# Patient Record
Sex: Female | Born: 1975
Health system: Southern US, Community
[De-identification: ages and names within clinical notes are randomized; demographics above are authoritative.]

## PROBLEM LIST (undated history)

## (undated) DIAGNOSIS — I10 Essential (primary) hypertension: Secondary | ICD-10-CM

## (undated) DIAGNOSIS — E119 Type 2 diabetes mellitus without complications: Secondary | ICD-10-CM

## (undated) DIAGNOSIS — E785 Hyperlipidemia, unspecified: Secondary | ICD-10-CM

## (undated) DIAGNOSIS — E559 Vitamin D deficiency, unspecified: Secondary | ICD-10-CM

## (undated) DIAGNOSIS — E739 Lactose intolerance, unspecified: Secondary | ICD-10-CM

## (undated) DIAGNOSIS — T7840XA Allergy, unspecified, initial encounter: Secondary | ICD-10-CM

## (undated) DIAGNOSIS — F32A Depression, unspecified: Secondary | ICD-10-CM

## (undated) DIAGNOSIS — Z86718 Personal history of other venous thrombosis and embolism: Secondary | ICD-10-CM

## (undated) HISTORY — DX: Allergy, unspecified, initial encounter: T78.40XA

## (undated) HISTORY — PX: OTHER SURGICAL HISTORY: SHX169

## (undated) HISTORY — DX: Type 2 diabetes mellitus without complications: E11.9

## (undated) HISTORY — DX: Essential (primary) hypertension: I10

## (undated) HISTORY — DX: Depression, unspecified: F32.A

## (undated) HISTORY — DX: Hyperlipidemia, unspecified: E78.5

## (undated) HISTORY — DX: Vitamin D deficiency, unspecified: E55.9

## (undated) HISTORY — DX: Lactose intolerance, unspecified: E73.9

## (undated) HISTORY — DX: Personal history of other venous thrombosis and embolism: Z86.718

---

## 1997-05-08 HISTORY — PX: OTHER SURGICAL HISTORY: SHX169

## 1997-08-11 ENCOUNTER — Other Ambulatory Visit: Admission: RE | Admit: 1997-08-11 | Discharge: 1997-08-11 | Payer: Self-pay | Admitting: *Deleted

## 1997-09-18 ENCOUNTER — Ambulatory Visit (HOSPITAL_COMMUNITY): Admission: RE | Admit: 1997-09-18 | Discharge: 1997-09-18 | Payer: Self-pay | Admitting: *Deleted

## 1997-11-16 ENCOUNTER — Emergency Department (HOSPITAL_COMMUNITY): Admission: EM | Admit: 1997-11-16 | Discharge: 1997-11-16 | Payer: Self-pay | Admitting: Internal Medicine

## 1997-12-10 ENCOUNTER — Emergency Department (HOSPITAL_COMMUNITY): Admission: EM | Admit: 1997-12-10 | Discharge: 1997-12-10 | Payer: Self-pay | Admitting: Emergency Medicine

## 1997-12-16 ENCOUNTER — Ambulatory Visit (HOSPITAL_COMMUNITY): Admission: RE | Admit: 1997-12-16 | Discharge: 1997-12-16 | Payer: Self-pay | Admitting: *Deleted

## 1998-01-30 ENCOUNTER — Inpatient Hospital Stay (HOSPITAL_COMMUNITY): Admission: AD | Admit: 1998-01-30 | Discharge: 1998-02-03 | Payer: Self-pay | Admitting: *Deleted

## 2000-01-17 ENCOUNTER — Emergency Department (HOSPITAL_COMMUNITY): Admission: EM | Admit: 2000-01-17 | Discharge: 2000-01-18 | Payer: Self-pay | Admitting: Emergency Medicine

## 2000-07-02 ENCOUNTER — Encounter: Payer: Self-pay | Admitting: Emergency Medicine

## 2000-07-02 ENCOUNTER — Emergency Department (HOSPITAL_COMMUNITY): Admission: EM | Admit: 2000-07-02 | Discharge: 2000-07-02 | Payer: Self-pay | Admitting: Emergency Medicine

## 2000-11-27 ENCOUNTER — Other Ambulatory Visit: Admission: RE | Admit: 2000-11-27 | Discharge: 2000-11-27 | Payer: Self-pay | Admitting: Obstetrics and Gynecology

## 2000-12-05 ENCOUNTER — Emergency Department (HOSPITAL_COMMUNITY): Admission: EM | Admit: 2000-12-05 | Discharge: 2000-12-05 | Payer: Self-pay | Admitting: Emergency Medicine

## 2001-01-01 ENCOUNTER — Emergency Department (HOSPITAL_COMMUNITY): Admission: EM | Admit: 2001-01-01 | Discharge: 2001-01-01 | Payer: Self-pay | Admitting: Internal Medicine

## 2001-12-24 ENCOUNTER — Other Ambulatory Visit: Admission: RE | Admit: 2001-12-24 | Discharge: 2001-12-24 | Payer: Self-pay | Admitting: Obstetrics and Gynecology

## 2002-04-21 ENCOUNTER — Emergency Department (HOSPITAL_COMMUNITY): Admission: EM | Admit: 2002-04-21 | Discharge: 2002-04-21 | Payer: Self-pay | Admitting: Emergency Medicine

## 2003-01-15 ENCOUNTER — Other Ambulatory Visit: Admission: RE | Admit: 2003-01-15 | Discharge: 2003-01-15 | Payer: Self-pay | Admitting: Obstetrics and Gynecology

## 2003-03-23 ENCOUNTER — Emergency Department (HOSPITAL_COMMUNITY): Admission: EM | Admit: 2003-03-23 | Discharge: 2003-03-23 | Payer: Self-pay | Admitting: Emergency Medicine

## 2003-05-04 ENCOUNTER — Other Ambulatory Visit: Admission: RE | Admit: 2003-05-04 | Discharge: 2003-05-04 | Payer: Self-pay | Admitting: Obstetrics and Gynecology

## 2003-09-08 ENCOUNTER — Other Ambulatory Visit: Admission: RE | Admit: 2003-09-08 | Discharge: 2003-09-08 | Payer: Self-pay | Admitting: Obstetrics and Gynecology

## 2007-01-12 ENCOUNTER — Emergency Department (HOSPITAL_COMMUNITY): Admission: EM | Admit: 2007-01-12 | Discharge: 2007-01-12 | Payer: Self-pay | Admitting: Emergency Medicine

## 2008-05-16 ENCOUNTER — Emergency Department (HOSPITAL_COMMUNITY): Admission: EM | Admit: 2008-05-16 | Discharge: 2008-05-16 | Payer: Self-pay | Admitting: Emergency Medicine

## 2009-01-21 ENCOUNTER — Emergency Department (HOSPITAL_COMMUNITY): Admission: EM | Admit: 2009-01-21 | Discharge: 2009-01-21 | Payer: Self-pay | Admitting: Emergency Medicine

## 2009-06-23 ENCOUNTER — Emergency Department (HOSPITAL_COMMUNITY): Admission: EM | Admit: 2009-06-23 | Discharge: 2009-06-23 | Payer: Self-pay | Admitting: Emergency Medicine

## 2010-07-27 LAB — URINALYSIS, ROUTINE W REFLEX MICROSCOPIC
Bilirubin Urine: NEGATIVE
Glucose, UA: NEGATIVE mg/dL
Ketones, ur: NEGATIVE mg/dL
Nitrite: NEGATIVE
pH: 6 (ref 5.0–8.0)

## 2010-07-27 LAB — PREGNANCY, URINE: Preg Test, Ur: NEGATIVE

## 2010-07-27 LAB — POCT I-STAT, CHEM 8
Calcium, Ion: 1.09 mmol/L — ABNORMAL LOW (ref 1.12–1.32)
HCT: 39 % (ref 36.0–46.0)
Hemoglobin: 13.3 g/dL (ref 12.0–15.0)
Sodium: 140 mEq/L (ref 135–145)
TCO2: 25 mmol/L (ref 0–100)

## 2010-07-27 LAB — URINE MICROSCOPIC-ADD ON

## 2010-10-03 ENCOUNTER — Emergency Department (HOSPITAL_COMMUNITY)
Admission: EM | Admit: 2010-10-03 | Discharge: 2010-10-03 | Disposition: A | Discharge status: Home / Self Care | Payer: Self-pay | Attending: Emergency Medicine | Admitting: Emergency Medicine

## 2010-10-03 DIAGNOSIS — M542 Cervicalgia: Secondary | ICD-10-CM | POA: Insufficient documentation

## 2010-10-03 DIAGNOSIS — M25519 Pain in unspecified shoulder: Secondary | ICD-10-CM | POA: Insufficient documentation

## 2010-10-03 DIAGNOSIS — I10 Essential (primary) hypertension: Secondary | ICD-10-CM | POA: Insufficient documentation

## 2010-11-07 ENCOUNTER — Other Ambulatory Visit: Payer: Self-pay | Admitting: Orthopedic Surgery

## 2010-11-07 DIAGNOSIS — M542 Cervicalgia: Secondary | ICD-10-CM

## 2010-11-12 ENCOUNTER — Ambulatory Visit
Admission: RE | Admit: 2010-11-12 | Discharge: 2010-11-12 | Disposition: A | Discharge status: Home / Self Care | Payer: 59 | Source: Ambulatory Visit | Attending: Orthopedic Surgery | Admitting: Orthopedic Surgery

## 2010-11-12 DIAGNOSIS — M542 Cervicalgia: Secondary | ICD-10-CM

## 2010-11-22 ENCOUNTER — Encounter (HOSPITAL_COMMUNITY)
Admission: RE | Admit: 2010-11-22 | Discharge: 2010-11-22 | Disposition: A | Discharge status: Home / Self Care | Payer: 59 | Source: Ambulatory Visit | Attending: Orthopedic Surgery | Admitting: Orthopedic Surgery

## 2010-11-22 ENCOUNTER — Other Ambulatory Visit (HOSPITAL_COMMUNITY): Payer: Self-pay | Admitting: Orthopedic Surgery

## 2010-11-22 DIAGNOSIS — M79602 Pain in left arm: Secondary | ICD-10-CM

## 2010-11-22 LAB — COMPREHENSIVE METABOLIC PANEL
ALT: 30 U/L (ref 0–35)
AST: 18 U/L (ref 0–37)
Albumin: 4.2 g/dL (ref 3.5–5.2)
CO2: 24 mEq/L (ref 19–32)
Calcium: 9.9 mg/dL (ref 8.4–10.5)
Creatinine, Ser: 0.66 mg/dL (ref 0.50–1.10)
GFR calc non Af Amer: >60 mL/min (ref >60)
Sodium: 141 mEq/L (ref 135–145)
Total Protein: 7.5 g/dL (ref 6.0–8.3)

## 2010-11-22 LAB — URINALYSIS, ROUTINE W REFLEX MICROSCOPIC
Ketones, ur: 15 mg/dL — AB
Protein, ur: 30 mg/dL — AB
Urobilinogen, UA: 1 mg/dL (ref 0.0–1.0)

## 2010-11-22 LAB — DIFFERENTIAL
Basophils Absolute: 0 10*3/uL (ref 0.0–0.1)
Eosinophils Absolute: 0.1 10*3/uL (ref 0.0–0.7)
Lymphocytes Relative: 35 % (ref 12–46)
Lymphs Abs: 4.5 10*3/uL — ABNORMAL HIGH (ref 0.7–4.0)
Monocytes Relative: 8 % (ref 3–12)

## 2010-11-22 LAB — CBC
MCH: 30 pg (ref 26.0–34.0)
MCV: 86.7 fL (ref 78.0–100.0)
Platelets: 437 10*3/uL — ABNORMAL HIGH (ref 150–400)
RBC: 4.37 MIL/uL (ref 3.87–5.11)
RDW: 13.2 % (ref 11.5–15.5)
WBC: 12.8 10*3/uL — ABNORMAL HIGH (ref 4.0–10.5)

## 2010-11-22 LAB — APTT: aPTT: 27 seconds (ref 24–37)

## 2010-11-22 LAB — SURGICAL PCR SCREEN: Staphylococcus aureus: NEGATIVE

## 2010-11-22 LAB — URINE MICROSCOPIC-ADD ON

## 2010-11-22 LAB — PROTIME-INR
INR: 1 (ref 0.00–1.49)
Prothrombin Time: 13.4 seconds (ref 11.6–15.2)

## 2010-12-07 ENCOUNTER — Observation Stay (HOSPITAL_COMMUNITY)
Admission: RE | Admit: 2010-12-07 | Discharge: 2010-12-08 | DRG: 473 | Disposition: A | Discharge status: Home / Self Care | Payer: 59 | Source: Ambulatory Visit | Attending: Orthopedic Surgery | Admitting: Orthopedic Surgery

## 2010-12-07 ENCOUNTER — Ambulatory Visit (HOSPITAL_COMMUNITY): Payer: 59

## 2010-12-07 DIAGNOSIS — I10 Essential (primary) hypertension: Secondary | ICD-10-CM | POA: Insufficient documentation

## 2010-12-07 DIAGNOSIS — Z01818 Encounter for other preprocedural examination: Secondary | ICD-10-CM | POA: Insufficient documentation

## 2010-12-07 DIAGNOSIS — M5030 Other cervical disc degeneration, unspecified cervical region: Principal | ICD-10-CM | POA: Insufficient documentation

## 2010-12-07 DIAGNOSIS — Z01812 Encounter for preprocedural laboratory examination: Secondary | ICD-10-CM | POA: Insufficient documentation

## 2010-12-07 DIAGNOSIS — E669 Obesity, unspecified: Secondary | ICD-10-CM | POA: Insufficient documentation

## 2010-12-07 DIAGNOSIS — Z01810 Encounter for preprocedural cardiovascular examination: Secondary | ICD-10-CM | POA: Insufficient documentation

## 2010-12-07 HISTORY — PX: OTHER SURGICAL HISTORY: SHX169

## 2010-12-07 LAB — CBC
HCT: 35.8 % — ABNORMAL LOW (ref 36.0–46.0)
Hemoglobin: 12.3 g/dL (ref 12.0–15.0)
MCH: 29.5 pg (ref 26.0–34.0)
MCV: 85.9 fL (ref 78.0–100.0)
Platelets: 408 10*3/uL — ABNORMAL HIGH (ref 150–400)
RBC: 4.17 MIL/uL (ref 3.87–5.11)
WBC: 10 10*3/uL (ref 4.0–10.5)

## 2010-12-07 LAB — DIFFERENTIAL
Eosinophils Absolute: 0.2 10*3/uL (ref 0.0–0.7)
Lymphocytes Relative: 48 % — ABNORMAL HIGH (ref 12–46)
Lymphs Abs: 4.9 10*3/uL — ABNORMAL HIGH (ref 0.7–4.0)
Monocytes Relative: 5 % (ref 3–12)
Neutro Abs: 4.4 10*3/uL (ref 1.7–7.7)
Neutrophils Relative %: 44 % (ref 43–77)

## 2010-12-07 LAB — COMPREHENSIVE METABOLIC PANEL
ALT: 22 U/L (ref 0–35)
AST: 15 U/L (ref 0–37)
Albumin: 3.9 g/dL (ref 3.5–5.2)
Calcium: 9.6 mg/dL (ref 8.4–10.5)
Chloride: 108 mEq/L (ref 96–112)
Creatinine, Ser: 0.56 mg/dL (ref 0.50–1.10)
Sodium: 142 mEq/L (ref 135–145)

## 2010-12-07 LAB — PROTIME-INR: INR: 1 (ref 0.00–1.49)

## 2010-12-07 LAB — URINALYSIS, ROUTINE W REFLEX MICROSCOPIC
Bilirubin Urine: NEGATIVE
Glucose, UA: NEGATIVE mg/dL
Ketones, ur: NEGATIVE mg/dL
Nitrite: NEGATIVE
Specific Gravity, Urine: 1.024 (ref 1.005–1.030)
pH: 7.5 (ref 5.0–8.0)

## 2010-12-07 LAB — APTT: aPTT: 29 seconds (ref 24–37)

## 2010-12-07 LAB — TYPE AND SCREEN: Antibody Screen: NEGATIVE

## 2010-12-07 LAB — URINE MICROSCOPIC-ADD ON

## 2010-12-13 NOTE — Discharge Summary (Signed)
  NAMECRYSTIE, YANKO NO.:  [id number]  MEDICAL RECORD NO.:  [id number]  LOCATION:  3524                         FACILITY:  [facility]  PHYSICIAN:  Estill Bamberg, MD      DATE OF BIRTH:  03-Dec-1975  DATE OF ADMISSION:  12/07/2010 DATE OF DISCHARGE:  12/08/2010                              DISCHARGE SUMMARY   ADMISSION DIAGNOSIS:  Left-sided T6 radiculopathy.  DISCHARGE DIAGNOSIS:  Left-sided T6 radiculopathy.  ADMISSION HISTORY:  Briefly, Ms. Winburn is a very pleasant 35 year old female who presented to my office with severe debilitating pain in her left arm.  I reviewed an MRI which was consistent with neural foraminal stenosis on the left side at the C5-C6 level.  We did go forward with nonoperative measures but the patient continued to have severe debilitating pain on the left side.  We therefore did admit the patient on December 07, 2010 for the procedure noted above.  HOSPITAL COURSE:  The patient was admitted and underwent the procedure noted above.  She tolerated the procedure well and was transferred to recovery in stable condition.  A hard cervical collar was placed postoperatively which was maintained throughout the patient's hospital stay.  The patient was neurovascularly intact throughout her hospital stay as well.  I did evaluate the patient on the morning of postoperative day #1, at which point her pain was well-controlled.  Her left upper extremity radiculopathy was entirely resolved at that point in time and it was felt the patient would be safe for discharge home.  DISCHARGE INSTRUCTIONS:  The patient will take Percocet for pain and Valium for spasms.  She will be maintained in her hard cervical collar for a total of 2 weeks.  I will see her back in my office at approximately 2 weeks.  The patient was educated on the signs and symptoms of infection and will contact my office should any of those symptoms or signs acquire themselves.     Estill Bamberg, MD     MD/MEDQ  D:  12/08/2010  T:  12/08/2010  Job:  [id number]  Electronically Signed by Estill Bamberg  on 12/13/2010 05:44:26 PM

## 2010-12-13 NOTE — Op Note (Signed)
NAMEADISSON, Crystal Wallace NO.:  [id number]  MEDICAL RECORD NO.:  [id number]  LOCATION:  3524                         FACILITY:  [facility]  PHYSICIAN:  Estill Bamberg, MD      DATE OF BIRTH:  08-04-1975  DATE OF PROCEDURE:  12/07/2010 DATE OF DISCHARGE:                              OPERATIVE REPORT   PREOPERATIVE DIAGNOSIS:  Left-sided cervical radiculopathy on excess C5- 6 degenerative disk disease with left-sided C5-6 neural foraminal stenosis.  POSTOPERATIVE DIAGNOSIS:  Left-sided cervical radiculopathy on excess C5- 6 degenerative disk disease with left-sided C5-6 neural foraminal stenosis.  PROCEDURES: 1. Anterior cervical decompression and fusion C5-6. 2. Placement of anterior instrumentation. 3. Insertion of interbody device x1 (titin 8-mm small interbody     spacer). 4. Use of local autograft. 5. Intraoperative use of fluoroscopy.  SURGEON:  Estill Bamberg, MD  ASSISTANT:  Janace Litten, OPAANESTHESIA:  General endotracheal anesthesia.  COMPLICATIONS:  None.  DISPOSITION:  Stable.  INDICATIONS FOR PROCEDURE:  Briefly, Crystal Wallace is a very pleasant 35 year old female who presented to me with severe and debilitating pain in her left arm.  I did review an MRI of her cervical spine which is notable for a left-sided C5-6 disk herniation causing deflection of the spinal cord and obvious compression of the exiting C6 nerve.  We did initially go forward with conservative care including a Medrol Dosepak and physical therapy.  She returned to me stating that her pain continued. She continued to have ongoing and severe pain in the left arm.  Given the spinal cord compression identified and her ongoing left-sided arm pain, we did have a discussion regarding going forward with a C5-6 anterior cervical decompression and fusion.  The patient fully understood the risks and limitations of the procedure as outlined in my preoperative note.  OPERATIVE DETAILS:  On  December 07, 2010, the patient was brought to surgery and general endotracheal anesthesia was administered.  The patient was placed supine on a well-padded hospital bed.  Ancef was given for antibiotic prophylaxis.  SCDs were placed.  Time-out procedure was performed.  I did obtain a lateral fluoroscopic view to help optimize the location of my incision prior to prepping the neck.  The neck was then prepped and draped in the usual sterile fashion.  The neck was placed in a gentle degree of extension.  I then made a left-sided transverse incision from the midline to the medial border of the sternocleidomastoid muscle.  The patient did have a rather thick neck and I did take my time in identifying the plane between the sternocleidomastoid muscle laterally and the strap muscles medially. The plane was identified and explored and the anterior cervical spine was readily noted.  I then obtained a lateral fluoroscopic intraoperative view with an 18-gauge needle and the C5-6 interspace. This did confirm the appropriate level.  I then subperiosteally exposed the vertebral bodies of C5 and C6 and a self-retaining Shadow-Line retractor was placed.  I then used a 15 blade knife to perform annulotomy.  I then used a Cobb to perform to obtain distraction across the intervertebral space.  I then performed a diskectomy in the usual manner using  a series of curettes and Kerrison punches.  I then encountered the posterolateral longitudinal ligament which was entered using a nerve hook.  I then used a #1 and then a #2 Kerrison to gain access to the posterolateral longitudinal ligament out to the left neural foramen.  A total of 2 rather large disk herniations into clear themselves.  These were thought to be previously compressing the exiting C6 nerve.  At the termination of the decompression, I was easily able to pass a nerve hook out the left-sided neural foramen.  I then turned my attention towards repairing  the endplates.  I did use a rasp to even out the endplates above and below.  Of note, I did place a Caspar pins in the C5 and C6 vertebral bodies to help gain distraction across the intervertebral space.  At this point, I placed a series of trials and I did feel the lordotic 8-mm intervertebral graft to be the most appropriate fit.  I then selected an 8-mm intervertebral tightening graft and demineralized bone matrix from the musculoskeletal transplant foundation was packed into the intervertebral device.  This was tamped into position in the usual fashion.  I did do this under live fluoroscopy and did note excellent press fit and placement of the graft. I then removed distraction.  I then selected a 14-mm Synthes fracture plate and this was placed over the anterior cervical spine.  I then placed a total of 2 self-drilling, self-tapping 60-mm screws into the vertebral bodies of C5 and 2 additional screws into the vertebral bodies of C6.  Again, AP and lateral fluoroscopy was used to help optimize the location of the screws.  I did note an excellent press fit with each screw.  At this point, I copiously irrigated the wound.  I closed the platysma and deep subcutaneous layer with 2-0 Vicryl.  Superficial subcutaneous layer was closed using 2-0 Vicryl as well.  The skin was then closed using 3-0 Monocryl.  The patient was then awoken from general endotracheal anesthesia and a hard cervical collar was placed. The patient was then transferred to recovery in stable condition.  All instrument counts were correct at the termination of the procedure.  Of note, I did remove bone spurs at the anterior intervertebral spaces and this was also packed into the intervertebral device to help optimize the fusion success.  Of note, Janace Litten was my assistant throughout the procedure and aided in essential retraction and suctioning required throughout the surgery.     Estill Bamberg,  MD     MD/MEDQ  D:  12/07/2010  T:  12/08/2010  Job:  [id number]  Electronically Signed by Estill Bamberg  on 12/13/2010 05:44:23 PM

## 2011-02-17 LAB — POCT PREGNANCY, URINE
Operator id: 257131
Preg Test, Ur: NEGATIVE

## 2011-02-17 LAB — I-STAT 8, (EC8 V) (CONVERTED LAB)
BUN: 8
Bicarbonate: 20.3
Chloride: 108
HCT: 39
Hemoglobin: 13.3
Operator id: 196461
Potassium: 3.4 — ABNORMAL LOW
Sodium: 138

## 2011-02-17 LAB — URINALYSIS, ROUTINE W REFLEX MICROSCOPIC
Glucose, UA: NEGATIVE
Leukocytes, UA: NEGATIVE
Protein, ur: 100 — AB

## 2011-02-17 LAB — URINE MICROSCOPIC-ADD ON

## 2011-02-17 LAB — SAMPLE TO BLOOD BANK

## 2011-02-17 LAB — RAPID URINE DRUG SCREEN, HOSP PERFORMED: Benzodiazepines: NOT DETECTED

## 2011-10-09 ENCOUNTER — Other Ambulatory Visit: Payer: Self-pay | Admitting: Obstetrics & Gynecology

## 2011-10-12 ENCOUNTER — Encounter (HOSPITAL_COMMUNITY): Payer: Self-pay | Admitting: Pharmacy Technician

## 2011-10-16 ENCOUNTER — Encounter (HOSPITAL_COMMUNITY): Payer: Self-pay

## 2011-10-16 ENCOUNTER — Encounter (HOSPITAL_COMMUNITY)
Admission: RE | Admit: 2011-10-16 | Discharge: 2011-10-16 | Disposition: A | Discharge status: Home / Self Care | Payer: 59 | Source: Ambulatory Visit | Attending: Obstetrics & Gynecology | Admitting: Obstetrics & Gynecology

## 2011-10-16 LAB — CBC
HCT: 32 % — ABNORMAL LOW (ref 36.0–46.0)
Hemoglobin: 10.3 g/dL — ABNORMAL LOW (ref 12.0–15.0)
MCHC: 32.2 g/dL (ref 30.0–36.0)
MCV: 87.2 fL (ref 78.0–100.0)
RDW: 13.3 % (ref 11.5–15.5)

## 2011-10-16 LAB — ABO/RH: ABO/RH(D): B POS

## 2011-10-16 NOTE — Patient Instructions (Signed)
20 RONNAE KASER  10/16/2011   Your procedure is scheduled on:  10-20-2011  Report to [facility] a 0845t  AM.  Call this number if you have problems the morning of surgery: [phone]   Remember:   Do not eat food or drink liquids:After Midnight.  .  Take these medicines the morning of surgery with A SIP OF WATER: no meds to take   Do not wear jewelry or make up.  Do not wear lotions, powders, or perfumes.Do not wear deodorant.    Do not bring valuables to the hospital.  Contacts, dentures or bridgework may not be worn into surgery.  Leave suitcase in the car. After surgery it may be brought to your room.  For patients admitted to the hospital, checkout time is 11:00 AM the day of              discharge.     Special Instructions: CHG Shower Use Special Wash: 1/2 bottle night before surgery and 1/2 bottle morning of surgery, use regular soap on face and front and back private area. Do not shave for 2 days before showers   Please read over the following fact sheets that you were given: MRSA Information, blood fact sheet  C[facility] pre op nurse phone number [phone], call if needed

## 2011-10-16 NOTE — Pre-Procedure Instructions (Signed)
Chest 2 view xray 11-12-2010 epic ekg 11-22-2010 epic

## 2011-10-18 ENCOUNTER — Encounter (HOSPITAL_COMMUNITY): Payer: Self-pay | Admitting: Obstetrics & Gynecology

## 2011-10-18 NOTE — H&P (Signed)
  Subjective:  Crystal Wallace is a 36 y.o. female who presents for a robotic-assisted myomectomy. The patient has a h/o heavy, painful menses.  Evaluation to date: pelvic ultrasound: positive for a dominant, intramural myoma--6 cm;; ?impingement on endometrial lining. Treatment to date: Mirena IUD  Pertinent Gyn History:  Menses flow is moderate Bleeding: cyclic Contraception: IUD STDs: recent diagnosis: Trichomoniasis Last pap: normal Date: 4/13  Patient Active Problem List   Diagnosis Date Noted  . Leiomyoma of uterus, unspecified 10/18/2011   Past Medical History  Diagnosis Date  . Uterine fibroid     Past Surgical History  Procedure Date  . Cervical 5 and cervical 6 neck surgery 12-07-2010  .  c section 1999  . Adenoid removal age 86     No prescriptions prior to admission   No Known Allergies  History  Substance Use Topics  . Smoking status: Never Smoker   . Smokeless tobacco: Never Used  . Alcohol Use: No    History reviewed. No pertinent family history.   Review of Systems Pertinent items are noted in HPI.    Objective:   Vital signs in last 24 hours:    General:   alert  Skin:   normal  HEENT:  PERRLA  Lungs:   clear to auscultation bilaterally  Heart:   regular rate and rhythm, S1, S2 normal, no murmur, click, rub or gallop  Breasts:   normal without suspicious masses, skin or nipple changes or axillary nodes  Abdomen:  soft, non-tender; bowel sounds normal; no masses,  no organomegaly  Pelvis:  Vaginal: normal mucosa without prolapse or lesions Cervix: normal appearance Adnexa: normal bimanual exam Uterus: limited Exam limited by body habitus                                     Assessment/Plan:  Symptomatic fibroid uterus  A robotic-assisted myomectomy is planned.  Procedure, risks, reasons, benefits and complications (including injury to bowel, bladder, major blood vessel, ureter, bleeding, possibility of transfusion, infection, or fistula  formation) were reviewed in detail. Consent was signed and preop testing was ordered.  Instructions were reviewed, including NPO after midnight.

## 2011-10-20 ENCOUNTER — Ambulatory Visit (HOSPITAL_COMMUNITY)
Admission: RE | Admit: 2011-10-20 | Discharge: 2011-10-21 | Disposition: A | Discharge status: Home / Self Care | Payer: 59 | Source: Ambulatory Visit | Attending: Obstetrics & Gynecology | Admitting: Obstetrics & Gynecology

## 2011-10-20 ENCOUNTER — Ambulatory Visit (HOSPITAL_COMMUNITY): Payer: 59 | Admitting: *Deleted

## 2011-10-20 ENCOUNTER — Encounter (HOSPITAL_COMMUNITY): Payer: Self-pay | Admitting: *Deleted

## 2011-10-20 ENCOUNTER — Encounter (HOSPITAL_COMMUNITY)
Admission: RE | Disposition: A | Discharge status: Home / Self Care | Payer: Self-pay | Source: Ambulatory Visit | Attending: Obstetrics & Gynecology

## 2011-10-20 DIAGNOSIS — D251 Intramural leiomyoma of uterus: Principal | ICD-10-CM | POA: Insufficient documentation

## 2011-10-20 DIAGNOSIS — D259 Leiomyoma of uterus, unspecified: Secondary | ICD-10-CM

## 2011-10-20 DIAGNOSIS — Z01812 Encounter for preprocedural laboratory examination: Secondary | ICD-10-CM | POA: Insufficient documentation

## 2011-10-20 HISTORY — PX: ROBOT ASSISTED MYOMECTOMY: SHX5142

## 2011-10-20 LAB — TYPE AND SCREEN: ABO/RH(D): B POS

## 2011-10-20 SURGERY — ROBOTIC ASSISTED MYOMECTOMY
Anesthesia: General | Wound class: Clean

## 2011-10-20 MED ORDER — KCL IN DEXTROSE-NACL 20-5-0.45 MEQ/L-%-% IV SOLN
INTRAVENOUS | Status: AC
  Filled 2011-10-20: qty 1000

## 2011-10-20 MED ORDER — BUPIVACAINE HCL (PF) 0.25 % IJ SOLN
INTRAMUSCULAR | Status: DC | PRN
  Administered 2011-10-20: 8 mL

## 2011-10-20 MED ORDER — VASOPRESSIN 20 UNIT/ML IJ SOLN
INTRAMUSCULAR | Status: AC
  Filled 2011-10-20: qty 2

## 2011-10-20 MED ORDER — FENTANYL CITRATE 0.05 MG/ML IJ SOLN
INTRAMUSCULAR | Status: DC | PRN
  Administered 2011-10-20: 50 ug via INTRAVENOUS
  Administered 2011-10-20: 100 ug via INTRAVENOUS
  Administered 2011-10-20 (×5): 50 ug via INTRAVENOUS
  Administered 2011-10-20 (×4): 25 ug via INTRAVENOUS
  Administered 2011-10-20 (×2): 50 ug via INTRAVENOUS

## 2011-10-20 MED ORDER — HYDROMORPHONE HCL PF 1 MG/ML IJ SOLN: 0.2500 mg | INTRAMUSCULAR | Status: DC | PRN

## 2011-10-20 MED ORDER — STERILE WATER FOR IRRIGATION IR SOLN
Status: DC | PRN
  Administered 2011-10-20: 3000 mL

## 2011-10-20 MED ORDER — MIDAZOLAM HCL 5 MG/5ML IJ SOLN
INTRAMUSCULAR | Status: DC | PRN
  Administered 2011-10-20: 2 mg via INTRAVENOUS

## 2011-10-20 MED ORDER — PROMETHAZINE HCL 25 MG/ML IJ SOLN: 6.2500 mg | INTRAMUSCULAR | Status: DC | PRN

## 2011-10-20 MED ORDER — ONDANSETRON HCL 4 MG PO TABS: 4.0000 mg | ORAL_TABLET | Freq: Four times a day (QID) | ORAL | Status: DC | PRN

## 2011-10-20 MED ORDER — PROPOFOL 10 MG/ML IV EMUL
INTRAVENOUS | Status: DC | PRN
  Administered 2011-10-20: 200 mg via INTRAVENOUS

## 2011-10-20 MED ORDER — LACTATED RINGERS IR SOLN
Status: DC | PRN
  Administered 2011-10-20 (×2): 1000 mL

## 2011-10-20 MED ORDER — MORPHINE SULFATE 2 MG/ML IJ SOLN
2.0000 mg | INTRAMUSCULAR | Status: DC | PRN
  Administered 2011-10-20: 2 mg via INTRAVENOUS
  Filled 2011-10-20: qty 1

## 2011-10-20 MED ORDER — NEOSTIGMINE METHYLSULFATE 1 MG/ML IJ SOLN
INTRAMUSCULAR | Status: DC | PRN
  Administered 2011-10-20: 3 mg via INTRAVENOUS

## 2011-10-20 MED ORDER — LACTATED RINGERS IV SOLN: INTRAVENOUS | Status: DC

## 2011-10-20 MED ORDER — ACETAMINOPHEN 10 MG/ML IV SOLN
INTRAVENOUS | Status: DC | PRN
  Administered 2011-10-20: 1000 mg via INTRAVENOUS

## 2011-10-20 MED ORDER — ONDANSETRON HCL 4 MG/2ML IJ SOLN
INTRAMUSCULAR | Status: DC | PRN
  Administered 2011-10-20: 4 mg via INTRAVENOUS

## 2011-10-20 MED ORDER — ROCURONIUM BROMIDE 100 MG/10ML IV SOLN
INTRAVENOUS | Status: DC | PRN
  Administered 2011-10-20 (×2): 10 mg via INTRAVENOUS
  Administered 2011-10-20: 20 mg via INTRAVENOUS
  Administered 2011-10-20: 10 mg via INTRAVENOUS
  Administered 2011-10-20: 20 mg via INTRAVENOUS
  Administered 2011-10-20: 10 mg via INTRAVENOUS
  Administered 2011-10-20 (×2): 20 mg via INTRAVENOUS
  Administered 2011-10-20: 50 mg via INTRAVENOUS
  Administered 2011-10-20: 10 mg via INTRAVENOUS

## 2011-10-20 MED ORDER — PHENYLEPHRINE HCL 10 MG/ML IJ SOLN
10.0000 mg | INTRAVENOUS | Status: DC | PRN
  Administered 2011-10-20: 50 ug/min via INTRAVENOUS

## 2011-10-20 MED ORDER — METOCLOPRAMIDE HCL 5 MG/ML IJ SOLN
INTRAMUSCULAR | Status: DC | PRN
  Administered 2011-10-20: 10 mg via INTRAVENOUS

## 2011-10-20 MED ORDER — DEXAMETHASONE SODIUM PHOSPHATE 4 MG/ML IJ SOLN
INTRAMUSCULAR | Status: DC | PRN
  Administered 2011-10-20: 10 mg via INTRAVENOUS

## 2011-10-20 MED ORDER — BUPIVACAINE HCL (PF) 0.25 % IJ SOLN
INTRAMUSCULAR | Status: AC
  Filled 2011-10-20: qty 30

## 2011-10-20 MED ORDER — HETASTARCH-ELECTROLYTES 6 % IV SOLN
INTRAVENOUS | Status: DC | PRN
  Administered 2011-10-20: 14:00:00 via INTRAVENOUS

## 2011-10-20 MED ORDER — CEFAZOLIN SODIUM-DEXTROSE 2-3 GM-% IV SOLR
2.0000 g | INTRAVENOUS | Status: AC
  Administered 2011-10-20: 2 g via INTRAVENOUS

## 2011-10-20 MED ORDER — EPHEDRINE SULFATE 50 MG/ML IJ SOLN
INTRAMUSCULAR | Status: DC | PRN
  Administered 2011-10-20 (×3): 5 mg via INTRAVENOUS
  Administered 2011-10-20: 10 mg via INTRAVENOUS
  Administered 2011-10-20 (×3): 5 mg via INTRAVENOUS

## 2011-10-20 MED ORDER — ONDANSETRON HCL 4 MG/2ML IJ SOLN
4.0000 mg | Freq: Four times a day (QID) | INTRAMUSCULAR | Status: DC | PRN
  Administered 2011-10-21: 4 mg via INTRAVENOUS
  Filled 2011-10-20: qty 2

## 2011-10-20 MED ORDER — GLYCOPYRROLATE 0.2 MG/ML IJ SOLN
INTRAMUSCULAR | Status: DC | PRN
  Administered 2011-10-20: 0.4 mg via INTRAVENOUS

## 2011-10-20 MED ORDER — CEFAZOLIN SODIUM-DEXTROSE 2-3 GM-% IV SOLR
INTRAVENOUS | Status: AC
  Filled 2011-10-20: qty 50

## 2011-10-20 MED ORDER — OXYCODONE-ACETAMINOPHEN 5-325 MG PO TABS
1.0000 | ORAL_TABLET | ORAL | Status: DC | PRN
  Administered 2011-10-20 (×2): 2 via ORAL
  Filled 2011-10-20 (×2): qty 2

## 2011-10-20 MED ORDER — LIDOCAINE HCL (CARDIAC) 20 MG/ML IV SOLN
INTRAVENOUS | Status: DC | PRN
  Administered 2011-10-20: 50 mg via INTRAVENOUS

## 2011-10-20 MED ORDER — ACETAMINOPHEN 10 MG/ML IV SOLN
INTRAVENOUS | Status: AC
  Filled 2011-10-20: qty 100

## 2011-10-20 MED ORDER — KCL IN DEXTROSE-NACL 20-5-0.45 MEQ/L-%-% IV SOLN
INTRAVENOUS | Status: DC
  Administered 2011-10-20 – 2011-10-21 (×2): via INTRAVENOUS
  Administered 2011-10-21: 125 mL/h via INTRAVENOUS
  Filled 2011-10-20 (×4): qty 1000

## 2011-10-20 MED ORDER — ESMOLOL HCL 10 MG/ML IV SOLN
INTRAVENOUS | Status: DC | PRN
  Administered 2011-10-20 (×2): 20 mg via INTRAVENOUS

## 2011-10-20 MED ORDER — PHENYLEPHRINE HCL 10 MG/ML IJ SOLN
INTRAMUSCULAR | Status: DC | PRN
  Administered 2011-10-20 (×2): 80 ug via INTRAVENOUS

## 2011-10-20 MED ORDER — VASOPRESSIN 20 UNIT/ML IJ SOLN
INTRAMUSCULAR | Status: DC | PRN
  Administered 2011-10-20: 20 [IU]
  Administered 2011-10-20: 5 [IU]

## 2011-10-20 MED ORDER — LACTATED RINGERS IV SOLN
INTRAVENOUS | Status: DC
  Administered 2011-10-20: 1000 mL via INTRAVENOUS
  Administered 2011-10-20 (×2): via INTRAVENOUS

## 2011-10-20 MED ORDER — ZOLPIDEM TARTRATE 5 MG PO TABS: 5.0000 mg | ORAL_TABLET | Freq: Every evening | ORAL | Status: DC | PRN

## 2011-10-20 MED ORDER — DIAZEPAM 5 MG PO TABS: 5.0000 mg | ORAL_TABLET | Freq: Every evening | ORAL | Status: DC | PRN

## 2011-10-20 MED ORDER — HYDROMORPHONE HCL PF 1 MG/ML IJ SOLN
INTRAMUSCULAR | Status: DC | PRN
  Administered 2011-10-20 (×4): 0.5 mg via INTRAVENOUS
  Administered 2011-10-20: 1 mg via INTRAVENOUS

## 2011-10-20 MED ORDER — KETAMINE HCL 10 MG/ML IJ SOLN
INTRAMUSCULAR | Status: DC | PRN
  Administered 2011-10-20 (×2): 20 mg via INTRAVENOUS
  Administered 2011-10-20: 10 mg via INTRAVENOUS

## 2011-10-20 MED ORDER — MEPERIDINE HCL 50 MG/ML IJ SOLN: 6.2500 mg | INTRAMUSCULAR | Status: DC | PRN

## 2011-10-20 SURGICAL SUPPLY — 69 items
ADH SKN CLS APL DERMABOND .7 (GAUZE/BANDAGES/DRESSINGS) ×1
APL ESCP 34 STRL LF DISP (HEMOSTASIS) ×1
APPLICATOR SURGIFLO ENDO (HEMOSTASIS) ×1 IMPLANT
BAG SPEC RTRVL LRG 6X4 10 (ENDOMECHANICALS) ×1
BARRIER ADHS 3X4 INTERCEED (GAUZE/BANDAGES/DRESSINGS) ×2 IMPLANT
BLADE LAPAROSCOPIC MORCELL KIT (BLADE) ×1 IMPLANT
BRR ADH 4X3 ABS CNTRL BYND (GAUZE/BANDAGES/DRESSINGS)
BRR ADH 5X3 SEPRAFILM 6 SHT (MISCELLANEOUS) ×1
CLOTH BEACON ORANGE TIMEOUT ST (SAFETY) ×2 IMPLANT
CORDS BIPOLAR (ELECTRODE) ×2 IMPLANT
COVER MAYO STAND STRL (DRAPES) ×2 IMPLANT
COVER SURGICAL LIGHT HANDLE (MISCELLANEOUS) ×2 IMPLANT
COVER TIP SHEARS 8 DVNC (MISCELLANEOUS) ×1 IMPLANT
COVER TIP SHEARS 8MM DA VINCI (MISCELLANEOUS) ×1
DECANTER SPIKE VIAL GLASS SM (MISCELLANEOUS) ×1 IMPLANT
DERMABOND ADVANCED (GAUZE/BANDAGES/DRESSINGS) ×1
DERMABOND ADVANCED .7 DNX12 (GAUZE/BANDAGES/DRESSINGS) ×2 IMPLANT
DRAPE LG THREE QUARTER DISP (DRAPES) ×4 IMPLANT
DRAPE SURG IRRIG POUCH 19X23 (DRAPES) ×2 IMPLANT
DRAPE TABLE BACK 44X90 PK DISP (DRAPES) ×4 IMPLANT
DRAPE WARM FLUID 44X44 (DRAPE) ×2 IMPLANT
ELECT REM PT RETURN 9FT ADLT (ELECTROSURGICAL) ×2
ELECTRODE REM PT RTRN 9FT ADLT (ELECTROSURGICAL) ×1 IMPLANT
FILTER SMOKE EVAC LAPAROSHD (FILTER) ×2 IMPLANT
GLOVE BIO SURGEON STRL SZ 6.5 (GLOVE) ×2 IMPLANT
GLOVE SS BIOGEL STRL SZ 8 (GLOVE) ×2 IMPLANT
GLOVE SUPERSENSE BIOGEL SZ 8 (GLOVE) ×2
GOWN STRL NON-REIN LRG LVL3 (GOWN DISPOSABLE) ×8 IMPLANT
IV LACTATED RINGER IRRG 3000ML (IV SOLUTION) ×2
IV LR IRRIG 3000ML ARTHROMATIC (IV SOLUTION) ×1 IMPLANT
KIT ACCESSORY DA VINCI DISP (KITS) ×1
KIT ACCESSORY DVNC DISP (KITS) ×1 IMPLANT
LUBRICANT JELLY K Y 4OZ (MISCELLANEOUS) ×1 IMPLANT
MANIPULATOR UTERINE 4.5 ZUMI (MISCELLANEOUS) ×2 IMPLANT
NEEDLE SPNL 22GX9.0 ACCUTG (NEEDLE) ×2 IMPLANT
OCCLUDER COLPOPNEUMO (BALLOONS) IMPLANT
PACK LAPAROSCOPY W LONG (CUSTOM PROCEDURE TRAY) ×2 IMPLANT
PENCIL BUTTON HOLSTER BLD 10FT (ELECTRODE) ×2 IMPLANT
POSITIONER SURGICAL ARM (MISCELLANEOUS) ×3 IMPLANT
POUCH SPECIMEN RETRIEVAL 10MM (ENDOMECHANICALS) ×1 IMPLANT
SEPRAFILM PROCEDURAL PACK 3X5 (MISCELLANEOUS) ×1 IMPLANT
SET TUBE IRRIG SUCTION NO TIP (IRRIGATION / IRRIGATOR) ×2 IMPLANT
SHEET LAVH (DRAPES) ×2 IMPLANT
SOLUTION ELECTROLUBE (MISCELLANEOUS) ×2 IMPLANT
SUT MNCRL AB 4-0 PS2 18 (SUTURE) ×4 IMPLANT
SUT V-LOC BARB 180 2/0GR6 GS22 (SUTURE) ×6
SUT VIC AB 0 CT1 27 (SUTURE)
SUT VIC AB 0 CT1 27XBRD ANTBC (SUTURE) IMPLANT
SUT VIC AB 0 CT2 27 (SUTURE) ×4 IMPLANT
SUT VIC AB 2-0 CT2 27 (SUTURE) ×6 IMPLANT
SUT VIC AB 2-0 SH 27 (SUTURE) ×2
SUT VIC AB 2-0 SH 27X BRD (SUTURE) IMPLANT
SUT VIC AB 3-0 SH 27 (SUTURE) ×14
SUT VIC AB 3-0 SH 27XBRD (SUTURE) IMPLANT
SUT VICRYL 0 UR6 27IN ABS (SUTURE) ×3 IMPLANT
SUTURE V-LC BRB 180 2/0GR6GS22 (SUTURE) IMPLANT
SYR 20CC LL (SYRINGE) ×3 IMPLANT
SYR 50ML LL SCALE MARK (SYRINGE) ×2 IMPLANT
SYR BULB IRRIGATION 50ML (SYRINGE) ×1 IMPLANT
SYR CONTROL 10ML LL (SYRINGE) ×1 IMPLANT
TOWEL OR 17X26 10 PK STRL BLUE (TOWEL DISPOSABLE) ×2 IMPLANT
TRAY FOLEY CATH 14FRSI W/METER (CATHETERS) ×2 IMPLANT
TROCAR 12M 150ML BLUNT (TROCAR) ×2 IMPLANT
TROCAR BLADELESS OPT 5 150 (ENDOMECHANICALS) ×1 IMPLANT
TROCAR BLADELESS OPT 5 75 (ENDOMECHANICALS) ×2 IMPLANT
TROCAR ENDOPATH XCEL 12X100 BL (ENDOMECHANICALS) ×2 IMPLANT
TROCAR XCEL 12X100 BLDLESS (ENDOMECHANICALS) ×2 IMPLANT
TUBING FILTER THERMOFLATOR (ELECTROSURGICAL) ×1 IMPLANT
WATER STERILE IRR 1500ML POUR (IV SOLUTION) ×4 IMPLANT

## 2011-10-20 NOTE — Op Note (Signed)
Pre-operative Diagnosis: abnormal uterine bleeding and fibroids  Post-operative Diagnosis: abnormal uterine bleeding and fibroids  Operation: Robotic-assisted myomectomy, lysis of adhesions  Surgeon: Roseanna Rainbow  Assistant: Coral Ceo, MD  Anesthesia: GET  Urine Output: per Anesthesiology  Findings: There were omental adhesions to the parietal peritoneum of the anterior abdominal wall.  There was a sessile, subserosal myoma arising from the lower, posterior uterine wall--approximately 1 cm in diameter.  There was an 8 cm, intramural mass involving the anterior wall/fundus.  The mass was soft, rubbery--fibroid-like in appearance.  There was no discreet pseudocapsule.  During the dissection, the endometrial cavity was entered.  Estimated Blood Loss:  200 mL                 Total IV Fluids: per Anesthesiology         Specimens: PATHOLOGY               Complications:  None; patient tolerated the procedure well.         Disposition: PACU - hemodynamically stable.         Condition: Stable    Procedure Details  The patient was seen in the Holding Room. The risks, benefits, complications, treatment options, and expected outcomes were discussed with the patient.  The patient concurred with the proposed plan, giving informed consent.  The site of surgery properly noted/marked. The patient was identified as Crystal Wallace and the procedure verified as a Robotic-assisted myomectomy.   A Time Out was held and the above information confirmed.  After induction of anesthesia, the patient was draped and prepped in the usual sterile manner. Pt was placed in supine position after anesthesia and draped and prepped in the usual sterile manner. The abdominal drape was placed after the CholoraPrep had been allowed to dry for 3 minutes.  Her arms were tucked to her side with all appropriate precautions.   The patient was placed in the semi-lithotomy position in [place] stirrups.  The  perineum was prepped with Betadine.  Foley catheter was placed.  A sterile speculum was placed in the vagina.  The cervix was grasped with a single-tooth tenaculum and dilated with Shawnie Pons dilators.  The ZUMI uterine manipulator was placed without difficulty.  A pneum occluder balloon was placed over the manipulator.  A second time-out was performed.  OG tube placement was confirmed and to suction.  Approximately 2 cm below the costal margin, in the midclavicular line the skin was anesthestized with 0.25% Marcaine.  A 5 mm incision was made and using a 5 mm Optiview, a 5 mm trocar was placed under direct vision.  The patient's abdomen was insufflated with CO2 gas.  At this point and all points during the procedure, the patient's intra-abdominal pressure did not exceed 15 mmHg.  A 10-12 camera port was place 24 cm above the pubic symphysis.  Bilateral 8 mm ports were place 10 cm and 15 degrees inferior.  An 8 mm port was placed in the right upper quadrant.  All ports were placed under direct visualization.  The 5 mm port was removed.  The incision was extended to accommodate a 10 mm trocar.  A 10 mm trocar and sleeve were advanced under direct visualization.   The omental adhesions were divided using endoshears.  The robot was docked in the usual fashion.  It was felt that the fibroid involved the posterior wall of the uterus on pre-operative radiologic imaging.  The posterior uterine serosa was infiltrated with a dilute  Pitressin solution in the midline.The posterior wall was incised vertically and no myoma was encountered.  This incision was repaired in layers using a 2-0 V lock and 3-0 vicryl to approximate the serosal layer.  The anterior uterine serosa was infiltrated with a dilute Pitressin solution in the midline.The anterior uterine wall was then incised in a transverse fashion.  The incision was extended down to the fibrioid-like mass. The mass was then dissected from the surrounding myometrium using sharp and  blunt dissection and placed in the posterior cul de sac.  The endometrium was approximated in a running fashion with 3-0 Monocryl.  The remainder of the ncision was repaired in layers using a 2-0 V lock and 3-0 vicryl to approximate the serosal layer. The base of the subserosal was transected with monopolar cautery.  The defect was over sewn with a figure-of-eight suture of 2- 0 vicryl.  Adequate hemostasis was noted.  A Seprafilm slurry was then drizzled over the incisions.  The mass placed in an endocatch bag.    The instruments were then removed under direct visualization and the robotic ports removed.  The robot was undocked.  The supraumbilical incision was extended.  The mass was morcellated in the bag, and removed from the abdomen. Deep, subcutaneous, figure-of-eight 0-Vicryl sutures on a UR-6 needle were placed in the 10-12 mm supraumbilical and infracostal incisions.  All skin incisions were closed in a subcuticular fashion using 3-0 Monocryl.  Dermabond was applied.  The vagina was swabbed with minimal bleeding noted.   All instrument and needle counts were correct x  2.

## 2011-10-20 NOTE — Transfer of Care (Signed)
Immediate Anesthesia Transfer of Care Note  Patient: Crystal Wallace  Procedure(s) Performed: Procedure(s) (LRB): ROBOTIC ASSISTED MYOMECTOMY (N/A)  Patient Location: PACU  Anesthesia Type: General  Level of Consciousness: awake, sedated, patient cooperative and responds to stimulation  Airway & Oxygen Therapy: Patient Spontanous Breathing and Patient connected to face mask oxygen  Post-op Assessment: Report given to PACU RN, Post -op Vital signs reviewed and stable and Patient moving all extremities  Post vital signs: Reviewed and stable  Complications: No apparent anesthesia complications

## 2011-10-20 NOTE — Preoperative (Signed)
Beta Blockers   Reason not to administer Beta Blockers:Not Applicable, not on home BB 

## 2011-10-20 NOTE — Anesthesia Postprocedure Evaluation (Signed)
Anesthesia Post Note  Patient: Crystal Wallace  Procedure(s) Performed: Procedure(s) (LRB): ROBOTIC ASSISTED MYOMECTOMY (N/A)  Anesthesia type: General  Patient location: PACU  Post pain: Pain level controlled  Post assessment: Post-op Vital signs reviewed  Last Vitals:  Filed Vitals:   10/20/11 1630  BP: 161/64  Pulse: 109  Temp: 37.9 C  Resp: 25    Post vital signs: Reviewed  Level of consciousness: sedated  Complications: No apparent anesthesia complications

## 2011-10-20 NOTE — Anesthesia Preprocedure Evaluation (Signed)
Anesthesia Evaluation  Patient identified by MRN, date of birth, ID band Patient awake    Reviewed: Allergy & Precautions, H&P , NPO status , Patient's Chart, lab work & pertinent test results  Airway Mallampati: III TM Distance: >3 FB Neck ROM: Full    Dental No notable dental hx.    Pulmonary neg pulmonary ROS,  breath sounds clear to auscultation  Pulmonary exam normal       Cardiovascular negative cardio ROS  Rhythm:Regular Rate:Normal     Neuro/Psych negative neurological ROS  negative psych ROS   GI/Hepatic negative GI ROS, Neg liver ROS,   Endo/Other  negative endocrine ROSMorbid obesity  Renal/GU negative Renal ROS  negative genitourinary   Musculoskeletal negative musculoskeletal ROS (+)   Abdominal   Peds negative pediatric ROS (+)  Hematology negative hematology ROS (+)   Anesthesia Other Findings   Reproductive/Obstetrics negative OB ROS                           Anesthesia Physical Anesthesia Plan  ASA: III  Anesthesia Plan: General   Post-op Pain Management:    Induction: Intravenous  Airway Management Planned: Oral ETT  Additional Equipment:   Intra-op Plan:   Post-operative Plan: Extubation in OR  Informed Consent: I have reviewed the patients History and Physical, chart, labs and discussed the procedure including the risks, benefits and alternatives for the proposed anesthesia with the patient or authorized representative who has indicated his/her understanding and acceptance.   Dental advisory given  Plan Discussed with: CRNA  Anesthesia Plan Comments:         Anesthesia Quick Evaluation

## 2011-10-20 NOTE — Interval H&P Note (Signed)
History and Physical Interval Note:  10/20/2011 10:18 AM  Crystal Wallace  has presented today for surgery, with the diagnosis of symptomatic uterine fibroids  The various methods of treatment have been discussed with the patient and family. After consideration of risks, benefits and other options for treatment, the patient has consented to  Procedure(s) (LRB): ROBOTIC ASSISTED MYOMECTOMY (N/A) as a surgical intervention .  The patients' history has been reviewed, patient examined, no change in status, stable for surgery.  I have reviewed the patients' chart and labs.  Questions were answered to the patient's satisfaction.     [name],[name]

## 2011-10-21 LAB — CBC
HCT: 28.2 % — ABNORMAL LOW (ref 36.0–46.0)
Platelets: 410 10*3/uL — ABNORMAL HIGH (ref 150–400)
RBC: 3.2 MIL/uL — ABNORMAL LOW (ref 3.87–5.11)
RDW: 13.4 % (ref 11.5–15.5)
WBC: 15.6 10*3/uL — ABNORMAL HIGH (ref 4.0–10.5)

## 2011-10-21 LAB — BASIC METABOLIC PANEL
BUN: 11 mg/dL (ref 6–23)
CO2: 22 mEq/L (ref 19–32)
Chloride: 102 mEq/L (ref 96–112)
GFR calc Af Amer: >90 mL/min (ref >90)
Potassium: 4 mEq/L (ref 3.5–5.1)

## 2011-10-21 MED ORDER — OXYCODONE-ACETAMINOPHEN 5-325 MG PO TABS: 1.0000 | ORAL_TABLET | Freq: Four times a day (QID) | ORAL | Status: AC | PRN

## 2011-10-21 NOTE — Plan of Care (Signed)
Problem: Phase I Progression Outcomes Goal: Other Phase I Outcomes/Goals Outcome: Completed/Met Date Met:  10/21/11 Foley removed

## 2011-10-21 NOTE — Discharge Instructions (Signed)
Myomectomy Care After  Refer to this sheet in the next few weeks. These instructions provide you with information on caring for yourself after your procedure. Your caregiver may also give you specific instructions. Your treatment has been planned according to current medical practices, but problems sometimes occur. Call your caregiver if you have any problems or questions after your procedure. HOME CARE INSTRUCTIONS   Only take over-the-counter or prescription medicines for pain, discomfort, or fever as directed by your caregiver. Avoid aspirin because it can cause bleeding.   Do not douche, use tampons, or have intercourse until given permission to by your caregiver.   Change your bandage (dressing) as directed.   Do not drive until you are given permission to by your caregiver.   Take showers instead of baths as directed by your caregiver.   If you become constipated, you may take a mild laxative with your caregiver's permission. Eat more bran and drink enough fluids to keep your urine clear or pale yellow.   Take your temperature twice a day and write it down.   Do not drink alcohol.   Do not drive while on pain medicine (narcotics).   Have help at home for 1 week, or until you can do your own household activities.   Keep all follow-up appointments with your caregiver.  SEEK MEDICAL CARE IF:  You develop a temperature of 100 F (37.8 C) or higher.   You have increasing stomach pain and medicine does not help.   You have nausea, vomiting, or diarrhea.   You have pain when you urinate, or you have blood in your urine.   You have a rash on your body.   You have pain or redness where your intravenous (IV) access tube was inserted.   You develop weakness or lightheadedness.   You need stronger pain medicine.   You have a reaction or side effects from your medicines.  SEEK IMMEDIATE MEDICAL CARE IF:   You have pain, swelling, or any kind of drainage from your incision.    You have pain, swelling, or redness in your leg.   You have chest pain.   You faint.   You have shortness of breath.   You have heavy vaginal bleeding.   You see pus coming from the incision.   Your incision is opening up.  MAKE SURE YOU:  Understand these instructions.   Will watch your condition.   Will get help right away if you are not doing well or get worse.  Document Released: 09/14/2010 Document Revised: 04/13/2011 Document Reviewed: 09/14/2010 [facility] Patient Information 2012 [place], [place].  Iron Deficiency Anemia There are many types of anemia. Iron deficiency anemia is the most common. Iron deficiency anemia is a decrease in the number of red blood cells caused by too little iron. Without enough iron, your body does not produce enough hemoglobin. Hemoglobin is a substance in red blood cells that carries oxygen to the body's tissues. Iron deficiency anemia may leave you tired and short of breath. CAUSES   Lack of iron in the diet.   This may be seen in infants and children, because there is little iron in milk.   This may be seen in adults who do not eat enough iron-rich foods.   This may be seen in pregnant or breastfeeding women who do not take iron supplements. There is a much higher need for iron intake at these times.   Poor absorption of iron, as seen with intestinal disorders.  Intestinal bleeding.   Heavy periods.  SYMPTOMS  Mild anemia may not be noticeable. Symptoms may include:  Fatigue.   Headache.   Pale skin.   Weakness.   Shortness of breath.   Dizziness.   Cold hands and feet.   Fast or irregular heartbeat.  DIAGNOSIS  Diagnosis requires a thorough evaluation and physical exam by your caregiver.  Blood tests are generally used to confirm iron deficiency anemia.   Additional tests may be done to find the underlying cause of your anemia. These may include:   Testing for blood in the stool (fecal occult blood test).   A  procedure to see inside the colon and rectum (colonoscopy).   A procedure to see inside the esophagus and stomach (endoscopy).  TREATMENT   Correcting the cause of the iron deficiency is the first step.   Medicines, such as oral contraceptives, can make heavy menstrual flows lighter.   Antibiotics and other medicines can be used to treat peptic ulcers.   Surgery may be needed to remove a bleeding polyp, tumor, or fibroid.   Often, iron supplements (ferrous sulfate) are taken.   For the best iron absorption, take these supplements with an empty stomach.   You may need to take the supplements with food if you cannot tolerate them on an empty stomach. Vitamin C improves the absorption of iron. Your caregiver may recommend taking your iron tablets with a glass of orange juice or vitamin C supplement.   Milk and antacids should not be taken at the same time as iron supplements. They may interfere with the absorption of iron.   Iron supplements can cause constipation. A stool softener is often recommended.   Pregnant and breastfeeding women will need to take extra iron, because their normal diet usually will not provide the required amount.   Patients who cannot tolerate iron by mouth can take it through a vein (intravenously) or by an injection into the muscle.  HOME CARE INSTRUCTIONS   Ask your dietitian for help with diet questions.   Take iron and vitamins as directed by your caregiver.   Eat a diet rich in iron. Eat liver, lean beef, whole-grain bread, eggs, dried fruit, and dark green leafy vegetables.  SEEK IMMEDIATE MEDICAL CARE IF:   You have a fainting episode. Do not drive yourself. Call your local emergency services (911 in [place] if no other help is available.   You have chest pain, nausea, or vomiting.   You develop severe or increased shortness of breath with activities.   You develop weakness or increased thirst.   You have a rapid heartbeat.   You develop  unexplained sweating or become lightheaded when getting up from a chair or bed.  MAKE SURE YOU:   Understand these instructions.   Will watch your condition.   Will get help right away if you are not doing well or get worse.  Document Released: 04/21/2000 Document Revised: 01/04/2011 Document Reviewed: 08/31/2009 [facility] Patient Information 2012 [place], [place].

## 2011-10-21 NOTE — Discharge Summary (Signed)
Physician Discharge Summary  Patient ID: Crystal Wallace MRN: [id number] DOB/AGE: [birth year] 36 y.o.  Admit date: 10/20/2011 Discharge date: 10/21/2011  Admission Diagnoses:  Active Problems:  Adnexal mass  Abnormal uterine bleeding  Leiomyoma of uterus, unspecified  Discharge Diagnoses:  Active Problems:  Leiomyoma of uterus, unspecified   Discharged Condition: stable  Hospital Course: On 10/20/2011, the patient underwent the following: Procedure(s): ROBOTIC ASSISTED MYOMECTOMY.   The postoperative course was uneventful.  She was discharged to home on postoperative day 1 tolerating a regular diet.  Consults: none  Significant Diagnostic Studies: none  Treatments: surgery: see above  Discharge Exam: Blood pressure 177/82, pulse 80, temperature 98 F (36.7 C), temperature source Oral, resp. rate 20, height 5' 3.5" (1.613 m), weight 122.471 kg (270 lb), SpO2 98.00%. General appearance: alert GI: soft, non-tender; bowel sounds normal; no masses,  no organomegaly Extremities: extremities normal, atraumatic, no cyanosis or edema Incision/Wound: C/D/I  Disposition: 01-Home or Self Care  Discharge Orders    Future Appointments: Provider: Department: Dept Phone: Center:   11/23/2011 9:05 AM Margaree Mackintosh, MD [facility] [phone] [facility]   11/24/2011 3:00 PM Margaree Mackintosh, MD [facility] [phone]     Future Orders Please Complete By Expires   Diet - low sodium heart healthy      Increase activity slowly      May walk up steps      May shower / Bathe      Comments:   No tub baths for 6 weeks   Driving Restrictions      Comments:   No driving for 1- 2 weeks   Lifting restrictions      Comments:   No lifting > 30 lbs for 6 weeks   Sexual Activity Restrictions      Comments:   No intercourse for 6 - 8 weeks   Discharge wound care:      Comments:   Keep clean and dry   Call MD for:  temperature >100.4      Call MD for:  persistant nausea and  vomiting      Call MD for:  severe uncontrolled pain      Call MD for:  redness, tenderness, or signs of infection (pain, swelling, redness, odor or green/yellow discharge around incision site)      Call MD for:  persistant dizziness or light-headedness      Call MD for:  extreme fatigue        Medication List  As of 10/21/2011 12:59 PM   STOP taking these medications         traMADol 50 MG tablet         TAKE these medications         diazepam 5 MG tablet   Commonly known as: VALIUM   Take 5 mg by mouth at bedtime as needed. For sleep        ibuprofen 200 MG tablet   Commonly known as: ADVIL,MOTRIN   Take 600 mg by mouth every 6 (six) hours as needed. For pain      oxyCODONE-acetaminophen 5-325 MG per tablet   Commonly known as: PERCOCET   Take 1-2 tablets by mouth every 6 (six) hours as needed (moderate to severe pain (when tolerating fluids)).           Follow-up Information    Follow up with Antionette Char A, MD. Schedule an appointment as soon as possible for a visit in 2 weeks.  Contact information:   [address], [address] [place] [address] [phone]          Signed: Roseanna Rainbow 10/21/2011, 12:59 PM

## 2011-10-23 NOTE — Anesthesia Postprocedure Evaluation (Signed)
  Anesthesia Post-op Note  Patient: Crystal Wallace  Procedure(s) Performed: Procedure(s) (LRB): ROBOTIC ASSISTED MYOMECTOMY (N/A)  Patient Location: PACU  Anesthesia Type: General  Level of Consciousness: awake and alert   Airway and Oxygen Therapy: Patient Spontanous Breathing  Post-op Pain: mild  Post-op Assessment: Post-op Vital signs reviewed, Patient's Cardiovascular Status Stable, Respiratory Function Stable, Patent Airway and No signs of Nausea or vomiting  Post-op Vital Signs: stable  Complications: No apparent anesthesia complications

## 2011-10-24 ENCOUNTER — Encounter (HOSPITAL_COMMUNITY): Payer: Self-pay | Admitting: Obstetrics & Gynecology

## 2011-11-23 ENCOUNTER — Other Ambulatory Visit: Payer: Self-pay | Admitting: Internal Medicine

## 2011-11-23 ENCOUNTER — Other Ambulatory Visit: Payer: 59 | Admitting: Internal Medicine

## 2011-11-23 DIAGNOSIS — Z0000 Encounter for general adult medical examination without abnormal findings: Secondary | ICD-10-CM

## 2011-11-23 LAB — COMPREHENSIVE METABOLIC PANEL
Albumin: 4.2 g/dL (ref 3.5–5.2)
BUN: 12 mg/dL (ref 6–23)
CO2: 23 mEq/L (ref 19–32)
Calcium: 9 mg/dL (ref 8.4–10.5)
Chloride: 108 mEq/L (ref 96–112)
Glucose, Bld: 102 mg/dL — ABNORMAL HIGH (ref 70–99)
Potassium: 3.5 mEq/L (ref 3.5–5.3)

## 2011-11-23 LAB — CBC WITH DIFFERENTIAL/PLATELET
HCT: 32.1 % — ABNORMAL LOW (ref 36.0–46.0)
Hemoglobin: 10.8 g/dL — ABNORMAL LOW (ref 12.0–15.0)
Lymphocytes Relative: 48 % — ABNORMAL HIGH (ref 12–46)
MCV: 82.3 fL (ref 78.0–100.0)
Monocytes Absolute: 0.6 10*3/uL (ref 0.1–1.0)
Monocytes Relative: 6 % (ref 3–12)
Neutro Abs: 3.9 10*3/uL (ref 1.7–7.7)
WBC: 8.9 10*3/uL (ref 4.0–10.5)

## 2011-11-23 LAB — TSH: TSH: 1.257 u[IU]/mL (ref 0.350–4.500)

## 2011-11-23 LAB — LIPID PANEL
Cholesterol: 207 mg/dL — ABNORMAL HIGH (ref 0–200)
HDL: 46 mg/dL (ref >39)
Triglycerides: 98 mg/dL (ref <150)

## 2011-11-24 ENCOUNTER — Ambulatory Visit (INDEPENDENT_AMBULATORY_CARE_PROVIDER_SITE_OTHER): Payer: 59 | Admitting: Internal Medicine

## 2011-11-24 VITALS — BP 178/94 | HR 72 | Temp 100.0°F | Ht 63.5 in | Wt 261.0 lb

## 2011-11-24 DIAGNOSIS — I10 Essential (primary) hypertension: Secondary | ICD-10-CM

## 2011-11-24 DIAGNOSIS — E669 Obesity, unspecified: Secondary | ICD-10-CM

## 2011-11-24 DIAGNOSIS — D509 Iron deficiency anemia, unspecified: Secondary | ICD-10-CM

## 2011-11-24 DIAGNOSIS — E785 Hyperlipidemia, unspecified: Secondary | ICD-10-CM

## 2011-11-24 DIAGNOSIS — Z0000 Encounter for general adult medical examination without abnormal findings: Secondary | ICD-10-CM

## 2011-11-24 DIAGNOSIS — Z23 Encounter for immunization: Secondary | ICD-10-CM

## 2011-11-24 LAB — POCT URINALYSIS DIPSTICK
Glucose, UA: NEGATIVE
Spec Grav, UA: >=1.03
Urobilinogen, UA: NEGATIVE

## 2011-11-24 LAB — RETICULOCYTES: Retic Ct Pct: 1.8 % (ref 0.4–2.3)

## 2011-11-25 LAB — IRON AND TIBC
%SAT: 9 % — ABNORMAL LOW (ref 20–55)
Iron: 32 ug/dL — ABNORMAL LOW (ref 42–145)
UIBC: 322 ug/dL (ref 125–400)

## 2011-11-25 LAB — HEMOGLOBIN A1C: Mean Plasma Glucose: 108 mg/dL (ref <117)

## 2011-12-04 ENCOUNTER — Encounter: Payer: Self-pay | Admitting: Internal Medicine

## 2011-12-04 DIAGNOSIS — E782 Mixed hyperlipidemia: Secondary | ICD-10-CM | POA: Insufficient documentation

## 2011-12-04 DIAGNOSIS — I10 Essential (primary) hypertension: Secondary | ICD-10-CM | POA: Insufficient documentation

## 2011-12-04 NOTE — Progress Notes (Signed)
  Subjective:    Patient ID: Crystal Wallace, female    DOB: Apr 24, 1976, 36 y.o.   MRN: [id number]  HPI 36 year old black female presents to the office for the first time today referred by Dr. Tamela Oddi. Patient has history of hypertension. Currently not on any medication. She is employed as a Glass blower/designer at [employer]. No known drug allergies. History of allergic rhinitis which is seasonal and related to pollen.  History of fractured right foot fifth metatarsal 1997. Sprain left ankle 2010. In June 2013 she underwent a robot-assisted myomectomy for uterine fibroids. Had a history of heavy uterine bleeding. Now has an IUD.  Social history is single has a high school college education and did attend college but does not have college degree. Does not smoke or consume alcohol. Has one daughter age 37. Has been employed by [employer] for 5 years. Very outgoing and personable.  Family history: Both parents with hypertension. Father with history of diabetes. Father with history of kidney stones. Mother is history of hyperlipidemia.  Of note: Father is on myocarditis 80 mg, amlodipine 10 mg, chlorthalidone 25 mg      Review of Systems  Constitutional: Positive for fatigue.       Obesity  All other systems reviewed and are negative.       Objective:   Physical Exam  Vitals reviewed. Constitutional: She is oriented to person, place, and time. She appears well-developed and well-nourished.  HENT:  Head: Normocephalic and atraumatic.  Right Ear: External ear normal.  Left Ear: External ear normal.  Mouth/Throat: Oropharynx is clear and moist.  Eyes: Right eye exhibits no discharge. Left eye exhibits no discharge.  Neck: Neck supple. No JVD present.  Cardiovascular: Normal rate, regular rhythm, normal heart sounds and intact distal pulses.   No murmur heard. Pulmonary/Chest: Effort normal and breath sounds normal. She has no wheezes. She has no rales.       Breasts normal female    Abdominal: Soft. Bowel sounds are normal. She exhibits no distension and no mass. There is no rebound and no guarding.  Genitourinary:       Deferred to GYN  Musculoskeletal: Normal range of motion. She exhibits no edema.  Neurological: She is alert and oriented to person, place, and time. No cranial nerve deficit. Coordination normal.  Skin: Skin is warm and dry. No rash noted.  Psychiatric: She has a normal mood and affect. Her behavior is normal. Judgment and thought content normal.          Assessment & Plan:  Essential hypertension  Iron deficiency anemia likely related to history of menorrhagia. Iron level is low at 32. Hemoglobin improved status hospital discharge.  Obesity  History of recent robotic assisted myomectomy for uterine fibroids  History of menorrhagia related uterine fibroids status post myomectomy  Hyperlipidemia  Plan: Since father is on amlodipine I have started her on amlodipine 5 mg daily with plans to recheck blood pressure with office visit in 3 weeks. She needs to try to take iron supplement if possible. If that is not tolerated, we may need to order IV iron. Recommend diet and exercise and recheck lipid panel in 6 months. Hemoglobin A1c is normal.

## 2011-12-04 NOTE — Patient Instructions (Addendum)
Try taking iron supplement twice daily. Return in 3 weeks for followup on hypertension. Take amlodipine daily for hypertension.

## 2011-12-15 ENCOUNTER — Ambulatory Visit: Payer: 59 | Admitting: Internal Medicine

## 2011-12-19 ENCOUNTER — Encounter: Payer: Self-pay | Admitting: Internal Medicine

## 2011-12-19 ENCOUNTER — Ambulatory Visit (INDEPENDENT_AMBULATORY_CARE_PROVIDER_SITE_OTHER): Payer: 59 | Admitting: Internal Medicine

## 2011-12-19 VITALS — BP 144/68 | HR 84 | Temp 98.6°F | Wt 258.5 lb

## 2011-12-19 DIAGNOSIS — I10 Essential (primary) hypertension: Secondary | ICD-10-CM

## 2011-12-24 ENCOUNTER — Encounter: Payer: Self-pay | Admitting: Internal Medicine

## 2011-12-24 NOTE — Progress Notes (Signed)
  Subjective:    Patient ID: Crystal Wallace, female    DOB: 1976-04-01, 36 y.o.   MRN: [id number]  HPI 36 year old black female presented 3 weeks ago for the first time referred by Dr. Tamela Oddi. Had family history of hypertension. Started on Norvasc 5 mg daily at last visit. Blood pressure has improved. No side effects with Norvasc. However blood pressure is not at goal at this point.    Review of Systems     Objective:   Physical Exam neck supple without JVD thyromegaly or carotid bruits; chest clear to auscultation; cardiac exam regular rate and rhythm normal S1 and S2; extremities without edema        Assessment & Plan:  Essential hypertension  Plan: Add HCTZ 25 mg daily to Norvasc 5 mg daily and return in 4-6 weeks. At that time serum potassium will need to be checked.

## 2011-12-24 NOTE — Patient Instructions (Addendum)
Begin HCTZ 25 mg daily and continue amlodipine 5 mg daily. Return in 4 weeks. Serum potassium will be checked at that time.

## 2012-01-18 ENCOUNTER — Ambulatory Visit: Payer: 59 | Admitting: Internal Medicine

## 2012-01-22 ENCOUNTER — Ambulatory Visit (INDEPENDENT_AMBULATORY_CARE_PROVIDER_SITE_OTHER): Payer: 59 | Admitting: Internal Medicine

## 2012-01-22 ENCOUNTER — Encounter: Payer: Self-pay | Admitting: Internal Medicine

## 2012-01-22 VITALS — BP 136/74 | HR 80 | Temp 99.4°F | Wt 264.0 lb

## 2012-01-22 DIAGNOSIS — D509 Iron deficiency anemia, unspecified: Secondary | ICD-10-CM

## 2012-01-22 DIAGNOSIS — I10 Essential (primary) hypertension: Secondary | ICD-10-CM

## 2012-01-23 LAB — VARICELLA ZOSTER ANTIBODY, IGG: Varicella IgG: 4.81 {ISR} — ABNORMAL HIGH (ref <0.90)

## 2012-01-23 LAB — POTASSIUM: Potassium: 3.4 mEq/L — ABNORMAL LOW (ref 3.5–5.3)

## 2012-01-24 NOTE — Progress Notes (Signed)
Letter with these lab results sent to patient along with Rx for K-dur

## 2012-02-05 NOTE — Patient Instructions (Addendum)
Add Micarditis 80 mg daily to HCTZ 25 mg daily and amlodipine 5 mg daily. Return in 3 weeks. Serum potassium checked today.

## 2012-02-05 NOTE — Progress Notes (Signed)
  Subjective:    Patient ID: Crystal Wallace, female    DOB: 14-Dec-1975, 36 y.o.   MRN: [id number]  HPI 36 year old black female with history of hypertension. At her initial evaluation in July, we learned that her father took amlodipine and thought it might work for her. She was placed on amlodipine 5 mg daily. When she returned last month, we added HCTZ 25 mg daily to amlodipine because father also takes diuretic. She's here today for blood pressure recheck. Blood pressure is still not at goal. Serum potassium checked today because she is on HCTZ. She seems a bit frustrated that blood pressure is not yet under control. Explained her sometimes this takes some time to get at goal. Her father also takes Micardis 80 mg daily.   Review of Systems     Objective:   Physical Exam neck supple without JVD thyromegaly or carotid bruits; chest clear to auscultation; cardiac exam regular rate and rhythm normal S1-S2; extremities trace lower extremity edema.        Assessment & Plan:  Hypertension  Iron deficiency anemia which developed status post robotic myomectomy now on iron therapy  Plan: Serum potassium checked. Continue amlodipine 5 mg daily. If we increase amlodipine to 10 mg daily, she is likely to have dependent edema. I think it's important to continue HCTZ. Have given her samples of Micardis 80 mg daily and we'll see her again in 3 weeks

## 2012-02-19 ENCOUNTER — Encounter: Payer: Self-pay | Admitting: Internal Medicine

## 2012-02-19 ENCOUNTER — Ambulatory Visit (INDEPENDENT_AMBULATORY_CARE_PROVIDER_SITE_OTHER): Payer: 59 | Admitting: Internal Medicine

## 2012-02-19 VITALS — BP 122/56 | HR 88 | Ht 63.5 in | Wt 265.0 lb

## 2012-02-19 DIAGNOSIS — E876 Hypokalemia: Secondary | ICD-10-CM

## 2012-02-19 DIAGNOSIS — D649 Anemia, unspecified: Secondary | ICD-10-CM

## 2012-02-19 DIAGNOSIS — I10 Essential (primary) hypertension: Secondary | ICD-10-CM

## 2012-02-19 DIAGNOSIS — D72829 Elevated white blood cell count, unspecified: Secondary | ICD-10-CM

## 2012-02-19 DIAGNOSIS — Z862 Personal history of diseases of the blood and blood-forming organs and certain disorders involving the immune mechanism: Secondary | ICD-10-CM

## 2012-02-19 NOTE — Progress Notes (Signed)
  Subjective:    Patient ID: Crystal Wallace, female    DOB: August 06, 1975, 36 y.o.   MRN: [id number]  HPI  36 year old Black female with hypertension in today for recheck. Taking potassium supplement with diuretic. Has had mild hypokalemia. Declines influenza immunization. History of iron deficiency anemia from uterine fibroids. Has been on iron supplement for about 3 months. Will recheck CBC today. I am pleased with her blood pressure control.    Review of Systems     Objective:   Physical Exam Chest clear to auscultation. Cardiac:  RRR normal S1 and S2 ; Ext without edema        Assessment & Plan:  Hypertension-under good control  Hypokalemia secondary to diuretic  History of iron deficiency anemia secondary to menorrhagia from uterine fibroids status post myomectomy  Plan: CBC checked today along with serum potassium. Return in 4 months.

## 2012-02-20 LAB — BASIC METABOLIC PANEL
BUN: 13 mg/dL (ref 6–23)
Calcium: 9.9 mg/dL (ref 8.4–10.5)
Potassium: 3.6 mEq/L (ref 3.5–5.3)
Sodium: 138 mEq/L (ref 135–145)

## 2012-02-20 LAB — CBC WITH DIFFERENTIAL/PLATELET
Basophils Absolute: 0.1 10*3/uL (ref 0.0–0.1)
Basophils Relative: 0 % (ref 0–1)
Eosinophils Absolute: 0.1 10*3/uL (ref 0.0–0.7)
MCH: 27.9 pg (ref 26.0–34.0)
MCHC: 33.2 g/dL (ref 30.0–36.0)
Monocytes Absolute: 1.1 10*3/uL — ABNORMAL HIGH (ref 0.1–1.0)
Neutro Abs: 8.4 10*3/uL — ABNORMAL HIGH (ref 1.7–7.7)
Neutrophils Relative %: 50 % (ref 43–77)
RDW: 15.6 % — ABNORMAL HIGH (ref 11.5–15.5)

## 2012-02-20 LAB — PATHOLOGIST SMEAR REVIEW

## 2012-02-20 LAB — IRON AND TIBC: %SAT: 16 % — ABNORMAL LOW (ref 20–55)

## 2012-03-02 ENCOUNTER — Encounter: Payer: Self-pay | Admitting: Internal Medicine

## 2012-03-02 NOTE — Patient Instructions (Addendum)
We will check CBC today for followup on iron deficiency anemia. Iron level will be checked. Continue same medications and return in 4 months.

## 2012-06-13 ENCOUNTER — Ambulatory Visit: Payer: 59 | Admitting: Internal Medicine

## 2012-09-02 ENCOUNTER — Other Ambulatory Visit: Payer: Self-pay

## 2012-09-19 ENCOUNTER — Encounter: Payer: Self-pay | Admitting: Internal Medicine

## 2012-09-19 ENCOUNTER — Ambulatory Visit (INDEPENDENT_AMBULATORY_CARE_PROVIDER_SITE_OTHER): Payer: 59 | Admitting: Internal Medicine

## 2012-09-19 ENCOUNTER — Other Ambulatory Visit: Payer: Self-pay

## 2012-09-19 VITALS — BP 134/84 | HR 84 | Temp 98.0°F | Wt 269.0 lb

## 2012-09-19 DIAGNOSIS — L02233 Carbuncle of chest wall: Secondary | ICD-10-CM

## 2012-09-19 DIAGNOSIS — E669 Obesity, unspecified: Secondary | ICD-10-CM

## 2012-09-19 DIAGNOSIS — I10 Essential (primary) hypertension: Secondary | ICD-10-CM

## 2012-09-19 MED ORDER — HYDROCHLOROTHIAZIDE 25 MG PO TABS: 25.0000 mg | ORAL_TABLET | Freq: Every day | ORAL | Status: DC

## 2012-09-19 NOTE — Patient Instructions (Addendum)
Appointment made for you to be seen by general surgeon tomorrow afternoon at [facility]. Start doxycycline 100 mg twice daily for 10 days. Take hydrocodone/APAP 10/325 as needed for pain. Given 2 weeks of  Micardis samples.  Insurance coming does not cover this at the present time so changed to Cozaar 100 mg daily, continue HCTZ and amlodipine once samples have been consumed.  Return in 5 weeks.

## 2012-09-19 NOTE — Progress Notes (Signed)
  Subjective:    Patient ID: Crystal Wallace, female    DOB: May 20, 1975, 37 y.o.   MRN: [id number]  HPI  37 year old Black female in today for recheck on hypertension. Today she has a new problem. She has a large indurated area adjacent to right medial breast that has been present for about a week. Says it has drained a bit a few days ago. Says 2-1/2 years ago, she had a similar lesion same area that she thinks  opened up  by itself without medical intervention, and  eventually resolved without antibiotic therapy. She does not think there is a chronic cystic lesion there between episodes. Having a lot of pain today with this lesion.  With regard to hypertension, Micardis is not covered any longer on her insurance plan. She is out of it and I have given her some samples. We will likely need to change to Cozaar instead which is available in generic form.      Review of Systems     Objective:   Physical Exam Blood pressure stable but she is off Micardis. Says she has been taking amlodipine and HCTZ.   Approximate 2.5 cm x 1.5 cm red  indurated lesion adjacent to right medial breast which is extremely tender.  Neck is supple without JVD thyromegaly or carotid bruits. Chest clear to auscultation. Cardiac exam regular rate and rhythm normal S1 and S2. Extremities without pitting edema        Assessment & Plan:  Hypertension requiring 3 drug regimen. Patient currently off Micardis. Samples provided for 2 weeks. Insurance does not cover Micardis. Therefore will need to switch to Cozaar generic 100 mg daily. For now continue HCTZ and amlodipine. See again in 5 weeks or sooner if necessary. At that time will need office visit, blood pressure check and basic metabolic panel. If she tolerates Cozaar, we can likely change her to Hyzaar 100/25 which will include her diuretic at next visit.  Carbuncle anterior chest-started on Doxycycline 100 mg twice daily for 10 days. Hydrocodone/APAP 10/325 ( #60)  one  by mouth every 6 hours with food when necessary pain. Appointment to see General Surgeon tomorrow for incision and drainage of carbuncle.  History  of iron deficiency anemia. CBC done Fall 2013 showed anemia to be resolved.

## 2012-09-20 ENCOUNTER — Encounter (INDEPENDENT_AMBULATORY_CARE_PROVIDER_SITE_OTHER): Payer: Self-pay | Admitting: Surgery

## 2012-09-20 ENCOUNTER — Ambulatory Visit (INDEPENDENT_AMBULATORY_CARE_PROVIDER_SITE_OTHER): Payer: 59 | Admitting: Surgery

## 2012-09-20 VITALS — BP 132/78 | HR 80 | Temp 98.0°F | Resp 18 | Ht 63.0 in | Wt 279.0 lb

## 2012-09-20 DIAGNOSIS — L0390 Cellulitis, unspecified: Secondary | ICD-10-CM

## 2012-09-20 DIAGNOSIS — L03319 Cellulitis of trunk, unspecified: Secondary | ICD-10-CM

## 2012-09-20 DIAGNOSIS — L02219 Cutaneous abscess of trunk, unspecified: Secondary | ICD-10-CM

## 2012-09-20 NOTE — Progress Notes (Signed)
General Surgery [facility]  Chief Complaint  Patient presents with  . New Evaluation    abscess of chest wall - referral from Dr. Eden Emms Baxley    HISTORY: Patient is a 37 year old black female referred by her primary care physician with recurrent abscess and cellulitis involving the anterior chest wall at the medial aspect of the right breast. Patient had a similar episode 2 years ago. Over the past week and a half the patient is noted pain and swelling. She was seen by her primary care physician yesterday and started on oral antibiotics. Patient experienced some drainage early this morning. She now presents for evaluation and surgical management.  Past Medical History  Diagnosis Date  . Uterine fibroid      Current Outpatient Prescriptions  Medication Sig Dispense Refill  . amLODipine (NORVASC) 5 MG tablet Take 5 mg by mouth daily.      . diazepam (VALIUM) 5 MG tablet Take 5 mg by mouth at bedtime as needed. For sleep       . ferrous sulfate 325 (65 FE) MG tablet Take 325 mg by mouth daily with breakfast.      . hydrochlorothiazide (HYDRODIURIL) 25 MG tablet Take 1 tablet (25 mg total) by mouth daily.  90 tablet  1  . ibuprofen (ADVIL,MOTRIN) 200 MG tablet Take 600 mg by mouth every 6 (six) hours as needed. For pain      . levonorgestrel (MIRENA) 20 MCG/24HR IUD 1 each by Intrauterine route once.      . Vitamin D, Ergocalciferol, (DRISDOL) 50000 UNITS CAPS Take 50,000 Units by mouth.      . potassium chloride SA (K-DUR,KLOR-CON) 20 MEQ tablet       . PREMARIN 1.25 MG tablet        No current facility-administered medications for this visit.     No Known Allergies   Family History  Problem Relation Age of Onset  . Arthritis Mother   . Diabetes Sister   . Hypertension Sister      History   Social History  . Marital Status: Single    Spouse Name: N/A    Number of Children: N/A  . Years of Education: N/A   Social History Main Topics  .  Smoking status: Never Smoker   . Smokeless tobacco: Never Used  . Alcohol Use: No  . Drug Use: No  . Sexually Active: None   Other Topics Concern  . None   Social History Narrative  . None     REVIEW OF SYSTEMS - PERTINENT POSITIVES ONLY: Previous episode in same site 2 years ago. Denies being diabetic. Denies allergies.  EXAM: Filed Vitals:   09/20/12 1432  BP: 132/78  Pulse: 80  Temp: 98 F (36.7 C)  Resp: 18    HEENT: normocephalic; pupils equal and reactive; sclerae clear; dentition good; mucous membranes moist NECK:  symmetric on extension; no palpable anterior or posterior cervical lymphadenopathy; no supraclavicular masses; no tenderness CHEST: clear to auscultation bilaterally without rales, rhonchi, or wheezes CARDIAC: regular rate and rhythm without significant murmur; peripheral pulses are full CHEST: Area of fluctuance at the medial aspect of the right breast overlying the sternum measuring approximately 4 cm in diameter, moderate induration, tenderness, a 1 mm opening with a slight amount of serous drainage; moderate fluctuance. EXT:  non-tender without edema; no deformity NEURO: no gross focal deficits; no sign of tremor  PROCEDURE: Under aseptic conditions, the skin is anesthetized with local anesthetic. Using  a #15 blade a 1 cm incision is made in the roof of the abscess. Purulent fluid is evacuated. Abscess cavity is packed with quarter inch iodoform gauze packing. Dry gauze dressings are placed. Procedure was tolerated well.  LABORATORY RESULTS: See [facility] ([facility]) for most recent results   RADIOLOGY RESULTS: See [facility] ([facility]pic) for most recent results   IMPRESSION: Abscess and cellulitis, chest wall, at the medial aspect of right breast  PLAN: Usual instructions for local wound care given. Patient will remove the packing in 48 hours. She will complete her course of oral antibiotics. Patient will return for evaluation. If there  is a significant residual cyst, she will require excision as an outpatient surgical procedure under sedation and local anesthesia.  Patient will return in 6 weeks for assessment.  Velora Heckler, MD, FACS General & Endocrine Surgery [facility]   Visit Diagnoses: 1. Cellulitis and abscess, chest wall     Primary Care Physician: Margaree Mackintosh, MD

## 2012-09-20 NOTE — Patient Instructions (Signed)
Change gauze dressing as needed.  Leave packing in place until Monday, 5/19.  Remove packing on Monday.  Shower daily.  Cover wound with dry gauze.  Complete all of oral antibiotics as prescribed.  Velora Heckler, MD, [facility] Office: [phone]

## 2012-10-30 ENCOUNTER — Telehealth (INDEPENDENT_AMBULATORY_CARE_PROVIDER_SITE_OTHER): Payer: Self-pay

## 2012-10-30 ENCOUNTER — Encounter (INDEPENDENT_AMBULATORY_CARE_PROVIDER_SITE_OTHER): Payer: 59 | Admitting: Surgery

## 2012-10-30 NOTE — Telephone Encounter (Signed)
LMOM re: missed appt. Call to r/s. Pt n/s 10-30-12.

## 2012-11-01 ENCOUNTER — Ambulatory Visit (INDEPENDENT_AMBULATORY_CARE_PROVIDER_SITE_OTHER): Payer: 59 | Admitting: Internal Medicine

## 2012-11-01 ENCOUNTER — Encounter: Payer: Self-pay | Admitting: Internal Medicine

## 2012-11-01 VITALS — BP 154/84 | HR 88 | Temp 98.9°F | Wt 284.0 lb

## 2012-11-01 DIAGNOSIS — I10 Essential (primary) hypertension: Secondary | ICD-10-CM

## 2012-11-01 LAB — BASIC METABOLIC PANEL
CO2: 28 mEq/L (ref 19–32)
Chloride: 102 mEq/L (ref 96–112)
Creat: 0.67 mg/dL (ref 0.50–1.10)
Potassium: 3.6 mEq/L (ref 3.5–5.3)
Sodium: 140 mEq/L (ref 135–145)

## 2012-11-01 MED ORDER — AMLODIPINE BESYLATE 5 MG PO TABS: 5.0000 mg | ORAL_TABLET | Freq: Every day | ORAL | Status: DC

## 2012-11-01 MED ORDER — HYDROCHLOROTHIAZIDE 25 MG PO TABS: 25.0000 mg | ORAL_TABLET | Freq: Every day | ORAL | Status: DC

## 2012-11-04 NOTE — Patient Instructions (Addendum)
Please check with pharmacy about your medications and prices

## 2012-11-04 NOTE — Progress Notes (Signed)
  Subjective:    Patient ID: Crystal Wallace, female    DOB: March 25, 1976, 37 y.o.   MRN: [id number]  HPI  37 year old white female with significant essential hypertension previously took Micardis, amlodipine, HCTZ. Blood pressure became well controlled in the fall of 2013. However she says that Micardis is no longer covered by insurance plan. We did call pharmacy and found out it generic was available. I think she may need to check around with other drugstores to see what the prices are. At last visit we changed her from Micardis to Cozaar. Says Cozaar is running about $40 a month which seems a bit high to me. We called her pharmacy and they requested that she come in with her insurance card and they can work with her to get her better prices.     Review of Systems     Objective:   Physical Exam neck is supple without JVD thyromegaly or carotid bruits. Chest clear to auscultation. Cardiac exam regular rate and rhythm normal S1 and S2.         Assessment & Plan:  Hypertension  Obesity  Plan: Advised patient to return for free blood pressure check once we get these medications squared away. Be met drawn today on losartan.

## 2012-11-05 ENCOUNTER — Ambulatory Visit (INDEPENDENT_AMBULATORY_CARE_PROVIDER_SITE_OTHER): Payer: 59 | Admitting: Internal Medicine

## 2012-11-05 VITALS — BP 160/80

## 2012-11-05 DIAGNOSIS — I10 Essential (primary) hypertension: Secondary | ICD-10-CM

## 2012-11-14 ENCOUNTER — Ambulatory Visit (INDEPENDENT_AMBULATORY_CARE_PROVIDER_SITE_OTHER): Payer: 59 | Admitting: Internal Medicine

## 2012-11-14 VITALS — BP 134/86

## 2012-11-14 DIAGNOSIS — I10 Essential (primary) hypertension: Secondary | ICD-10-CM

## 2012-11-14 DIAGNOSIS — E669 Obesity, unspecified: Secondary | ICD-10-CM

## 2012-11-16 ENCOUNTER — Encounter: Payer: Self-pay | Admitting: Internal Medicine

## 2012-11-16 NOTE — Progress Notes (Signed)
  Subjective:    Patient ID: Crystal Wallace, female    DOB: 04-02-1976, 37 y.o.   MRN: [id number]  HPI For BP recheck. Insurance no longer covers Micardis and it is very expensive for her. We called her pharmmacy and although it is available generic, it will still be extremely expensive for her. Losartan is just not working as well for her.   Review of Systems     Objective:   Physical Exam BP above          Assessment & Plan:  HTN Obesity  Plan: We will obtain samples of Micardis for you as soon as possible.

## 2012-11-16 NOTE — Patient Instructions (Addendum)
Micardis works better for you than Losartan. We will find samples of Micardis for you.

## 2012-11-16 NOTE — Patient Instructions (Addendum)
Continue Micarditis 80 mg daily and diuretic for blood pressure control.  Return in 4-6 months

## 2012-11-16 NOTE — Progress Notes (Signed)
  Subjective:    Patient ID: Crystal Wallace, female    DOB: 04-10-1976, 37 y.o.   MRN: [id number]  HPI 37 year old black female with history of hypertension. Has better control with myocarditis then with losartan. We have found her samples of myocarditis which she picked up recently and has been taking over the past few days. Blood pressure has improved being back on my card as. There is some issue with her insurance not covering myocarditis even as a generic form at. It's very expensive for her. We have provided her with a number of samples courtesy of pharmaceutical company.    Review of Systems     Objective:   Physical Exam Blood pressure is above       Assessment & Plan:  Continue myocarditis and diuretic for control of hypertension  Return in 4 -6 months

## 2013-03-17 ENCOUNTER — Telehealth: Payer: Self-pay | Admitting: Internal Medicine

## 2013-03-17 NOTE — Telephone Encounter (Signed)
We have Micardiis sample for pt. But may not be able to get more from drug rep. She can come pick these up but needs to check with Human Resources about cost of generic Micardis which was previously quite expensive. We tried other meds that did not work as well.

## 2013-03-26 ENCOUNTER — Other Ambulatory Visit: Payer: Self-pay | Admitting: *Deleted

## 2013-03-26 DIAGNOSIS — Z130 Encounter for screening for diseases of the blood and blood-forming organs and certain disorders involving the immune mechanism: Secondary | ICD-10-CM

## 2013-03-26 DIAGNOSIS — E669 Obesity, unspecified: Secondary | ICD-10-CM

## 2013-03-26 DIAGNOSIS — D509 Iron deficiency anemia, unspecified: Secondary | ICD-10-CM

## 2013-03-26 DIAGNOSIS — I10 Essential (primary) hypertension: Secondary | ICD-10-CM

## 2013-03-26 DIAGNOSIS — E785 Hyperlipidemia, unspecified: Secondary | ICD-10-CM

## 2013-03-26 DIAGNOSIS — Z1329 Encounter for screening for other suspected endocrine disorder: Secondary | ICD-10-CM

## 2013-03-31 ENCOUNTER — Other Ambulatory Visit: Payer: 59 | Admitting: Internal Medicine

## 2013-03-31 ENCOUNTER — Other Ambulatory Visit: Payer: Self-pay | Admitting: Internal Medicine

## 2013-03-31 DIAGNOSIS — Z1329 Encounter for screening for other suspected endocrine disorder: Secondary | ICD-10-CM

## 2013-03-31 DIAGNOSIS — E785 Hyperlipidemia, unspecified: Secondary | ICD-10-CM

## 2013-03-31 DIAGNOSIS — I10 Essential (primary) hypertension: Secondary | ICD-10-CM

## 2013-03-31 DIAGNOSIS — E669 Obesity, unspecified: Secondary | ICD-10-CM

## 2013-03-31 LAB — CBC WITH DIFFERENTIAL/PLATELET
Basophils Absolute: 0 10*3/uL (ref 0.0–0.1)
Basophils Relative: 0 % (ref 0–1)
Eosinophils Absolute: 0.2 10*3/uL (ref 0.0–0.7)
Eosinophils Relative: 2 % (ref 0–5)
Lymphocytes Relative: 41 % (ref 12–46)
MCHC: 33.9 g/dL (ref 30.0–36.0)
MCV: 87.8 fL (ref 78.0–100.0)
Monocytes Absolute: 0.7 10*3/uL (ref 0.1–1.0)
Platelets: 368 10*3/uL (ref 150–400)
RBC: 4.34 MIL/uL (ref 3.87–5.11)
RDW: 13.1 % (ref 11.5–15.5)
WBC: 14.2 10*3/uL — ABNORMAL HIGH (ref 4.0–10.5)

## 2013-03-31 LAB — COMPREHENSIVE METABOLIC PANEL
ALT: 19 U/L (ref 0–35)
AST: 19 U/L (ref 0–37)
Albumin: 4.4 g/dL (ref 3.5–5.2)
Alkaline Phosphatase: 66 U/L (ref 39–117)
BUN: 10 mg/dL (ref 6–23)
Calcium: 9.8 mg/dL (ref 8.4–10.5)
Chloride: 100 mEq/L (ref 96–112)
Creat: 0.69 mg/dL (ref 0.50–1.10)
Total Bilirubin: 0.6 mg/dL (ref 0.3–1.2)

## 2013-03-31 LAB — LIPID PANEL
HDL: 51 mg/dL (ref >39)
Total CHOL/HDL Ratio: 4.2 Ratio
Triglycerides: 194 mg/dL — ABNORMAL HIGH (ref <150)
VLDL: 39 mg/dL (ref 0–40)

## 2013-04-01 ENCOUNTER — Ambulatory Visit (INDEPENDENT_AMBULATORY_CARE_PROVIDER_SITE_OTHER): Payer: 59 | Admitting: Internal Medicine

## 2013-04-01 ENCOUNTER — Encounter: Payer: Self-pay | Admitting: Internal Medicine

## 2013-04-01 VITALS — BP 150/84 | HR 84 | Temp 98.5°F | Ht 63.5 in | Wt 280.0 lb

## 2013-04-01 DIAGNOSIS — E119 Type 2 diabetes mellitus without complications: Secondary | ICD-10-CM

## 2013-04-01 DIAGNOSIS — E669 Obesity, unspecified: Secondary | ICD-10-CM

## 2013-04-01 DIAGNOSIS — I10 Essential (primary) hypertension: Secondary | ICD-10-CM

## 2013-04-01 DIAGNOSIS — Z0000 Encounter for general adult medical examination without abnormal findings: Secondary | ICD-10-CM

## 2013-04-01 LAB — HEMOGLOBIN A1C
Hgb A1c MFr Bld: 7.3 % — ABNORMAL HIGH (ref <5.7)
Mean Plasma Glucose: 163 mg/dL — ABNORMAL HIGH (ref <117)

## 2013-04-01 LAB — POCT URINALYSIS DIPSTICK
Bilirubin, UA: NEGATIVE
Glucose, UA: NEGATIVE
Ketones, UA: NEGATIVE
Leukocytes, UA: NEGATIVE
Nitrite, UA: NEGATIVE
Urobilinogen, UA: 0.2

## 2013-04-11 ENCOUNTER — Ambulatory Visit (INDEPENDENT_AMBULATORY_CARE_PROVIDER_SITE_OTHER): Payer: 59 | Admitting: Internal Medicine

## 2013-04-11 ENCOUNTER — Encounter: Payer: Self-pay | Admitting: Internal Medicine

## 2013-04-11 VITALS — BP 122/80 | HR 80 | Temp 98.2°F | Ht 63.5 in | Wt 279.0 lb

## 2013-04-11 DIAGNOSIS — I10 Essential (primary) hypertension: Secondary | ICD-10-CM

## 2013-04-11 DIAGNOSIS — E119 Type 2 diabetes mellitus without complications: Secondary | ICD-10-CM

## 2013-04-11 DIAGNOSIS — E669 Obesity, unspecified: Secondary | ICD-10-CM

## 2013-05-29 ENCOUNTER — Ambulatory Visit: Payer: 59 | Admitting: Internal Medicine

## 2013-06-16 ENCOUNTER — Other Ambulatory Visit: Payer: Self-pay | Admitting: Internal Medicine

## 2013-06-17 ENCOUNTER — Encounter: Payer: Self-pay | Admitting: Internal Medicine

## 2013-06-17 ENCOUNTER — Ambulatory Visit (INDEPENDENT_AMBULATORY_CARE_PROVIDER_SITE_OTHER): Payer: No Typology Code available for payment source | Admitting: Internal Medicine

## 2013-06-17 VITALS — BP 130/82 | HR 76 | Wt 274.0 lb

## 2013-06-17 DIAGNOSIS — E119 Type 2 diabetes mellitus without complications: Secondary | ICD-10-CM

## 2013-06-17 DIAGNOSIS — I10 Essential (primary) hypertension: Secondary | ICD-10-CM

## 2013-06-17 NOTE — Patient Instructions (Signed)
Await hemoglobin A1c results. Continue same medications and diet exercise. Return July 2015 for six-month recheck

## 2013-06-17 NOTE — Progress Notes (Signed)
   Subjective:    Patient ID: Crystal Wallace, female    DOB: [birth year], 38 y.o.   MRN: [id number]  HPI  In  November 2014, her hemoglobin A1c was 7.3%. We put her on a diabetic diet. Ordered a home glucose monitoring diabetic test strips. Unfortunately she could not afford disease. She now has new insurance. Needs new prescription for diabetic test strips lancets and home glucose monitor because she lost the prescription I Wallace her. Have recently refilled her amlodipine at her request. She remains on Micardis and HCTZ.. Says she's been following a diabetic diet. Has had sinusitis about a month or so ago. Seemed to clear on its own with over-the-counter medication.    Review of Systems     Objective:   Physical Exam TMs are slightly dull bilaterally. They are not red. Pharynx is clear. Neck is supple. Chest clear. Cardiac exam regular rate and rhythm normal S1 and S2       Assessment & Plan:  Type 2 diabetes mellitus  Obesity-continue diet and exercise  Hypertension-stable  Recent sinusitis   Plan: I do not think she needs antibiotics at this point in time. Seems to have cleared respiratory infection. Hemoglobin A1c drawn and pending. New prescription for home glucose monitor, diabetic test strips and lancets as to use at least once a day but preferably before lunch and before supper. Continue diabetic diet for now until we have results of hemoglobin A1c. Return in July for followup with hemoglobin A1c and blood pressure check. Physical exam due November 2015. Has lost 6 pounds since November visit.

## 2013-06-18 LAB — HEMOGLOBIN A1C
Hgb A1c MFr Bld: 6.4 % — ABNORMAL HIGH (ref <5.7)
Mean Plasma Glucose: 137 mg/dL — ABNORMAL HIGH (ref <117)

## 2013-07-04 ENCOUNTER — Other Ambulatory Visit: Payer: Self-pay

## 2013-07-04 MED ORDER — AMLODIPINE BESYLATE 5 MG PO TABS: 5.0000 mg | ORAL_TABLET | Freq: Every day | ORAL | Status: DC

## 2013-07-04 MED ORDER — WALGREENS LANCETS MICRO THIN MISC: Status: DC

## 2013-07-04 MED ORDER — GLUCOSE BLOOD VI STRP: ORAL_STRIP | Status: DC

## 2013-07-06 NOTE — Progress Notes (Addendum)
   Subjective:    Patient ID: Crystal Wallace, female    DOB: Mar 22, 1976, 38 y.o.   MRN: [id number]  HPI 38 year old Black Female with history of obesity and hypertension in today for health maintenance exam. History of iron deficiency anemia related to uterine fibroids and menorrhagia. June 2013 she underwent a robotic assisted myomectomy. She now has IUD. Last Pap smear done at [facility]  in April 2013 was normal.  No known drug allergies.  Past Medical History: History of fractured right fifth metatarsal 1997. Sprain left ankle 2010. History of allergic rhinitis  Social history: He is single, has a high school education. She did attend college but does not have college degree. Does not smoke or consume alcohol. Has one daughter, a teenager. He is employed by [employer] as a Radiation protection practitioner.  Family history: Both parents with hypertension. Father with history of diabetes and kidney stones. Mother with history of hyperlipidemia.  Of note, Father is on micardis 80 mg daily, amlodipine 10 mg daily, chlorthalidone 25 mg daily.  We chose Micardis for this patient because it worked well with her father. However we've had trouble getting that prescription for her at a reasonable price.       Review of Systems  Constitutional: Negative.   All other systems reviewed and are negative.      Objective:   Physical Exam  Vitals reviewed. Constitutional: She is oriented to person, place, and time. She appears well-developed and well-nourished. No distress.  HENT:  Head: Normocephalic and atraumatic.  Right Ear: External ear normal.  Left Ear: External ear normal.  Mouth/Throat: Oropharynx is clear and moist. No oropharyngeal exudate.  Eyes: Conjunctivae and EOM are normal. Pupils are equal, round, and reactive to light. Right eye exhibits no discharge. Left eye exhibits no discharge. No scleral icterus.  Neck: Neck supple. No JVD present. No thyromegaly present.  Cardiovascular: Normal rate,  regular rhythm and normal heart sounds.   No murmur heard. Pulmonary/Chest: Effort normal and breath sounds normal. No respiratory distress. She has no wheezes. She has no rales. She exhibits no tenderness.  Breasts pendulous normal female without masses  Abdominal: Soft. Bowel sounds are normal. She exhibits no distension. There is no tenderness. There is no rebound and no guarding.  Genitourinary:  Last Pap done [facility] 2013. Bimanual normal.  Musculoskeletal: Normal range of motion. She exhibits no edema.  Lymphadenopathy:    She has no cervical adenopathy.  Neurological: She is alert and oriented to person, place, and time. She has normal reflexes. Coordination normal.  Skin: Skin is warm and dry. No rash noted. She is not diaphoretic.  Psychiatric: She has a normal mood and affect. Her behavior is normal. Judgment and thought content normal.          Assessment & Plan:  Hypertension-blood pressure elevated today at 150/84. Medications include amlodipine,  Micardis, HCTZ. Reevaluate December 5.  Type 2 diabetes mellitus-hemoglobin A1c 7.3%. Needs a trial of diet and exercise.  Obesity  Plan: Spent considerable time talking with patient regarding diet exercise and weight loss. Talked with her about diabetic diet. Written prescription for an Accu-Chek machines and test strips. Return in early December.

## 2013-07-06 NOTE — Patient Instructions (Addendum)
Continue same antihypertensive medication. Purchase Accu-Chek and check Accu-Chek readings before breakfast and before supper. Return in early December.

## 2013-07-16 ENCOUNTER — Other Ambulatory Visit: Payer: Self-pay

## 2013-07-16 MED ORDER — GLUCOSE BLOOD VI STRP: ORAL_STRIP | Status: DC

## 2013-07-16 MED ORDER — FREESTYLE SYSTEM KIT: 1.0000 | PACK | Status: DC | PRN

## 2013-09-03 ENCOUNTER — Encounter: Payer: Self-pay | Admitting: Internal Medicine

## 2013-09-03 NOTE — Progress Notes (Signed)
   Subjective:    Patient ID: Crystal Wallace, female    DOB: [birth year], 38 y.o.   MRN: [id number]  HPI At last visit, blood pressure was elevated. She is here today to followup. Has been found to have diabetes mellitus with recent hemoglobin A1c 7.3%. Once again spoke with her about diet exercise and weight loss. Went over low-fat low-calorie diet choices with her. Blood pressure is much better today than on previous visit.    Review of Systems     Objective:   Physical Exam Neck supple without JVD thyromegaly or carotid bruits. Diabetic foot exam without ulcers or calluses. Chest clear to auscultation. Cardiac exam regular rate and rhythm normal S1 and S2. Extremities without edema.       Assessment & Plan:  Hypertension-well-controlled on current 3 drug regimen. Patient needs to regularly take medication and try the diet exercise  Obesity  New onset diabetes mellitus with hemoglobin A1c 7.3% spent considerable time talking with her about diabetic diet. Needs to purchase home glucose monitor.  Recent URI -symptoms have resolved  Plan: Purchase Accu-Chek machine and check glucoses at least twice daily. Followup here in 8 weeks.

## 2013-09-03 NOTE — Patient Instructions (Addendum)
Monitor Accu-Cheks before breakfast and before supper. Continue same antihypertensive medication. Watch diet and try to exercise.

## 2013-10-23 ENCOUNTER — Other Ambulatory Visit: Payer: Self-pay

## 2013-10-23 MED ORDER — HYDROCHLOROTHIAZIDE 25 MG PO TABS: 25.0000 mg | ORAL_TABLET | Freq: Every day | ORAL | Status: DC

## 2013-10-23 MED ORDER — AMLODIPINE BESYLATE 5 MG PO TABS: 5.0000 mg | ORAL_TABLET | Freq: Every day | ORAL | Status: DC

## 2013-11-20 ENCOUNTER — Ambulatory Visit: Payer: No Typology Code available for payment source | Admitting: Internal Medicine

## 2013-11-25 ENCOUNTER — Encounter: Payer: Self-pay | Admitting: Internal Medicine

## 2013-11-25 ENCOUNTER — Ambulatory Visit (INDEPENDENT_AMBULATORY_CARE_PROVIDER_SITE_OTHER): Payer: No Typology Code available for payment source | Admitting: Internal Medicine

## 2013-11-25 VITALS — BP 140/76 | HR 68 | Temp 99.0°F | Wt 275.0 lb

## 2013-11-25 DIAGNOSIS — I10 Essential (primary) hypertension: Secondary | ICD-10-CM

## 2013-11-25 DIAGNOSIS — E119 Type 2 diabetes mellitus without complications: Secondary | ICD-10-CM

## 2013-11-25 DIAGNOSIS — Z23 Encounter for immunization: Secondary | ICD-10-CM

## 2013-11-25 MED ORDER — PNEUMOCOCCAL 13-VAL CONJ VACC IM SUSP: 0.5000 mL | INTRAMUSCULAR | Status: DC

## 2013-11-26 LAB — MICROALBUMIN, URINE: MICROALB UR: 0.7 mg/dL (ref 0.00–1.89)

## 2013-11-26 LAB — HEMOGLOBIN A1C
Hgb A1c MFr Bld: 6.5 % — ABNORMAL HIGH (ref <5.7)
MEAN PLASMA GLUCOSE: 140 mg/dL — AB (ref <117)

## 2013-11-27 ENCOUNTER — Other Ambulatory Visit: Payer: Self-pay

## 2013-11-27 MED ORDER — METFORMIN HCL 500 MG PO TABS
500.0000 mg | ORAL_TABLET | Freq: Two times a day (BID) | ORAL | Status: DC
Start: 2013-11-27 — End: 2013-12-19

## 2013-11-27 NOTE — Progress Notes (Signed)
Patient informed. Rx sent to local pharmacy

## 2013-11-27 NOTE — Progress Notes (Signed)
Left message for patient to call.

## 2013-12-01 ENCOUNTER — Telehealth: Payer: Self-pay | Admitting: Internal Medicine

## 2013-12-01 MED ORDER — TELMISARTAN 80 MG PO TABS: 80.0000 mg | ORAL_TABLET | Freq: Every day | ORAL | Status: DC

## 2013-12-01 NOTE — Telephone Encounter (Signed)
Have ordered by Cardizem 80 mg daily #30 with 5 refills to [facility]

## 2013-12-07 NOTE — Patient Instructions (Signed)
Pneumovax immunization given. Patient to check with pharmacy to see if Micardis will be covered by her new insurance plan. Patient is to purchase Accu-Chek machine and monitor Accu-Cheks. Return in November.

## 2013-12-07 NOTE — Progress Notes (Signed)
   Subjective:    Patient ID: [name] Gave, female    DOB: 09-13-1975, 38 y.o.   MRN: [id number]  HPI 38 year old 38 female in today for followup of hypertension, type 2 diabetes mellitus, obesity. Says that live for the past 6 months has been a bit hard. Has had a couple of deaths in the family. Brother's son passed away. Still grieving that loss. Is not paying attention to diet and exercise at this point in time.    Review of Systems     Objective:   Physical Exam Neck is supple without JVD thyromegaly or carotid bruits. Chest clear to auscultation. Cardiac exam regular rate and rhythm normal S1 and S2. Extremities without edema. Diabetic foot exam is normal       Assessment & Plan:

## 2013-12-19 ENCOUNTER — Encounter: Payer: Self-pay | Admitting: Internal Medicine

## 2013-12-19 ENCOUNTER — Ambulatory Visit (INDEPENDENT_AMBULATORY_CARE_PROVIDER_SITE_OTHER): Payer: No Typology Code available for payment source | Admitting: Internal Medicine

## 2013-12-19 VITALS — BP 132/70 | Temp 99.1°F | Wt 276.0 lb

## 2013-12-19 DIAGNOSIS — I10 Essential (primary) hypertension: Secondary | ICD-10-CM

## 2013-12-19 DIAGNOSIS — E119 Type 2 diabetes mellitus without complications: Secondary | ICD-10-CM

## 2013-12-19 DIAGNOSIS — H6693 Otitis media, unspecified, bilateral: Secondary | ICD-10-CM

## 2013-12-19 DIAGNOSIS — H6690 Otitis media, unspecified, unspecified ear: Secondary | ICD-10-CM

## 2013-12-19 DIAGNOSIS — J029 Acute pharyngitis, unspecified: Secondary | ICD-10-CM

## 2013-12-19 LAB — POCT RAPID STREP A (OFFICE): RAPID STREP A SCREEN: NEGATIVE

## 2013-12-19 MED ORDER — METFORMIN HCL 500 MG PO TABS: 500.0000 mg | ORAL_TABLET | Freq: Two times a day (BID) | ORAL | Status: DC

## 2013-12-19 MED ORDER — BENZONATATE 200 MG PO CAPS: 200.0000 mg | ORAL_CAPSULE | Freq: Three times a day (TID) | ORAL | Status: DC | PRN

## 2013-12-19 MED ORDER — TELMISARTAN 80 MG PO TABS: 80.0000 mg | ORAL_TABLET | Freq: Every day | ORAL | Status: DC

## 2013-12-19 MED ORDER — AMOXICILLIN 500 MG PO CAPS: 500.0000 mg | ORAL_CAPSULE | Freq: Three times a day (TID) | ORAL | Status: DC

## 2013-12-19 NOTE — Patient Instructions (Addendum)
Take amoxicillin 500 mg 3 times daily for 10 days. Tessalon Perles 3 times daily as needed for cough. Call if not better in 7-10 days or sooner if worse

## 2013-12-22 ENCOUNTER — Encounter: Payer: Self-pay | Admitting: Internal Medicine

## 2014-02-01 NOTE — Progress Notes (Signed)
   Subjective:    Patient ID: Crystal Wallace, female    DOB: [birth year], 38 y.o.   MRN: [id number]  HPI  In today with acute respiratory infection symptoms. Has cough and congestion. Has malaise and fatigue. No documented fever. No shaking chills. History of hypertension and diabetes as well as obesity.    Review of Systems     Objective:   Physical Exam Pharynx is injected. Rapid strep screen negative. TMs are red bilaterally Neck is supple. Chest clear       Assessment & Plan:  Acute URI  Bilateral otitis media  Obesity  Type 2 diabetes mellitus  Hypertension  Plan: Amoxicillin 500 mg 3 times daily for 10 days. Tessalon Perles 200 mg 3 times daily if needed for cough. Call if not better in 7 days or sooner if worse

## 2014-03-24 ENCOUNTER — Other Ambulatory Visit: Payer: No Typology Code available for payment source | Admitting: Internal Medicine

## 2014-03-26 ENCOUNTER — Ambulatory Visit: Payer: No Typology Code available for payment source | Admitting: Internal Medicine

## 2014-03-29 ENCOUNTER — Other Ambulatory Visit: Payer: Self-pay | Admitting: Internal Medicine

## 2014-04-07 ENCOUNTER — Other Ambulatory Visit: Payer: No Typology Code available for payment source | Admitting: Internal Medicine

## 2014-04-07 DIAGNOSIS — I10 Essential (primary) hypertension: Secondary | ICD-10-CM

## 2014-04-07 DIAGNOSIS — E785 Hyperlipidemia, unspecified: Secondary | ICD-10-CM

## 2014-04-07 DIAGNOSIS — E119 Type 2 diabetes mellitus without complications: Secondary | ICD-10-CM

## 2014-04-07 LAB — HEMOGLOBIN A1C
Hgb A1c MFr Bld: 6 % — ABNORMAL HIGH (ref <5.7)
MEAN PLASMA GLUCOSE: 126 mg/dL — AB (ref <117)

## 2014-04-07 LAB — LIPID PANEL
CHOL/HDL RATIO: 4.8 ratio
Cholesterol: 231 mg/dL — ABNORMAL HIGH (ref 0–200)
HDL: 48 mg/dL (ref >39)
LDL Cholesterol: 159 mg/dL — ABNORMAL HIGH (ref 0–99)
Triglycerides: 118 mg/dL (ref <150)
VLDL: 24 mg/dL (ref 0–40)

## 2014-04-09 ENCOUNTER — Ambulatory Visit (INDEPENDENT_AMBULATORY_CARE_PROVIDER_SITE_OTHER): Payer: No Typology Code available for payment source | Admitting: Internal Medicine

## 2014-04-09 ENCOUNTER — Encounter: Payer: Self-pay | Admitting: Internal Medicine

## 2014-04-09 VITALS — BP 132/74 | HR 68 | Temp 99.2°F | Ht 63.5 in | Wt 268.0 lb

## 2014-04-09 DIAGNOSIS — E119 Type 2 diabetes mellitus without complications: Secondary | ICD-10-CM

## 2014-04-09 DIAGNOSIS — E785 Hyperlipidemia, unspecified: Secondary | ICD-10-CM

## 2014-04-09 DIAGNOSIS — I10 Essential (primary) hypertension: Secondary | ICD-10-CM

## 2014-04-09 MED ORDER — SIMVASTATIN 10 MG PO TABS: 10.0000 mg | ORAL_TABLET | Freq: Every day | ORAL | Status: DC

## 2014-04-09 NOTE — Patient Instructions (Addendum)
Take Zocor 10 mg daily. Continue diet and exercise. Return in 4 months for Hgb AIC , BP check , lipid , and liver panel.

## 2014-04-09 NOTE — Progress Notes (Signed)
   Subjective:    Patient ID: Crystal Wallace, female    DOB: [birth year], 38 y.o.   MRN: [id number]  HPI  Has lost  8 pounds since July.  Hemoglobin A1c has improved to 6% from 6.5%. However total cholesterol is gone up from 213-231. Triglycerides are down from 194-118. LDL cholesterol has gone up from 123-159.    Review of Systems     Objective:   Physical Exam  Blood pressure is stable on current regimen. Skin warm and dry. Nodes none. Neck is supple without JVD thyromegaly or carotid bruits. Extremities without edema. Cardiac exam regular rate and rhythm. Chest clear to auscultation.      Assessment & Plan:  Type 2 diabetes mellitus-stable  Hypertension-stable  Hyperlipidemia  Plan: Start Zocor 10 mg daily and return in 4 months for reevaluation. Encourage Diet exercise and weight loss.

## 2014-06-02 ENCOUNTER — Other Ambulatory Visit: Payer: Self-pay | Admitting: Internal Medicine

## 2014-06-02 MED ORDER — METFORMIN HCL 500 MG PO TABS: 500.0000 mg | ORAL_TABLET | Freq: Two times a day (BID) | ORAL | Status: DC

## 2014-06-02 MED ORDER — HYDROCHLOROTHIAZIDE 25 MG PO TABS: 25.0000 mg | ORAL_TABLET | Freq: Every day | ORAL | Status: DC

## 2014-06-02 NOTE — Telephone Encounter (Signed)
Patient no longer has insurance that has mail order pharmacy.  Would like you to please call her prescriptions in to the pharmacy listed below. Needs HCTZ and Metformin.    [facility]acy:  [facility] [address]

## 2014-06-02 NOTE — Telephone Encounter (Signed)
Refill HCTZ and Metformin for 6 months to [facility]

## 2014-06-02 NOTE — Telephone Encounter (Signed)
Please discuss her outstanding balance and payment arrangements. We will call in Rxs as she requests

## 2014-06-03 ENCOUNTER — Telehealth: Payer: Self-pay | Admitting: Internal Medicine

## 2014-06-03 ENCOUNTER — Other Ambulatory Visit: Payer: Self-pay | Admitting: *Deleted

## 2014-06-03 NOTE — Telephone Encounter (Signed)
Spoke with patient regarding her outstanding balance on her account with Dr. Renold Genta.  As of today, her self pay balance is $507.00.  Advised patient that there is additional balance of $400+ pended with insurance pending the processing of claims.  Advised patient that we know the balance will increase due to these pended claims.  Agreed with patient to set up payment plan based on the $507.00 balance as of today.  Patient agreed to $40/month beginning today.  We will re-evaluate plan in 6 months based on balance at that time.  Patient will call back tomorrow to make her first payment (she gets paid tomorrow).  Advised patient we do not charge interest on our payment plans.

## 2014-07-02 ENCOUNTER — Other Ambulatory Visit: Payer: Self-pay | Admitting: Internal Medicine

## 2014-07-02 ENCOUNTER — Telehealth: Payer: Self-pay | Admitting: Internal Medicine

## 2014-07-02 MED ORDER — SIMVASTATIN 10 MG PO TABS: 10.0000 mg | ORAL_TABLET | Freq: Every day | ORAL | Status: DC

## 2014-07-02 NOTE — Telephone Encounter (Signed)
Patient calls today to make her monthly payment on her budget plan of $40.  Visa payment made over the phone.  Auth #[id number].  Patient advised to place the receipt in her file and she will pick it up when she comes in to the office some time.  Placing copy of the receipt in the accordion file.

## 2014-07-02 NOTE — Telephone Encounter (Addendum)
Have refilled x 90 days

## 2014-07-02 NOTE — Telephone Encounter (Signed)
Needs refill on her Zocor 10mg .    Pharmacy:  [facility] on [address].

## 2014-07-05 ENCOUNTER — Encounter: Payer: Self-pay | Admitting: Internal Medicine

## 2014-07-16 ENCOUNTER — Other Ambulatory Visit: Payer: Self-pay | Admitting: *Deleted

## 2014-07-16 MED ORDER — TELMISARTAN 80 MG PO TABS: 80.0000 mg | ORAL_TABLET | Freq: Every day | ORAL | Status: DC

## 2014-07-16 NOTE — Telephone Encounter (Signed)
Micardis refills called in by Lucretia Roers to patient pharmacy

## 2014-08-13 ENCOUNTER — Other Ambulatory Visit: Payer: 59 | Admitting: Internal Medicine

## 2014-08-13 DIAGNOSIS — E119 Type 2 diabetes mellitus without complications: Secondary | ICD-10-CM

## 2014-08-13 DIAGNOSIS — E785 Hyperlipidemia, unspecified: Secondary | ICD-10-CM

## 2014-08-13 DIAGNOSIS — Z79899 Other long term (current) drug therapy: Secondary | ICD-10-CM

## 2014-08-13 LAB — HEMOGLOBIN A1C
Hgb A1c MFr Bld: 6.3 % — ABNORMAL HIGH (ref <5.7)
MEAN PLASMA GLUCOSE: 134 mg/dL — AB (ref <117)

## 2014-08-13 LAB — LIPID PANEL
Cholesterol: 149 mg/dL (ref 0–200)
HDL: 41 mg/dL — ABNORMAL LOW (ref >=46)
LDL Cholesterol: 83 mg/dL (ref 0–99)
TRIGLYCERIDES: 126 mg/dL (ref <150)
Total CHOL/HDL Ratio: 3.6 Ratio
VLDL: 25 mg/dL (ref 0–40)

## 2014-08-13 LAB — HEPATIC FUNCTION PANEL
ALBUMIN: 4.1 g/dL (ref 3.5–5.2)
ALT: 17 U/L (ref 0–35)
AST: 13 U/L (ref 0–37)
Alkaline Phosphatase: 52 U/L (ref 39–117)
BILIRUBIN TOTAL: 0.7 mg/dL (ref 0.2–1.2)
Bilirubin, Direct: 0.1 mg/dL (ref 0.0–0.3)
Indirect Bilirubin: 0.6 mg/dL (ref 0.2–1.2)
Total Protein: 6.7 g/dL (ref 6.0–8.3)

## 2014-08-14 ENCOUNTER — Encounter: Payer: Self-pay | Admitting: Internal Medicine

## 2014-08-14 ENCOUNTER — Ambulatory Visit (INDEPENDENT_AMBULATORY_CARE_PROVIDER_SITE_OTHER): Payer: 59 | Admitting: Internal Medicine

## 2014-08-14 VITALS — BP 126/72 | HR 78 | Temp 98.3°F | Wt 269.0 lb

## 2014-08-14 DIAGNOSIS — E8881 Metabolic syndrome: Secondary | ICD-10-CM | POA: Diagnosis not present

## 2014-08-14 DIAGNOSIS — E119 Type 2 diabetes mellitus without complications: Secondary | ICD-10-CM

## 2014-08-14 DIAGNOSIS — I10 Essential (primary) hypertension: Secondary | ICD-10-CM | POA: Diagnosis not present

## 2014-08-14 DIAGNOSIS — E785 Hyperlipidemia, unspecified: Secondary | ICD-10-CM

## 2014-08-28 ENCOUNTER — Telehealth: Payer: Self-pay | Admitting: Internal Medicine

## 2014-08-28 ENCOUNTER — Other Ambulatory Visit: Payer: Self-pay | Admitting: *Deleted

## 2014-08-28 MED ORDER — AMLODIPINE BESYLATE 5 MG PO TABS: 5.0000 mg | ORAL_TABLET | Freq: Every day | ORAL | Status: DC

## 2014-08-28 NOTE — Telephone Encounter (Signed)
Refill x 6 months 

## 2014-08-28 NOTE — Telephone Encounter (Signed)
Needs refill on her Rx:  Amlodipine 5 mg daily.  ** CHANGE Pharmacy:  [facility] @ [address] [facility] (used to be mail order and she's needing it now to be local please)

## 2014-10-05 ENCOUNTER — Encounter: Payer: Self-pay | Admitting: Internal Medicine

## 2014-10-05 NOTE — Patient Instructions (Signed)
Continue diet exercise and weight loss efforts. Continue metformin. Continue statin medication and anti-hypertensive medication. Return in August for office visit blood pressure check and hemoglobin A1c

## 2014-10-05 NOTE — Progress Notes (Signed)
   Subjective:    Patient ID: Crystal Wallace, female    DOB: 09-04-1975, 39 y.o.   MRN: [id number]  HPI In today to follow-up on hypertension, hyperlipidemia, obesity, metabolic syndrome, type 2 diabetes mellitus controlled without complication. This is her 4 month recheck appointment. She's actually gained a pound since her last visit 4 months ago.  Fasting lipid panel shows total cholesterol of 149 and previously was 231. LDL cholesterol is 83 and previously was 159. However hemoglobin A1c has increased from 6.0% to 6.3%.  Reminded about diabetic eye exam.  Review of Systems     Objective:   Physical Exam  Neck supple without JVD thyromegaly or carotid bruits. Chest clear to auscultation. Cardiac exam regular rate and rhythm. Extremities without edema.      Assessment & Plan:  Controlled type 2 diabetes but A1c has increased from 6.0% 6.3%  Morbid obesity-once again encouraged diet exercise and weight loss  Essential hypertension-stable on 3 drug regimen  Hyperlipidemia-now under good control on statin medication  Plan: Return in 4 minutes for office visit and hemoglobin A1c

## 2014-10-15 ENCOUNTER — Other Ambulatory Visit: Payer: Self-pay | Admitting: Internal Medicine

## 2014-10-27 ENCOUNTER — Telehealth: Payer: Self-pay | Admitting: Internal Medicine

## 2014-10-27 NOTE — Telephone Encounter (Signed)
Patient WALKED in today to pay on her account balance.  She paid $247.00 cash on her balance.  Provided receipt to patient.    Patient also wanted to make an appointment to be seen due to a rash on her LEFT forearm.  Rash was noted or little red, raised bumps.  She said they itch, they're bright red and they are ONLY in this area of her body.  They haven't spread anywhere else.  She has tried hydrocortisone cream on it and it didn't help.  Dr. Renold Genta walked through and she looked at her arm.  She prescribed Triamcinolone Cream 0.1% 60 gram.  Use on rash tid.  Script was provided to patient to take with her.  Copy of script was filed.  Patient will call for return to office if the rash doesn't get better in a couple of days.  No other complaints were mentioned by the patient.  She was otherwise doing well.  She has an appointment to be seen in follow up for BP and her diabetes on 12/15/14.

## 2014-12-14 ENCOUNTER — Other Ambulatory Visit: Payer: 59 | Admitting: Internal Medicine

## 2014-12-14 DIAGNOSIS — E119 Type 2 diabetes mellitus without complications: Secondary | ICD-10-CM

## 2014-12-14 LAB — HEMOGLOBIN A1C
Hgb A1c MFr Bld: 6.2 % — ABNORMAL HIGH (ref <5.7)
Mean Plasma Glucose: 131 mg/dL — ABNORMAL HIGH (ref <117)

## 2014-12-15 ENCOUNTER — Encounter: Payer: Self-pay | Admitting: Internal Medicine

## 2014-12-15 ENCOUNTER — Ambulatory Visit (INDEPENDENT_AMBULATORY_CARE_PROVIDER_SITE_OTHER): Payer: 59 | Admitting: Internal Medicine

## 2014-12-15 VITALS — BP 130/78 | HR 74 | Temp 97.9°F | Wt 266.5 lb

## 2014-12-15 DIAGNOSIS — R748 Abnormal levels of other serum enzymes: Secondary | ICD-10-CM

## 2014-12-15 DIAGNOSIS — E119 Type 2 diabetes mellitus without complications: Secondary | ICD-10-CM | POA: Diagnosis not present

## 2014-12-15 DIAGNOSIS — I10 Essential (primary) hypertension: Secondary | ICD-10-CM

## 2014-12-15 MED ORDER — METFORMIN HCL ER 750 MG PO TB24: 750.0000 mg | ORAL_TABLET | Freq: Every day | ORAL | Status: DC

## 2014-12-15 NOTE — Patient Instructions (Addendum)
Change Metformin to 750 mg ER daily. RTC December for physical exam

## 2014-12-16 LAB — MICROALBUMIN / CREATININE URINE RATIO
CREATININE, URINE: 163.2 mg/dL
Microalb Creat Ratio: 5.5 mg/g (ref 0.0–30.0)
Microalb, Ur: 0.9 mg/dL (ref <2.0)

## 2015-01-10 ENCOUNTER — Other Ambulatory Visit: Payer: Self-pay | Admitting: Internal Medicine

## 2015-01-27 ENCOUNTER — Other Ambulatory Visit: Payer: Self-pay | Admitting: Internal Medicine

## 2015-01-31 ENCOUNTER — Encounter: Payer: Self-pay | Admitting: Internal Medicine

## 2015-01-31 NOTE — Progress Notes (Signed)
   Subjective:    Patient ID: Crystal Wallace, female    DOB: 1976/04/01, 39 y.o.   MRN: [id number]  HPI In today for four-month follow-up on hypertension and diabetes. Blood pressure stable at 130/78. Weight is 266 1/2 pounds. We have talked repeatedly about the importance of diet and exercise with regard to her health. I told think she gets a lot of exercise outside her employment. Obesity runs in her family.  Twice daily metformin is causing some GI side effects. This is disturbing for her.  Hemoglobin A1c 6.2% and previously was 6.3% 4 months ago. I am pleased with her blood pressure control and her diabetic control. Would like to see her lose some more weight. Has only lost a half pound since last visit 4 months ago    Review of Systems as above     Objective:   Physical Exam  Constitutional: She appears well-developed and well-nourished. No distress.  Neck: No JVD present.  Cardiovascular: Normal rate, regular rhythm and normal heart sounds.   No murmur heard. Pulmonary/Chest: Effort normal and breath sounds normal. No respiratory distress. She has no wheezes. She has no rales. She exhibits no tenderness.  Musculoskeletal: She exhibits no edema.  Skin: Skin is warm and dry. She is not diaphoretic.  Vitals reviewed.         Assessment & Plan:  Controlled type 2 diabetes mellitus  Essential hypertension  Morbid obesity  Low HDL cholesterol. At Last measurement, it was 41  GI distress with metformin twice daily. Change metformin to 750 mg ER daily.   Return in December for physical examination and fasting labs. Continue diet and exercise efforts.

## 2015-03-01 ENCOUNTER — Encounter: Payer: Self-pay | Admitting: Internal Medicine

## 2015-03-01 ENCOUNTER — Ambulatory Visit (INDEPENDENT_AMBULATORY_CARE_PROVIDER_SITE_OTHER): Payer: 59 | Admitting: Internal Medicine

## 2015-03-01 VITALS — BP 126/72 | HR 83 | Temp 98.5°F | Resp 18 | Ht 64.0 in | Wt 258.0 lb

## 2015-03-01 DIAGNOSIS — J069 Acute upper respiratory infection, unspecified: Secondary | ICD-10-CM | POA: Diagnosis not present

## 2015-03-01 DIAGNOSIS — E119 Type 2 diabetes mellitus without complications: Secondary | ICD-10-CM | POA: Diagnosis not present

## 2015-03-01 DIAGNOSIS — H6693 Otitis media, unspecified, bilateral: Secondary | ICD-10-CM

## 2015-03-01 MED ORDER — AMOXICILLIN 500 MG PO CAPS: 500.0000 mg | ORAL_CAPSULE | Freq: Three times a day (TID) | ORAL | Status: DC

## 2015-03-01 NOTE — Progress Notes (Signed)
   Subjective:    Patient ID: Crystal Wallace, female    DOB: 16-Jun-1975, 39 y.o.   MRN: [id number]  HPI  Onset yesterday of URI symptoms with runny nose malaise and fatigue. No fever or shaking chills. Has been working a great deal over the past several weeks and not getting a lot of rest. Denies sore throat. Ears hurt. No significant cough. History of control tight 2 diabetes without insulin.    Review of Systems     Objective:   Physical Exam Right TM is red and dull. Left TM red and dull Pharynx not injected. Neck is supple without adenopathy. Chest: clear to auscultation without rales or wheezing.       Assessment & Plan:  Acute bilateral otitis media  Acute URI  Controlled type 2 diabetes non-insulin-dependent without complication  Plan: Out of work tomorrow. Amoxicillin 500 mg 3 times daily for 10 days. Rest and drink plenty of fluids.

## 2015-03-01 NOTE — Patient Instructions (Addendum)
Take amoxicillin 500 mg 3 times daily for 10 days. Out of work tomorrow. Rest and drink plenty of fluids.

## 2015-03-02 ENCOUNTER — Telehealth: Payer: Self-pay | Admitting: Internal Medicine

## 2015-03-02 MED ORDER — BENZONATATE 200 MG PO CAPS: 200.0000 mg | ORAL_CAPSULE | Freq: Three times a day (TID) | ORAL | Status: DC | PRN

## 2015-03-02 NOTE — Telephone Encounter (Signed)
Tessalon perles 200 mg 3 times daily prn cough   #60

## 2015-03-02 NOTE — Telephone Encounter (Signed)
You talked about something for cough yesterday and she apparently thought she may not need it.  She has decided this morning that she needs something for the cough.  Can we call her something in for the cough?    Pharmacy:  [facility] @ [place]

## 2015-03-02 NOTE — Telephone Encounter (Signed)
E-scribed tessalon pearles to pharmacy

## 2015-03-11 ENCOUNTER — Other Ambulatory Visit: Payer: Self-pay | Admitting: Internal Medicine

## 2015-04-16 ENCOUNTER — Other Ambulatory Visit: Payer: 59 | Admitting: Internal Medicine

## 2015-04-16 ENCOUNTER — Other Ambulatory Visit: Payer: Self-pay | Admitting: Internal Medicine

## 2015-04-16 DIAGNOSIS — Z0000 Encounter for general adult medical examination without abnormal findings: Secondary | ICD-10-CM

## 2015-04-16 DIAGNOSIS — E669 Obesity, unspecified: Secondary | ICD-10-CM

## 2015-04-16 DIAGNOSIS — Z1329 Encounter for screening for other suspected endocrine disorder: Secondary | ICD-10-CM

## 2015-04-16 DIAGNOSIS — D509 Iron deficiency anemia, unspecified: Secondary | ICD-10-CM

## 2015-04-16 DIAGNOSIS — Z1321 Encounter for screening for nutritional disorder: Secondary | ICD-10-CM

## 2015-04-16 DIAGNOSIS — Z79899 Other long term (current) drug therapy: Secondary | ICD-10-CM

## 2015-04-16 DIAGNOSIS — E785 Hyperlipidemia, unspecified: Secondary | ICD-10-CM

## 2015-04-16 DIAGNOSIS — I10 Essential (primary) hypertension: Secondary | ICD-10-CM

## 2015-04-16 LAB — CBC WITH DIFFERENTIAL/PLATELET
BASOS PCT: 1 % (ref 0–1)
Basophils Absolute: 0.1 10*3/uL (ref 0.0–0.1)
EOS ABS: 0.3 10*3/uL (ref 0.0–0.7)
Eosinophils Relative: 3 % (ref 0–5)
HCT: 38.6 % (ref 36.0–46.0)
Hemoglobin: 12.7 g/dL (ref 12.0–15.0)
Lymphocytes Relative: 43 % (ref 12–46)
Lymphs Abs: 4.5 10*3/uL — ABNORMAL HIGH (ref 0.7–4.0)
MCH: 29.3 pg (ref 26.0–34.0)
MCHC: 32.9 g/dL (ref 30.0–36.0)
MCV: 88.9 fL (ref 78.0–100.0)
MPV: 9.1 fL (ref 8.6–12.4)
Monocytes Absolute: 0.5 10*3/uL (ref 0.1–1.0)
Monocytes Relative: 5 % (ref 3–12)
NEUTROS PCT: 48 % (ref 43–77)
Neutro Abs: 5 10*3/uL (ref 1.7–7.7)
PLATELETS: 405 10*3/uL — AB (ref 150–400)
RBC: 4.34 MIL/uL (ref 3.87–5.11)
RDW: 13.4 % (ref 11.5–15.5)
WBC: 10.4 10*3/uL (ref 4.0–10.5)

## 2015-04-16 LAB — COMPLETE METABOLIC PANEL WITH GFR
ALBUMIN: 4 g/dL (ref 3.6–5.1)
ALK PHOS: 52 U/L (ref 33–115)
ALT: 23 U/L (ref 6–29)
AST: 16 U/L (ref 10–30)
BUN: 15 mg/dL (ref 7–25)
CO2: 22 mmol/L (ref 20–31)
Calcium: 9.4 mg/dL (ref 8.6–10.2)
Chloride: 104 mmol/L (ref 98–110)
Creat: 0.59 mg/dL (ref 0.50–1.10)
GFR, Est African American: >89 mL/min (ref >=60)
GFR, Est Non African American: >89 mL/min (ref >=60)
Glucose, Bld: 95 mg/dL (ref 65–99)
Potassium: 3.9 mmol/L (ref 3.5–5.3)
SODIUM: 141 mmol/L (ref 135–146)
TOTAL PROTEIN: 6.8 g/dL (ref 6.1–8.1)
Total Bilirubin: 1 mg/dL (ref 0.2–1.2)

## 2015-04-16 LAB — TSH: TSH: 0.986 u[IU]/mL (ref 0.350–4.500)

## 2015-04-16 LAB — LIPID PANEL
CHOL/HDL RATIO: 3.6 ratio (ref <=5.0)
CHOLESTEROL: 174 mg/dL (ref 125–200)
HDL: 49 mg/dL (ref >=46)
LDL Cholesterol: 105 mg/dL (ref <130)
TRIGLYCERIDES: 100 mg/dL (ref <150)
VLDL: 20 mg/dL (ref <30)

## 2015-04-17 LAB — VITAMIN D 25 HYDROXY (VIT D DEFICIENCY, FRACTURES): Vit D, 25-Hydroxy: 34 ng/mL (ref 30–100)

## 2015-04-18 LAB — HEMOGLOBIN A1C
Hgb A1c MFr Bld: 5.9 % — ABNORMAL HIGH (ref <5.7)
Mean Plasma Glucose: 123 mg/dL — ABNORMAL HIGH (ref <117)

## 2015-04-22 ENCOUNTER — Ambulatory Visit (INDEPENDENT_AMBULATORY_CARE_PROVIDER_SITE_OTHER): Payer: 59 | Admitting: Internal Medicine

## 2015-04-22 ENCOUNTER — Encounter: Payer: Self-pay | Admitting: Internal Medicine

## 2015-04-22 ENCOUNTER — Other Ambulatory Visit (HOSPITAL_COMMUNITY)
Admission: RE | Admit: 2015-04-22 | Discharge: 2015-04-22 | Disposition: A | Discharge status: Home / Self Care | Payer: 59 | Source: Ambulatory Visit | Attending: Internal Medicine | Admitting: Internal Medicine

## 2015-04-22 VITALS — BP 132/78 | HR 84 | Temp 98.4°F | Resp 20 | Ht 64.0 in | Wt 254.0 lb

## 2015-04-22 DIAGNOSIS — I10 Essential (primary) hypertension: Secondary | ICD-10-CM | POA: Diagnosis not present

## 2015-04-22 DIAGNOSIS — Z0000 Encounter for general adult medical examination without abnormal findings: Secondary | ICD-10-CM | POA: Diagnosis not present

## 2015-04-22 DIAGNOSIS — Z124 Encounter for screening for malignant neoplasm of cervix: Secondary | ICD-10-CM

## 2015-04-22 DIAGNOSIS — E119 Type 2 diabetes mellitus without complications: Secondary | ICD-10-CM

## 2015-04-22 DIAGNOSIS — Z01419 Encounter for gynecological examination (general) (routine) without abnormal findings: Secondary | ICD-10-CM | POA: Insufficient documentation

## 2015-04-22 DIAGNOSIS — Z8639 Personal history of other endocrine, nutritional and metabolic disease: Secondary | ICD-10-CM

## 2015-04-22 LAB — POCT URINALYSIS DIPSTICK
Bilirubin, UA: NEGATIVE
Blood, UA: NEGATIVE
Glucose, UA: NEGATIVE
Ketones, UA: NEGATIVE
Leukocytes, UA: NEGATIVE
Nitrite, UA: NEGATIVE
Protein, UA: NEGATIVE
Spec Grav, UA: >=1.03
Urobilinogen, UA: 0.2
pH, UA: 8

## 2015-04-22 MED ORDER — TRIAMCINOLONE ACETONIDE 0.1 % EX CREA: 1.0000 "application " | TOPICAL_CREAM | Freq: Two times a day (BID) | CUTANEOUS | Status: DC

## 2015-04-23 LAB — IRON AND TIBC
%SAT: 37 % (ref 11–50)
Iron: 112 ug/dL (ref 40–190)
TIBC: 303 ug/dL (ref 250–450)
UIBC: 191 ug/dL (ref 125–400)

## 2015-04-27 LAB — CYTOLOGY - PAP

## 2015-06-28 ENCOUNTER — Other Ambulatory Visit: Payer: Self-pay | Admitting: Internal Medicine

## 2015-07-04 NOTE — Progress Notes (Signed)
   Subjective:    Patient ID: Crystal Wallace, female    DOB: August 06, 1975, 40 y.o.   MRN: [id number]  HPI 40 year old Black Female with history of morbid obesity, essential hypertension, controlled type 2 diabetes mellitus in today for health maintenance exam and evaluation of medical issues. Patient learned recently that she will be losing her job at [employer] in the upcoming future but it may be a year or 2 away. This is due to the company being sold/merged with another company.  No known drug allergies  History of iron deficiency anemia related to uterine fibroids and menorrhagia. In June 2013 she underwent a robotic-assisted myomectomy. She now has a IUD. Last Pap smear was done at the [facility] in April 2013 and was normal.  History of fractured right fifth metatarsal 1997. Sprain left a couple 2010. History of allergic rhinitis.  Social history: She is single. She has a high school education. She did attend college but does not have college degree. Has one daughter, teenager. She is employed by [employer] as a Radiation protection practitioner. Does not smoke or consume alcohol.  Family history: Both parents with hypertension. Father with history of diabetes and kidney stones. Mother with history of hyperlipidemia.  Of note, father is all my card is 80 mg daily, amlodipine 10 mg daily, chlorthalidone 25 mg daily.  We chose Micardis for this patient because it worked well with her father and has worked well for her. However at times we've had trouble getting that prescription for her in a reasonable price.    Review of Systems  Constitutional: Negative.   HENT: Negative.        Objective:   Physical Exam  Constitutional: She is oriented to person, place, and time. She appears well-developed and well-nourished. No distress.  HENT:  Head: Normocephalic and atraumatic.  Right Ear: External ear normal.  Left Ear: External ear normal.  Mouth/Throat: Oropharynx is clear and moist. No  oropharyngeal exudate.  Eyes: Conjunctivae and EOM are normal. Pupils are equal, round, and reactive to light. Right eye exhibits no discharge. No scleral icterus.  Neck: Neck supple. No JVD present. No thyromegaly present.  Cardiovascular: Normal rate, regular rhythm, normal heart sounds and intact distal pulses.   No murmur heard. Pulmonary/Chest: Effort normal and breath sounds normal. She has no wheezes. She has no rales.  Breasts: pendulous, normal female without masses  Abdominal: Soft. Bowel sounds are normal. She exhibits no distension and no mass. There is no tenderness. There is no rebound and no guarding.  Genitourinary: Vagina normal. No vaginal discharge found.  Pap taken. Bimanual normal.  Musculoskeletal: She exhibits no edema.  Lymphadenopathy:    She has no cervical adenopathy.  Neurological: She is alert and oriented to person, place, and time. She has normal reflexes. No cranial nerve deficit. Coordination normal.  Skin: Skin is dry. No rash noted. She is not diaphoretic.  Psychiatric: She has a normal mood and affect. Her behavior is normal. Judgment and thought content normal.  Vitals reviewed.         Assessment & Plan:  Essential hypertension-stable on current regimen  Morbid obesity  Controlled type 2 diabetes mellitus  Remote history of iron deficiency anemia related to uterine fibroids and menorrhagia status post robotic-assisted myomectomy. Now with normal iron level and is not anemic.

## 2015-07-05 ENCOUNTER — Encounter: Payer: Self-pay | Admitting: Internal Medicine

## 2015-07-05 DIAGNOSIS — Z8639 Personal history of other endocrine, nutritional and metabolic disease: Secondary | ICD-10-CM | POA: Insufficient documentation

## 2015-07-05 DIAGNOSIS — E119 Type 2 diabetes mellitus without complications: Secondary | ICD-10-CM | POA: Insufficient documentation

## 2015-07-05 NOTE — Patient Instructions (Addendum)
Continue same medications and return in 6 months. Continue to watch diet exercise and lose weight. It was a pleasure to see you today.

## 2015-08-30 ENCOUNTER — Telehealth: Payer: Self-pay | Admitting: Internal Medicine

## 2015-08-30 NOTE — Telephone Encounter (Signed)
Crystal Wallace called saying she needs a refill of Hydrochlorothiazide. She has one pill left. She'd like the Rx sent to [facility] at [facility]. It's not listed in her current pharmacies used and she was unsure of the phone number and address of the location. If you have questions or concerns, please give her a call.  Pt's ph# [phone]  Thank you.

## 2015-08-30 NOTE — Telephone Encounter (Signed)
Give #90 with no refill 

## 2015-08-30 NOTE — Telephone Encounter (Signed)
Ok to refill? Next appt 6/13

## 2015-08-31 MED ORDER — HYDROCHLOROTHIAZIDE 25 MG PO TABS: 25.0000 mg | ORAL_TABLET | Freq: Every day | ORAL | Status: DC

## 2015-10-12 ENCOUNTER — Encounter: Payer: Self-pay | Admitting: Internal Medicine

## 2015-10-12 ENCOUNTER — Encounter (INDEPENDENT_AMBULATORY_CARE_PROVIDER_SITE_OTHER): Payer: Self-pay

## 2015-10-12 ENCOUNTER — Ambulatory Visit (INDEPENDENT_AMBULATORY_CARE_PROVIDER_SITE_OTHER): Payer: BLUE CROSS/BLUE SHIELD | Admitting: Internal Medicine

## 2015-10-12 VITALS — BP 132/84 | HR 80 | Temp 98.2°F | Resp 18 | Wt 275.0 lb

## 2015-10-12 DIAGNOSIS — J309 Allergic rhinitis, unspecified: Secondary | ICD-10-CM | POA: Diagnosis not present

## 2015-10-12 DIAGNOSIS — H6503 Acute serous otitis media, bilateral: Secondary | ICD-10-CM

## 2015-10-12 MED ORDER — PREDNISONE 10 MG PO TABS: ORAL_TABLET | ORAL | Status: DC

## 2015-10-12 MED ORDER — AMOXICILLIN 500 MG PO CAPS: 500.0000 mg | ORAL_CAPSULE | Freq: Three times a day (TID) | ORAL | Status: DC

## 2015-10-12 NOTE — Progress Notes (Signed)
   Subjective:    Patient ID: Crystal Wallace, female    DOB: [birth year], 40 y.o.   MRN: [id number]  HPI  40 year old Black  Female presents today with complaint of left ear pain. Had similar episode in October 2016 treated with amoxicillin. Says left ear and palate are itchy. No sore throat. No fever or chills. She has a history of diabetes mellitus treated with medication and not insulin. She has a history of hypertension.    Review of Systems see above     Objective:   Physical Exam  Both TMs are dull bilaterally. External ear canals are clear. Pharynx is very slightly injected without age she day. Neck supple without significant adenopathy. Chest clear to auscultation.      Assessment & Plan:  Acute bilateral otitis media  Allergic rhinitis  Plan: Amoxicillin 500 mg 3 times daily for 10 days. Out of work today. Prednisone taper going from 40 mg to 0 mg over 9 days. Is to taper by 10 mg every 2 days. She was scheduled come in next week for six-month recheck and fasting lab work. Since she is on prednisone, I would like to defer that 3 or 4 weeks.

## 2015-10-12 NOTE — Patient Instructions (Signed)
Amoxicillin 500 mg 3 times daily x 10 days. prednisone taper as directed . Out of work today

## 2015-10-15 ENCOUNTER — Telehealth: Payer: Self-pay | Admitting: Internal Medicine

## 2015-10-15 MED ORDER — HYDROCODONE-HOMATROPINE 5-1.5 MG/5ML PO SYRP
5.0000 mL | ORAL_SOLUTION | Freq: Three times a day (TID) | ORAL | Status: DC | PRN
Start: 2015-10-15 — End: 2015-11-04

## 2015-10-15 NOTE — Telephone Encounter (Signed)
Cannot call in narcotic cough syrup. Please order Hycodan and she can come pick up Rx

## 2015-10-15 NOTE — Telephone Encounter (Signed)
Patient states that when she was in on Tuesday that she wasn't having cough at that time.  She has since started having a bad cough.  She wants to know if you would please give her something for the cough?   Pharmacy:  [facility] @ [address]

## 2015-10-15 NOTE — Telephone Encounter (Signed)
Patient notified and she will pick up

## 2015-10-19 ENCOUNTER — Other Ambulatory Visit: Payer: 59 | Admitting: Internal Medicine

## 2015-10-21 ENCOUNTER — Ambulatory Visit: Payer: 59 | Admitting: Internal Medicine

## 2015-10-29 ENCOUNTER — Other Ambulatory Visit: Payer: Self-pay | Admitting: Internal Medicine

## 2015-11-01 ENCOUNTER — Other Ambulatory Visit: Payer: BLUE CROSS/BLUE SHIELD | Admitting: Internal Medicine

## 2015-11-01 DIAGNOSIS — E785 Hyperlipidemia, unspecified: Secondary | ICD-10-CM

## 2015-11-01 DIAGNOSIS — Z79899 Other long term (current) drug therapy: Secondary | ICD-10-CM

## 2015-11-01 DIAGNOSIS — E119 Type 2 diabetes mellitus without complications: Secondary | ICD-10-CM

## 2015-11-01 LAB — HEMOGLOBIN A1C
HEMOGLOBIN A1C: 6.6 % — AB (ref <5.7)
MEAN PLASMA GLUCOSE: 143 mg/dL

## 2015-11-01 LAB — LIPID PANEL
Cholesterol: 203 mg/dL — ABNORMAL HIGH (ref 125–200)
HDL: 46 mg/dL (ref >=46)
LDL CALC: 121 mg/dL (ref <130)
Total CHOL/HDL Ratio: 4.4 Ratio (ref <=5.0)
Triglycerides: 181 mg/dL — ABNORMAL HIGH (ref <150)
VLDL: 36 mg/dL — ABNORMAL HIGH (ref <30)

## 2015-11-01 LAB — HEPATIC FUNCTION PANEL
ALT: 20 U/L (ref 6–29)
AST: 13 U/L (ref 10–30)
Albumin: 4.1 g/dL (ref 3.6–5.1)
Alkaline Phosphatase: 56 U/L (ref 33–115)
BILIRUBIN DIRECT: 0.1 mg/dL (ref <=0.2)
BILIRUBIN INDIRECT: 0.6 mg/dL (ref 0.2–1.2)
TOTAL PROTEIN: 6.6 g/dL (ref 6.1–8.1)
Total Bilirubin: 0.7 mg/dL (ref 0.2–1.2)

## 2015-11-04 ENCOUNTER — Encounter: Payer: Self-pay | Admitting: Internal Medicine

## 2015-11-04 ENCOUNTER — Ambulatory Visit (INDEPENDENT_AMBULATORY_CARE_PROVIDER_SITE_OTHER): Payer: BLUE CROSS/BLUE SHIELD | Admitting: Internal Medicine

## 2015-11-04 VITALS — BP 126/78 | HR 99 | Temp 98.8°F | Resp 18 | Ht 64.0 in | Wt 275.5 lb

## 2015-11-04 DIAGNOSIS — E119 Type 2 diabetes mellitus without complications: Secondary | ICD-10-CM | POA: Diagnosis not present

## 2015-11-04 DIAGNOSIS — I10 Essential (primary) hypertension: Secondary | ICD-10-CM | POA: Diagnosis not present

## 2015-11-04 DIAGNOSIS — E8881 Metabolic syndrome: Secondary | ICD-10-CM

## 2015-11-04 DIAGNOSIS — E785 Hyperlipidemia, unspecified: Secondary | ICD-10-CM

## 2015-11-04 NOTE — Progress Notes (Signed)
   Subjective:    Patient ID: Crystal Wallace, female    DOB: 09-12-1975, 40 y.o.   MRN: [id number]  HPI Patient here today for six-month recheck. She is morbidly obese and by our scale she's gained a considerable amount of weight over the past 6 months. I weighed her at 289 1/2 pounds today and previously she was 254 pounds in December 2016. This is an amazing amount of weight gain. She admits she's not been following a strict diet recently. There've been some graduation parties she has attended. Her hemoglobin A1c has increased to 6.6% which is disconcerting as well. She's having some left neck pain. Has had this previously. Has Flexeril at home from an old prescription to take for that. Has some pain in her left ankle  several years ago and it has not completely ever become pain-free. She has hypertension and hyperlipidemia.  Her blood pressure is excellent 126/78. Total cholesterol has increased from 174-203. Triglycerides have increased from 100-181. LDL cholesterol has increased from 105-121. I'm truly worried about her lack of motivation with her medical issues at this point. Hemoglobin A1c has increased from 5.9% 6 months ago to 6.6%.  I do not understand why she has not been trying.    Review of Systems see above     Objective:   Physical Exam  Skin warm and dry. Nodes none. Palpable muscle spasm left paracervical area. Chest clear. Cardiac exam regular rate and rhythm normal S1 and S2.      Assessment & Plan:  Morbid obesity-significant weight gain of 35 pounds apparently  Controlled type 2 diabetes without complication but control is not as good as it was 6 months ago  Hyperlipidemia-increase in lipids since 6 months ago  Metabolic syndrome  Essential hypertension-this fortunately is stable  Plan: Once again encouraged diet exercise and weight loss. This time return in 3 months for follow-up with hemoglobin A1c, lipid panel, no liver functions, weight check and blood pressure  check

## 2015-11-04 NOTE — Patient Instructions (Signed)
Please work on diet exercise and weight loss. Return in 3 months for follow-up.

## 2015-11-05 LAB — MICROALBUMIN, URINE: MICROALB UR: 2.2 mg/dL

## 2015-12-24 ENCOUNTER — Other Ambulatory Visit: Payer: Self-pay

## 2015-12-24 MED ORDER — HYDROCHLOROTHIAZIDE 25 MG PO TABS
25.0000 mg | ORAL_TABLET | Freq: Every day | ORAL | 1 refills | Status: DC
Start: 2015-12-24 — End: 2016-09-11

## 2016-01-07 ENCOUNTER — Other Ambulatory Visit: Payer: Self-pay | Admitting: Internal Medicine

## 2016-01-24 ENCOUNTER — Other Ambulatory Visit: Payer: Self-pay | Admitting: Internal Medicine

## 2016-02-03 ENCOUNTER — Other Ambulatory Visit: Payer: BLUE CROSS/BLUE SHIELD | Admitting: Internal Medicine

## 2016-02-04 ENCOUNTER — Ambulatory Visit: Payer: BLUE CROSS/BLUE SHIELD | Admitting: Internal Medicine

## 2016-02-07 ENCOUNTER — Other Ambulatory Visit (INDEPENDENT_AMBULATORY_CARE_PROVIDER_SITE_OTHER): Payer: BLUE CROSS/BLUE SHIELD | Admitting: Internal Medicine

## 2016-02-07 DIAGNOSIS — E785 Hyperlipidemia, unspecified: Secondary | ICD-10-CM

## 2016-02-07 DIAGNOSIS — E119 Type 2 diabetes mellitus without complications: Secondary | ICD-10-CM

## 2016-02-07 LAB — LIPID PANEL
CHOLESTEROL: 164 mg/dL (ref 125–200)
HDL: 48 mg/dL (ref >=46)
LDL Cholesterol: 85 mg/dL (ref <130)
Total CHOL/HDL Ratio: 3.4 Ratio (ref <=5.0)
Triglycerides: 154 mg/dL — ABNORMAL HIGH (ref <150)
VLDL: 31 mg/dL — AB (ref <30)

## 2016-02-08 LAB — HEMOGLOBIN A1C
HEMOGLOBIN A1C: 6.6 % — AB (ref <5.7)
Mean Plasma Glucose: 143 mg/dL

## 2016-02-10 ENCOUNTER — Other Ambulatory Visit: Payer: BLUE CROSS/BLUE SHIELD | Admitting: Internal Medicine

## 2016-02-11 ENCOUNTER — Encounter: Payer: Self-pay | Admitting: Internal Medicine

## 2016-02-11 ENCOUNTER — Ambulatory Visit (INDEPENDENT_AMBULATORY_CARE_PROVIDER_SITE_OTHER): Payer: Self-pay | Admitting: Internal Medicine

## 2016-02-11 VITALS — BP 136/86 | HR 99 | Temp 99.2°F | Wt 289.5 lb

## 2016-02-11 DIAGNOSIS — J301 Allergic rhinitis due to pollen: Secondary | ICD-10-CM

## 2016-02-11 DIAGNOSIS — I10 Essential (primary) hypertension: Secondary | ICD-10-CM

## 2016-02-11 DIAGNOSIS — E782 Mixed hyperlipidemia: Secondary | ICD-10-CM

## 2016-02-11 DIAGNOSIS — E119 Type 2 diabetes mellitus without complications: Secondary | ICD-10-CM

## 2016-02-11 DIAGNOSIS — E8881 Metabolic syndrome: Secondary | ICD-10-CM

## 2016-02-11 MED ORDER — AMOXICILLIN 500 MG PO CAPS: 500.0000 mg | ORAL_CAPSULE | Freq: Three times a day (TID) | ORAL | 0 refills | Status: DC

## 2016-02-11 MED ORDER — SIMVASTATIN 20 MG PO TABS: 20.0000 mg | ORAL_TABLET | Freq: Every day | ORAL | 3 refills | Status: DC

## 2016-02-11 MED ORDER — METHYLPREDNISOLONE ACETATE 80 MG/ML IJ SUSP
80.0000 mg | Freq: Once | INTRAMUSCULAR | Status: AC
  Administered 2016-02-11: 80 mg via INTRAMUSCULAR

## 2016-02-11 MED ORDER — SITAGLIPTIN PHOSPHATE 100 MG PO TABS: 100.0000 mg | ORAL_TABLET | Freq: Every day | ORAL | 2 refills | Status: DC

## 2016-02-11 NOTE — Patient Instructions (Signed)
Increase Zocor to 20 mg daily. Diet and exercise. Start Januvia 100 mg daily. RTC 8 weeks. Amoxicillin 500 mg 3 times daily x 10 days. Depomedrol 80 mg IM

## 2016-02-11 NOTE — Progress Notes (Signed)
   Subjective:    Patient ID: Crystal Wallace, female    DOB: 1975-11-25, 40 y.o.   MRN: [id number]  HPI 40 year old Female here today to follow-up on diabetes mellitus, hypertension, hyperlipidemia. She also has new problem today which is left ear pain.  In June 2017 she weighed 275 pounds. Now weighs 289-1/2 pounds. I'm concerned about her weight gain. She's not dieting and exercise being as well as she did a while back.  She has hypertension and hyperlipidemia in addition to type 2 diabetes mellitus. In June, her hemoglobin A1c was 6.6%. It is still 6.6%. She is compliant with her medications. Her blood pressure is stable. Her triglycerides have improved from 181-154. Total cholesterol has improved from 203-164.    Review of Systems see above      Objective:   Physical Exam Neck is supple without JVD thyromegaly or carotid bruits. Chest clear. Cardiac exam regular rate and rhythm normal S1 and S2. Extremities without edema. Her left TM is full and dull.Right TM is full.       Assessment & Plan:  Acute bilateral otitis media  Controlled type 2  diabetes mellitus-hemoglobin A1c unchanged from last visit  Morbid obesity-significant weight gain since last visit  Essential hypertension-blood pressure 136/86  Hyperlipidemia  Plan: Encourage her to give more attention to diet and exercise. For otitis media amoxicillin 500 mg 3 times daily for 10 days. Follow-up in December Depo-Medrol 80 mg IM.We are increasing Zocor to 20 mg daily. She will start Januvia 100 mg daily and return in 8 weeks.

## 2016-02-14 ENCOUNTER — Other Ambulatory Visit: Payer: Self-pay | Admitting: *Deleted

## 2016-02-14 ENCOUNTER — Telehealth: Payer: Self-pay | Admitting: Internal Medicine

## 2016-02-14 MED ORDER — SITAGLIPTIN PHOSPHATE 100 MG PO TABS: 100.0000 mg | ORAL_TABLET | Freq: Every day | ORAL | 2 refills | Status: DC

## 2016-02-14 NOTE — Telephone Encounter (Signed)
Prescribe Januvia 100 mg daily #30 with 2 refills

## 2016-02-14 NOTE — Telephone Encounter (Signed)
Patient said the pharmacy did not receive the prescription that she was suppose to take with the Metformin.  Pharmacy is [facility] [address]

## 2016-02-14 NOTE — Telephone Encounter (Signed)
done

## 2016-02-19 ENCOUNTER — Other Ambulatory Visit: Payer: Self-pay | Admitting: Internal Medicine

## 2016-04-06 ENCOUNTER — Telehealth: Payer: Self-pay | Admitting: Internal Medicine

## 2016-04-06 NOTE — Telephone Encounter (Signed)
Patient scheduled for fasting Lipid, Liver, A1C on Tuesday, 12/5. 2 Month f/u, BP check on Thursday, 12/7.   Patient didn't get signed up for her insurance in time.  For this reason, she doesn't currently have insurance benefits until after January, 2018.  States she will have to cancel her appointments (above) until after she has her insurance benefits back.  States she can't afford to come otherwise.  Advised we would note her account likewise.  Made Dr. Renold Genta aware verbally.

## 2016-04-11 ENCOUNTER — Other Ambulatory Visit: Payer: BLUE CROSS/BLUE SHIELD | Admitting: Internal Medicine

## 2016-04-13 ENCOUNTER — Ambulatory Visit: Payer: BLUE CROSS/BLUE SHIELD | Admitting: Internal Medicine

## 2016-05-19 ENCOUNTER — Other Ambulatory Visit: Payer: BLUE CROSS/BLUE SHIELD | Admitting: Internal Medicine

## 2016-05-19 ENCOUNTER — Other Ambulatory Visit: Payer: Self-pay | Admitting: Internal Medicine

## 2016-05-19 DIAGNOSIS — E119 Type 2 diabetes mellitus without complications: Secondary | ICD-10-CM

## 2016-05-19 DIAGNOSIS — Z5181 Encounter for therapeutic drug level monitoring: Secondary | ICD-10-CM

## 2016-05-19 DIAGNOSIS — E7849 Other hyperlipidemia: Secondary | ICD-10-CM

## 2016-05-19 LAB — HEPATIC FUNCTION PANEL
ALBUMIN: 4.2 g/dL (ref 3.6–5.1)
ALK PHOS: 65 U/L (ref 33–115)
ALT: 24 U/L (ref 6–29)
AST: 21 U/L (ref 10–30)
BILIRUBIN TOTAL: 0.7 mg/dL (ref 0.2–1.2)
Bilirubin, Direct: 0.1 mg/dL (ref <=0.2)
Indirect Bilirubin: 0.6 mg/dL (ref 0.2–1.2)
Total Protein: 7 g/dL (ref 6.1–8.1)

## 2016-05-19 LAB — HEMOGLOBIN A1C
HEMOGLOBIN A1C: 6.2 % — AB (ref <5.7)
MEAN PLASMA GLUCOSE: 131 mg/dL

## 2016-05-19 LAB — LIPID PANEL
CHOLESTEROL: 153 mg/dL (ref <200)
HDL: 43 mg/dL — AB (ref >50)
LDL Cholesterol: 82 mg/dL (ref <100)
TRIGLYCERIDES: 142 mg/dL (ref <150)
Total CHOL/HDL Ratio: 3.6 Ratio (ref <5.0)
VLDL: 28 mg/dL (ref <30)

## 2016-05-19 NOTE — Progress Notes (Signed)
Lab only 

## 2016-05-23 ENCOUNTER — Ambulatory Visit (INDEPENDENT_AMBULATORY_CARE_PROVIDER_SITE_OTHER): Payer: BLUE CROSS/BLUE SHIELD | Admitting: Internal Medicine

## 2016-05-23 ENCOUNTER — Encounter: Payer: Self-pay | Admitting: Internal Medicine

## 2016-05-23 VITALS — BP 140/80 | HR 84 | Wt 273.0 lb

## 2016-05-23 DIAGNOSIS — E119 Type 2 diabetes mellitus without complications: Secondary | ICD-10-CM | POA: Diagnosis not present

## 2016-05-23 DIAGNOSIS — E8881 Metabolic syndrome: Secondary | ICD-10-CM | POA: Diagnosis not present

## 2016-05-23 DIAGNOSIS — E782 Mixed hyperlipidemia: Secondary | ICD-10-CM

## 2016-05-23 NOTE — Progress Notes (Signed)
   Subjective:    Patient ID: Crystal Wallace, female    DOB: Apr 16, 1976, 41 y.o.   MRN: [id number]  HPI  41 year old Black Female for follow up of HTN,Weight management, hyperlipidemia and Controlled type 2 diabetes mellitus. Still has job for the present time. Not much word on that. Has been told that job could be eliminated sometime in the future. Blood pressure is 140/80 today. She weighs 273 pounds. In October she weighed 283 1/2 pounds. I'm pleased with her weight loss. She needs to continue this on a steady basis. She is now 2 pounds less than what she weighed in July 2017.  She's been improvement in hemoglobin A1c from 6.6% 6.2%. Lipid panel and liver functions are normal. She has a low HDL of 43.   Review of Systems     Objective:   Physical Exam  Pulmonary/Chest: Effort normal and breath sounds normal. No respiratory distress. She has no wheezes. She has no rales.  Musculoskeletal: She exhibits no edema.          Assessment & Plan:  Essential hypertension-need to continue to monitor blood pressure closely  Morbid obesity-10.5 pound weight loss. Need to encourage more weight loss.  Hyperlipidemia-stable  Controlled type 2 diabetes mellitus-hemoglobin A1c stable at 6.2%. Continue diet exercise and weight loss efforts.  Plan: Return July 2018 for physical exam.

## 2016-06-02 ENCOUNTER — Other Ambulatory Visit: Payer: Self-pay

## 2016-06-02 ENCOUNTER — Telehealth: Payer: Self-pay | Admitting: Internal Medicine

## 2016-06-02 MED ORDER — TELMISARTAN 80 MG PO TABS: 80.0000 mg | ORAL_TABLET | Freq: Every day | ORAL | 0 refills | Status: DC

## 2016-06-02 MED ORDER — AMLODIPINE BESYLATE 5 MG PO TABS: ORAL_TABLET | ORAL | 0 refills | Status: DC

## 2016-06-02 NOTE — Telephone Encounter (Signed)
Patient calling for refill on her medications.  Needs Amlodipine and Micardis.  She does 30 days only.    Pharmacy:  [facility] @ [place].    Best # for contact:  [phone]

## 2016-06-02 NOTE — Telephone Encounter (Signed)
Please refill both meds

## 2016-06-02 NOTE — Telephone Encounter (Signed)
Done

## 2016-06-06 NOTE — Patient Instructions (Signed)
Continue diet exercise and weight loss efforts. Continue same medications. Return in July for physical exam.

## 2016-07-05 ENCOUNTER — Other Ambulatory Visit: Payer: Self-pay | Admitting: Internal Medicine

## 2016-07-08 ENCOUNTER — Other Ambulatory Visit: Payer: Self-pay | Admitting: Internal Medicine

## 2016-09-11 ENCOUNTER — Other Ambulatory Visit: Payer: Self-pay

## 2016-09-11 MED ORDER — HYDROCHLOROTHIAZIDE 25 MG PO TABS: 25.0000 mg | ORAL_TABLET | Freq: Every day | ORAL | 0 refills | Status: DC

## 2016-10-08 ENCOUNTER — Other Ambulatory Visit: Payer: Self-pay | Admitting: Internal Medicine

## 2016-11-14 ENCOUNTER — Other Ambulatory Visit: Payer: BLUE CROSS/BLUE SHIELD | Admitting: Internal Medicine

## 2016-11-17 ENCOUNTER — Encounter: Payer: BLUE CROSS/BLUE SHIELD | Admitting: Internal Medicine

## 2017-06-13 ENCOUNTER — Other Ambulatory Visit: Payer: Self-pay

## 2017-06-13 DIAGNOSIS — E785 Hyperlipidemia, unspecified: Secondary | ICD-10-CM

## 2017-06-13 DIAGNOSIS — Z1321 Encounter for screening for nutritional disorder: Secondary | ICD-10-CM

## 2017-06-13 DIAGNOSIS — Z0000 Encounter for general adult medical examination without abnormal findings: Secondary | ICD-10-CM

## 2017-06-13 DIAGNOSIS — I10 Essential (primary) hypertension: Secondary | ICD-10-CM

## 2017-06-13 DIAGNOSIS — E118 Type 2 diabetes mellitus with unspecified complications: Secondary | ICD-10-CM

## 2017-06-13 DIAGNOSIS — Z1329 Encounter for screening for other suspected endocrine disorder: Secondary | ICD-10-CM

## 2017-07-10 ENCOUNTER — Other Ambulatory Visit: Payer: Managed Care, Other (non HMO) | Admitting: Internal Medicine

## 2017-07-10 DIAGNOSIS — Z0000 Encounter for general adult medical examination without abnormal findings: Secondary | ICD-10-CM

## 2017-07-10 DIAGNOSIS — I10 Essential (primary) hypertension: Secondary | ICD-10-CM

## 2017-07-10 DIAGNOSIS — E785 Hyperlipidemia, unspecified: Secondary | ICD-10-CM

## 2017-07-10 DIAGNOSIS — Z1329 Encounter for screening for other suspected endocrine disorder: Secondary | ICD-10-CM

## 2017-07-10 DIAGNOSIS — E118 Type 2 diabetes mellitus with unspecified complications: Secondary | ICD-10-CM

## 2017-07-10 DIAGNOSIS — Z1321 Encounter for screening for nutritional disorder: Secondary | ICD-10-CM

## 2017-07-11 LAB — CBC WITH DIFFERENTIAL/PLATELET
BASOS ABS: 66 {cells}/uL (ref 0–200)
Basophils Relative: 0.5 %
Eosinophils Absolute: 197 cells/uL (ref 15–500)
Eosinophils Relative: 1.5 %
HEMATOCRIT: 38.7 % (ref 35.0–45.0)
Hemoglobin: 13.3 g/dL (ref 11.7–15.5)
LYMPHS ABS: 4782 {cells}/uL — AB (ref 850–3900)
MCH: 29.4 pg (ref 27.0–33.0)
MCHC: 34.4 g/dL (ref 32.0–36.0)
MCV: 85.6 fL (ref 80.0–100.0)
MPV: 9.7 fL (ref 7.5–12.5)
Monocytes Relative: 5.7 %
NEUTROS PCT: 55.8 %
Neutro Abs: 7310 cells/uL (ref 1500–7800)
PLATELETS: 392 10*3/uL (ref 140–400)
RBC: 4.52 10*6/uL (ref 3.80–5.10)
RDW: 12.7 % (ref 11.0–15.0)
TOTAL LYMPHOCYTE: 36.5 %
WBC: 13.1 10*3/uL — AB (ref 3.8–10.8)
WBCMIX: 747 {cells}/uL (ref 200–950)

## 2017-07-11 LAB — COMPLETE METABOLIC PANEL WITH GFR
AG Ratio: 1.8 (calc) (ref 1.0–2.5)
ALBUMIN MSPROF: 4.6 g/dL (ref 3.6–5.1)
ALKALINE PHOSPHATASE (APISO): 100 U/L (ref 33–115)
ALT: 16 U/L (ref 6–29)
AST: 13 U/L (ref 10–30)
BILIRUBIN TOTAL: 1 mg/dL (ref 0.2–1.2)
BUN: 15 mg/dL (ref 7–25)
CHLORIDE: 100 mmol/L (ref 98–110)
CO2: 22 mmol/L (ref 20–32)
CREATININE: 0.65 mg/dL (ref 0.50–1.10)
Calcium: 9.7 mg/dL (ref 8.6–10.2)
GFR, EST AFRICAN AMERICAN: 128 mL/min/{1.73_m2} (ref >=60)
GFR, Est Non African American: 110 mL/min/{1.73_m2} (ref >=60)
GLOBULIN: 2.5 g/dL (ref 1.9–3.7)
GLUCOSE: 114 mg/dL — AB (ref 65–99)
Potassium: 3.3 mmol/L — ABNORMAL LOW (ref 3.5–5.3)
SODIUM: 137 mmol/L (ref 135–146)
TOTAL PROTEIN: 7.1 g/dL (ref 6.1–8.1)

## 2017-07-11 LAB — LIPID PANEL
CHOL/HDL RATIO: 3.8 (calc) (ref <5.0)
CHOLESTEROL: 195 mg/dL (ref <200)
HDL: 52 mg/dL (ref >50)
LDL CHOLESTEROL (CALC): 112 mg/dL — AB
NON-HDL CHOLESTEROL (CALC): 143 mg/dL — AB (ref <130)
TRIGLYCERIDES: 191 mg/dL — AB (ref <150)

## 2017-07-11 LAB — TSH: TSH: 1.63 mIU/L

## 2017-07-11 LAB — HEMOGLOBIN A1C
EAG (MMOL/L): 7.6 (calc)
Hgb A1c MFr Bld: 6.4 % of total Hgb — ABNORMAL HIGH (ref <5.7)
MEAN PLASMA GLUCOSE: 137 (calc)

## 2017-07-11 LAB — MICROALBUMIN / CREATININE URINE RATIO
CREATININE, URINE: 234 mg/dL (ref 20–275)
MICROALB UR: 15.8 mg/dL
Microalb Creat Ratio: 68 mcg/mg creat — ABNORMAL HIGH (ref <30)

## 2017-07-11 LAB — VITAMIN D 25 HYDROXY (VIT D DEFICIENCY, FRACTURES): Vit D, 25-Hydroxy: 16 ng/mL — ABNORMAL LOW (ref 30–100)

## 2017-07-13 ENCOUNTER — Other Ambulatory Visit (HOSPITAL_COMMUNITY)
Admission: RE | Admit: 2017-07-13 | Discharge: 2017-07-13 | Disposition: A | Discharge status: Home / Self Care | Payer: Managed Care, Other (non HMO) | Source: Ambulatory Visit | Attending: Internal Medicine | Admitting: Internal Medicine

## 2017-07-13 ENCOUNTER — Ambulatory Visit (INDEPENDENT_AMBULATORY_CARE_PROVIDER_SITE_OTHER): Payer: Managed Care, Other (non HMO) | Admitting: Internal Medicine

## 2017-07-13 ENCOUNTER — Encounter: Payer: Self-pay | Admitting: Internal Medicine

## 2017-07-13 VITALS — BP 160/90 | HR 88 | Ht 64.0 in | Wt 285.0 lb

## 2017-07-13 DIAGNOSIS — I10 Essential (primary) hypertension: Secondary | ICD-10-CM

## 2017-07-13 DIAGNOSIS — E8881 Metabolic syndrome: Secondary | ICD-10-CM

## 2017-07-13 DIAGNOSIS — Z113 Encounter for screening for infections with a predominantly sexual mode of transmission: Secondary | ICD-10-CM | POA: Insufficient documentation

## 2017-07-13 DIAGNOSIS — N898 Other specified noninflammatory disorders of vagina: Secondary | ICD-10-CM

## 2017-07-13 DIAGNOSIS — E876 Hypokalemia: Secondary | ICD-10-CM

## 2017-07-13 DIAGNOSIS — E782 Mixed hyperlipidemia: Secondary | ICD-10-CM

## 2017-07-13 DIAGNOSIS — Z0000 Encounter for general adult medical examination without abnormal findings: Secondary | ICD-10-CM | POA: Diagnosis not present

## 2017-07-13 DIAGNOSIS — N760 Acute vaginitis: Secondary | ICD-10-CM

## 2017-07-13 DIAGNOSIS — B9689 Other specified bacterial agents as the cause of diseases classified elsewhere: Secondary | ICD-10-CM | POA: Diagnosis not present

## 2017-07-13 DIAGNOSIS — A5901 Trichomonal vulvovaginitis: Secondary | ICD-10-CM | POA: Insufficient documentation

## 2017-07-13 DIAGNOSIS — Z124 Encounter for screening for malignant neoplasm of cervix: Secondary | ICD-10-CM | POA: Insufficient documentation

## 2017-07-13 DIAGNOSIS — J0100 Acute maxillary sinusitis, unspecified: Secondary | ICD-10-CM | POA: Diagnosis not present

## 2017-07-13 DIAGNOSIS — E119 Type 2 diabetes mellitus without complications: Secondary | ICD-10-CM

## 2017-07-13 DIAGNOSIS — R8290 Unspecified abnormal findings in urine: Secondary | ICD-10-CM

## 2017-07-13 LAB — POCT URINALYSIS DIPSTICK
APPEARANCE: ABNORMAL
BILIRUBIN UA: NEGATIVE
Glucose, UA: NEGATIVE
Ketones, UA: NEGATIVE
Nitrite, UA: NEGATIVE
Odor: ABNORMAL
PH UA: 6 (ref 5.0–8.0)
SPEC GRAV UA: 1.025 (ref 1.010–1.025)
UROBILINOGEN UA: 0.2 U/dL

## 2017-07-13 LAB — POCT WET PREP (WET MOUNT)

## 2017-07-13 MED ORDER — AZITHROMYCIN 250 MG PO TABS: ORAL_TABLET | ORAL | 0 refills | Status: DC

## 2017-07-13 MED ORDER — POTASSIUM CHLORIDE CRYS ER 20 MEQ PO TBCR: 20.0000 meq | EXTENDED_RELEASE_TABLET | Freq: Every day | ORAL | 3 refills | Status: DC

## 2017-07-13 MED ORDER — METFORMIN HCL ER 750 MG PO TB24
ORAL_TABLET | ORAL | 5 refills | Status: DC
Start: 2017-07-13 — End: 2018-01-03

## 2017-07-13 MED ORDER — TELMISARTAN 80 MG PO TABS: ORAL_TABLET | ORAL | 11 refills | Status: DC

## 2017-07-13 MED ORDER — CLINDAMYCIN PHOSPHATE 2 % VA CREA: 1.0000 | TOPICAL_CREAM | Freq: Every day | VAGINAL | 0 refills | Status: DC

## 2017-07-13 MED ORDER — SIMVASTATIN 40 MG PO TABS: 40.0000 mg | ORAL_TABLET | Freq: Every day | ORAL | 3 refills | Status: DC

## 2017-07-13 NOTE — Progress Notes (Signed)
   Subjective:    Patient ID: Crystal Wallace, female    DOB: July 23, 1975, 42 y.o.   MRN: [id number]  HPI 42 year old female for health maintenance exam and evaluation of medical issues.  She has a history of morbid obesity, essential hypertension, controlled type 2 diabetes mellitus.  Patient still has job at [employer] for the present time the plan is closing.  No known drug allergies  History of iron deficiency anemia related to uterine fibroids and menorrhagia.  In June 2013 she underwent a robotic assisted myomectomy.  History of fractured right fifth metatarsal 1997.  History of allergic rhinitis.  Social history: She is single.  She has a high school education.  She did attend college but did not graduate.  She has 1 daughter, teenager.  Does not smoke or consume alcohol.  Family history: Both parents with hypertension.  Father with history of diabetes and kidney stones.  Mother with history of hyperlipidemia.  We chose Micardis for this patient because it worked well with her father and has worked well for her.  I cannot seem to motivate her to diet exercise and lose weight.    Review of Systems recent respiratory infection and ear discomfort     Objective:   Physical Exam        Assessment & Plan:  Acute  maxillary sinusitis - treat with Zithromax Z-PAK.  White blood cell count elevated at 13,100  Bacterial vaginosis-treat with Cleocin vaginal cream nightly times 7 days  Essential hypertension- stable with Norvasc and Micardis as well as HCTZ  Diabetes mellitus-hemoglobin A1c is 6.4% and previously was 6.2% continue metformin  Hyperlipidemia-triglycerides are elevated at 191.  LDL is 112.  Stay with statin medication at 40 mg of Zocor.  Watch diet.  Morbid obesity needs to be motivated to diet and exercise-  Hypokalemia-potassium low at 3.3-needs to take potassium supplement  Follow-up in April.  I think she can do better if she is motivated.

## 2017-07-14 LAB — URINE CULTURE
MICRO NUMBER:: 90299933
SPECIMEN QUALITY: ADEQUATE

## 2017-07-18 LAB — CYTOLOGY - PAP
CHLAMYDIA, DNA PROBE: NEGATIVE
Diagnosis: NEGATIVE
HPV: NOT DETECTED
NEISSERIA GONORRHEA: NEGATIVE
Trichomonas: POSITIVE — AB

## 2017-07-19 ENCOUNTER — Other Ambulatory Visit: Payer: Self-pay

## 2017-07-19 LAB — CERVICOVAGINAL ANCILLARY ONLY: Herpes: NEGATIVE

## 2017-07-19 MED ORDER — METRONIDAZOLE 500 MG PO TABS: 500.0000 mg | ORAL_TABLET | Freq: Three times a day (TID) | ORAL | 0 refills | Status: AC

## 2017-07-19 MED ORDER — AMLODIPINE BESYLATE 5 MG PO TABS: ORAL_TABLET | ORAL | 0 refills | Status: DC

## 2017-07-30 ENCOUNTER — Other Ambulatory Visit: Payer: Self-pay | Admitting: Internal Medicine

## 2017-07-30 DIAGNOSIS — E785 Hyperlipidemia, unspecified: Secondary | ICD-10-CM

## 2017-07-30 DIAGNOSIS — I10 Essential (primary) hypertension: Secondary | ICD-10-CM

## 2017-08-07 ENCOUNTER — Other Ambulatory Visit (INDEPENDENT_AMBULATORY_CARE_PROVIDER_SITE_OTHER): Payer: Managed Care, Other (non HMO) | Admitting: Internal Medicine

## 2017-08-07 ENCOUNTER — Ambulatory Visit (INDEPENDENT_AMBULATORY_CARE_PROVIDER_SITE_OTHER): Payer: Managed Care, Other (non HMO) | Admitting: Internal Medicine

## 2017-08-07 ENCOUNTER — Encounter: Payer: Self-pay | Admitting: Internal Medicine

## 2017-08-07 VITALS — BP 160/100 | HR 83 | Temp 98.3°F | Ht 64.0 in | Wt 282.0 lb

## 2017-08-07 DIAGNOSIS — I10 Essential (primary) hypertension: Secondary | ICD-10-CM

## 2017-08-07 DIAGNOSIS — H6502 Acute serous otitis media, left ear: Secondary | ICD-10-CM

## 2017-08-07 DIAGNOSIS — J069 Acute upper respiratory infection, unspecified: Secondary | ICD-10-CM | POA: Diagnosis not present

## 2017-08-07 DIAGNOSIS — H1033 Unspecified acute conjunctivitis, bilateral: Secondary | ICD-10-CM

## 2017-08-07 DIAGNOSIS — E785 Hyperlipidemia, unspecified: Secondary | ICD-10-CM

## 2017-08-07 LAB — HEPATIC FUNCTION PANEL
AG Ratio: 1.5 (calc) (ref 1.0–2.5)
ALKALINE PHOSPHATASE (APISO): 88 U/L (ref 33–115)
ALT: 26 U/L (ref 6–29)
AST: 19 U/L (ref 10–30)
Albumin: 4.4 g/dL (ref 3.6–5.1)
Bilirubin, Direct: 0.2 mg/dL (ref 0.0–0.2)
Globulin: 2.9 g/dL (calc) (ref 1.9–3.7)
Indirect Bilirubin: 0.9 mg/dL (calc) (ref 0.2–1.2)
TOTAL PROTEIN: 7.3 g/dL (ref 6.1–8.1)
Total Bilirubin: 1.1 mg/dL (ref 0.2–1.2)

## 2017-08-07 LAB — LIPID PANEL
CHOL/HDL RATIO: 3.8 (calc) (ref <5.0)
Cholesterol: 172 mg/dL (ref <200)
HDL: 45 mg/dL — ABNORMAL LOW (ref >50)
LDL CHOLESTEROL (CALC): 104 mg/dL — AB
NON-HDL CHOLESTEROL (CALC): 127 mg/dL (ref <130)
TRIGLYCERIDES: 135 mg/dL (ref <150)

## 2017-08-07 LAB — POTASSIUM: Potassium: 4.1 mmol/L (ref 3.5–5.3)

## 2017-08-07 MED ORDER — OFLOXACIN 0.3 % OP SOLN
OPHTHALMIC | 0 refills | Status: DC
Start: 2017-08-07 — End: 2017-09-14

## 2017-08-07 MED ORDER — HYDROCODONE-HOMATROPINE 5-1.5 MG/5ML PO SYRP: 5.0000 mL | ORAL_SOLUTION | Freq: Three times a day (TID) | ORAL | 0 refills | Status: DC | PRN

## 2017-08-07 MED ORDER — AMOXICILLIN 500 MG PO CAPS: 500.0000 mg | ORAL_CAPSULE | Freq: Three times a day (TID) | ORAL | 0 refills | Status: DC

## 2017-08-07 MED ORDER — METHYLPREDNISOLONE ACETATE 80 MG/ML IJ SUSP
80.0000 mg | Freq: Once | INTRAMUSCULAR | Status: AC
  Administered 2017-08-07: 80 mg via INTRAMUSCULAR

## 2017-08-07 NOTE — Progress Notes (Signed)
   Subjective:    Patient ID: Crystal Wallace, female    DOB: 03/11/1976, 42 y.o.   MRN: [id number]  HPI Patient complaining of left ear pain, congested cough and coryza.  Has had some chills.  Feels that she may have a sinus infection.  Cough is been somewhat productive.  No documented fever.  History of hypertension and blood pressure is elevated today at 160/100.  She plans to go to work today.  She is been working 4 days a week 10-hour days and off on Fridays.  She was here March 8 for health maintenance exam and was complaining of some left ear pain at that time.  Apparently had had a recent respiratory infection.  Was thought to have sinusitis and was treated with a Z-Pak.  Her white count was elevated at 13,100.      Review of Systems she has history of diabetes mellitus, hyperlipidemia and hypertension as well as morbid obesity.     Objective:   Physical Exam Conjunctivae are injected bilaterally with coryza.  Left TM is dull.  Right TM is dull.  The left external ear canal looks to be inflamed slightly.  Pharynx is clear.  Neck is supple without adenopathy.  She has a congested cough.  Chest clear to auscultation without rales or wheezing.       Assessment & Plan:  Acute left serous otitis media  Coryza  Acute upper respiratory infection  Acute bilateral conjunctivitis  Plan: Amoxicillin 500 mg 3 times a day for 10 days.  Depo-Medrol 80 mg IM.  Hycodan 1 teaspoon p.o. every 8 hours as needed cough.  Offered to excuse her from work today but she declined.  She has follow-up appointment here on Friday regarding other medical issues.  Ofloxacin ophthalmic eyedrops 2 drops in each eye 4 times a day for 5 days.

## 2017-08-07 NOTE — Patient Instructions (Signed)
Amoxicillin 500 mg 3 times a day for 10 days.  Depo-Medrol 80 mg IM.  Hycodan 1 teaspoon p.o. every 8 hours as needed cough.

## 2017-08-10 ENCOUNTER — Encounter: Payer: Self-pay | Admitting: Internal Medicine

## 2017-08-10 ENCOUNTER — Ambulatory Visit (INDEPENDENT_AMBULATORY_CARE_PROVIDER_SITE_OTHER): Payer: Managed Care, Other (non HMO) | Admitting: Internal Medicine

## 2017-08-10 VITALS — BP 160/100 | HR 86 | Ht 64.0 in | Wt 282.0 lb

## 2017-08-10 DIAGNOSIS — E785 Hyperlipidemia, unspecified: Secondary | ICD-10-CM

## 2017-08-10 DIAGNOSIS — E119 Type 2 diabetes mellitus without complications: Secondary | ICD-10-CM | POA: Diagnosis not present

## 2017-08-10 DIAGNOSIS — H6502 Acute serous otitis media, left ear: Secondary | ICD-10-CM

## 2017-08-10 DIAGNOSIS — I10 Essential (primary) hypertension: Secondary | ICD-10-CM

## 2017-08-17 ENCOUNTER — Encounter: Payer: Self-pay | Admitting: Internal Medicine

## 2017-08-17 ENCOUNTER — Ambulatory Visit (INDEPENDENT_AMBULATORY_CARE_PROVIDER_SITE_OTHER): Payer: Managed Care, Other (non HMO) | Admitting: Internal Medicine

## 2017-08-17 VITALS — BP 160/100 | HR 76 | Ht 64.0 in | Wt 282.0 lb

## 2017-08-17 DIAGNOSIS — H6592 Unspecified nonsuppurative otitis media, left ear: Secondary | ICD-10-CM | POA: Diagnosis not present

## 2017-08-17 DIAGNOSIS — I10 Essential (primary) hypertension: Secondary | ICD-10-CM | POA: Diagnosis not present

## 2017-08-17 MED ORDER — PREDNISONE 10 MG PO TABS: ORAL_TABLET | ORAL | 0 refills | Status: DC

## 2017-08-17 MED ORDER — DOXYCYCLINE HYCLATE 100 MG PO TABS: 100.0000 mg | ORAL_TABLET | Freq: Two times a day (BID) | ORAL | 0 refills | Status: DC

## 2017-08-17 MED ORDER — FUROSEMIDE 40 MG PO TABS: 40.0000 mg | ORAL_TABLET | Freq: Every day | ORAL | 3 refills | Status: DC

## 2017-08-17 NOTE — Patient Instructions (Addendum)
Change to doxycycline 100 mg daily x 10 days. Lasix 40 mg daily. Tapering course of prednisone as directed 6-5-4-3-2-1.  Follow-up May 3.  Start Lasix 40 mg daily for hypertension and discontinue HCTZ.

## 2017-08-17 NOTE — Progress Notes (Signed)
   Subjective:    Patient ID: Crystal Wallace, female    DOB: July 28, 1975, 42 y.o.   MRN: [id number]  HPI 42 year old Black Female for follow up on HTN. At last visit, had URI symptoms and otalgia.  Was finishing a course of amoxicillin.  Ear does not feel normal yet.  Still hurts.    Review of Systems     Objective:   Physical Exam Left TM remains full but not red.  Blood pressure remains elevated at 160/100       Assessment & Plan:  Persistent elevation in blood pressure.  Stop HCTZ and start Lasix 40 mg daily.  Continue potassium supplement and follow-up in May.  Doxycycline 100 mg twice daily for 10 days.  Tapering course of prednisone as directed 1 from 60 mg to 0 mg over 7 days.

## 2017-08-18 ENCOUNTER — Other Ambulatory Visit: Payer: Self-pay | Admitting: Internal Medicine

## 2017-08-30 ENCOUNTER — Other Ambulatory Visit: Payer: Self-pay | Admitting: Internal Medicine

## 2017-08-30 DIAGNOSIS — Z1231 Encounter for screening mammogram for malignant neoplasm of breast: Secondary | ICD-10-CM

## 2017-09-01 NOTE — Patient Instructions (Addendum)
Lab visit only.  Follow-up April 12.

## 2017-09-01 NOTE — Progress Notes (Signed)
Labs drawn

## 2017-09-03 NOTE — Progress Notes (Addendum)
   Subjective:    Patient ID: Crystal Wallace, female    DOB: Dec 10, 1975, 42 y.o.   MRN: [id number]  HPI Crystal Wallace was here March 8 for health maintenance exam and evaluation of medical issues.  Potassium was low at 3.3.  Hemoglobin A1c was 6.4% and previously was 6.2% on metformin.  LDL cholesterol was 112 and triglycerides were elevated at 191.  She was told to continue Zocor 40 mg daily and watch her diet.  She is back today for follow-up.  Potassium is now normal with potassium supplementation.  Triglycerides are now normal at 135 and LDL cholesterol has improved from 112 to 104.  Liver functions are normal.  On April 2 she was seen here acutely with an acute URI treated with Depo-Medrol and amoxicillin.  Does not feel 100% better from that.  She had conjunctivitis treated with antibiotic eyedrops on April 2 and cough treated with Hycodan  Review of Systems says ears still feel full and somewhat painful     Objective:   Physical Exam Left TM  full but not red.  Pharynx is clear.  Neck supple.  Chest clear to auscultation.  Cardiac exam regular rate and rhythm.  Extremities without pitting edema.       Assessment & Plan:  Persistent acute left serous otitis media  Hypertension- elevated at 160/100-may need medication change  Impaired glucose tolerance-needs to work on diet and exercise  Hyperlipidemia-continue statin medication  Plan: Finish course of amoxicillin and follow-up in 1 week for elevated blood pressure

## 2017-09-03 NOTE — Patient Instructions (Addendum)
Continue potassium supplement.  Continue amoxicillin.  Follow-up in 1 week regarding elevated blood pressure.  Watch diet.

## 2017-09-07 ENCOUNTER — Encounter: Payer: Self-pay | Admitting: Internal Medicine

## 2017-09-07 ENCOUNTER — Ambulatory Visit (INDEPENDENT_AMBULATORY_CARE_PROVIDER_SITE_OTHER): Payer: Managed Care, Other (non HMO) | Admitting: Internal Medicine

## 2017-09-07 VITALS — BP 160/100 | HR 90 | Ht 64.0 in | Wt 281.0 lb

## 2017-09-07 DIAGNOSIS — I10 Essential (primary) hypertension: Secondary | ICD-10-CM | POA: Diagnosis not present

## 2017-09-07 MED ORDER — OLMESARTAN MEDOXOMIL 40 MG PO TABS: 40.0000 mg | ORAL_TABLET | Freq: Every day | ORAL | 0 refills | Status: DC

## 2017-09-10 ENCOUNTER — Other Ambulatory Visit: Payer: Self-pay

## 2017-09-10 MED ORDER — IRBESARTAN 300 MG PO TABS: 300.0000 mg | ORAL_TABLET | Freq: Every day | ORAL | 0 refills | Status: DC

## 2017-09-10 NOTE — Addendum Note (Signed)
Addended by: Mady Haagensen on: 09/10/2017 05:09 PM   Modules accepted: Orders

## 2017-09-14 ENCOUNTER — Encounter: Payer: Self-pay | Admitting: Internal Medicine

## 2017-09-14 ENCOUNTER — Ambulatory Visit (INDEPENDENT_AMBULATORY_CARE_PROVIDER_SITE_OTHER): Payer: Managed Care, Other (non HMO) | Admitting: Internal Medicine

## 2017-09-14 VITALS — BP 170/110 | HR 86 | Ht 64.0 in | Wt 281.0 lb

## 2017-09-14 DIAGNOSIS — I10 Essential (primary) hypertension: Secondary | ICD-10-CM

## 2017-09-14 DIAGNOSIS — H1033 Unspecified acute conjunctivitis, bilateral: Secondary | ICD-10-CM

## 2017-09-14 DIAGNOSIS — M542 Cervicalgia: Secondary | ICD-10-CM

## 2017-09-14 MED ORDER — CYCLOBENZAPRINE HCL 10 MG PO TABS: 10.0000 mg | ORAL_TABLET | Freq: Every day | ORAL | 0 refills | Status: DC

## 2017-09-14 MED ORDER — CARVEDILOL 12.5 MG PO TABS: 12.5000 mg | ORAL_TABLET | Freq: Two times a day (BID) | ORAL | 0 refills | Status: DC

## 2017-09-14 MED ORDER — OFLOXACIN 0.3 % OP SOLN: OPHTHALMIC | 0 refills | Status: DC

## 2017-09-14 NOTE — Patient Instructions (Signed)
Flexeril 10 mg at bedtime for neck pain.  Ocuflox ophthalmic drops 2 drops in each eye 4 times a day for 5 days for bilateral conjunctivitis.  Add Coreg 12.5 mg twice daily.  Follow-up in 1 week.  Continue Avapro and other antihypertensive medications.

## 2017-09-14 NOTE — Progress Notes (Signed)
   Subjective:    Patient ID: Crystal Wallace, female    DOB: Feb 08, 1976, 42 y.o.   MRN: [id number]  HPI She took her blood pressure medications around 7 AM.  Despite that and starting Avapro in place of myocarditis her blood pressure is still elevated.  Olmesartan was not covered with her insurance plan.  If they would cover Avapro and that was the choice that was given.  Her neck is hurting her today.  She has a history of prior neck surgery.  She is sore over her C7 area to the left.  Is tender to touch there.  I recommended Flexeril at bedtime.  She is complaining of itchy eyes.  Conjunctivae are inflamed bilaterally.  Have refilled Ocuflox ophthalmic drops for her to use and advised her to take an antihistamine as well.  She simply does not feel well today.    Review of Systems see above     Objective:   Physical Exam  Conjunctivae inflamed bilaterally.  She has some mild exophthalmos.  Chest clear.  Cardiac exam regular rate and rhythm.      Assessment & Plan:  Persistently elevated blood pressure.  Switched from Micardis to Avapro with no change in blood pressure readings.  Add Coreg 12.5 mg twice daily and follow-up next week.  If blood pressures not improving, she will be referred to cardiology.  Ocuflox ophthalmic drops 2 drops in each eye 4 times a day for 5 days for presumed conjunctivitis.  Take antihistamine for itching of the eyes.  Neck pain.  Prescribed Flexeril 10 mg at bedtime.

## 2017-09-21 ENCOUNTER — Encounter: Payer: Self-pay | Admitting: Internal Medicine

## 2017-09-21 ENCOUNTER — Ambulatory Visit (INDEPENDENT_AMBULATORY_CARE_PROVIDER_SITE_OTHER): Payer: Managed Care, Other (non HMO) | Admitting: Internal Medicine

## 2017-09-21 ENCOUNTER — Ambulatory Visit
Admission: RE | Admit: 2017-09-21 | Discharge: 2017-09-21 | Disposition: A | Discharge status: Home / Self Care | Payer: Managed Care, Other (non HMO) | Source: Ambulatory Visit | Attending: Internal Medicine | Admitting: Internal Medicine

## 2017-09-21 VITALS — BP 160/110 | HR 78 | Ht 64.0 in | Wt 281.0 lb

## 2017-09-21 DIAGNOSIS — M542 Cervicalgia: Secondary | ICD-10-CM | POA: Diagnosis not present

## 2017-09-21 DIAGNOSIS — E119 Type 2 diabetes mellitus without complications: Secondary | ICD-10-CM

## 2017-09-21 DIAGNOSIS — E785 Hyperlipidemia, unspecified: Secondary | ICD-10-CM

## 2017-09-21 DIAGNOSIS — E8881 Metabolic syndrome: Secondary | ICD-10-CM | POA: Diagnosis not present

## 2017-09-21 DIAGNOSIS — I10 Essential (primary) hypertension: Secondary | ICD-10-CM | POA: Diagnosis not present

## 2017-09-21 MED ORDER — CYCLOBENZAPRINE HCL 10 MG PO TABS: 10.0000 mg | ORAL_TABLET | Freq: Every day | ORAL | 11 refills | Status: DC

## 2017-09-21 MED ORDER — PREDNISONE 10 MG PO TABS: ORAL_TABLET | ORAL | 0 refills | Status: DC

## 2017-09-21 NOTE — Patient Instructions (Signed)
Take Flexeril and prednisone as directed.  Cardiology consultation requested for management of hypertension

## 2017-09-21 NOTE — Progress Notes (Signed)
   Subjective:    Patient ID: Crystal Wallace, female    DOB: 1976-03-05, 42 y.o.   MRN: [id number]  HPI 42 year old for follow up HTN but today complaining of neck pain. Had C5-C6 ACDF  by Dr. Lynann Bologna for neck pain August 2012.  She had C5-C6 stenosis on the left with  radiculopathy.  Initially presented to the emergency department with neck pain in May 2012.  From time to time has recurrence of neck pain.  We have been trying to get olmesartan approved for her.  Her co-pay was $40 and she was unable to afford it.  We tried to obtaining a discount card online and it seems that her co-pay with that card would be $80.  It is very frustrating to know what to do at this point.  Her blood pressure remains poorly controlled.  Neck pain may be playing a part in it today.  She denies noncompliance with medications.  Once again reviewed weight issues with her.  In 2013 she weighed 261 pounds, in 2014 she weighed 284 pounds, in 2016 she weighed 269 pounds, and 2018 she weighed 273 pounds, in March of this year she weighed 285 pounds and today weighs 281 pounds.  I have given her Dr. Migdalia Dk name and contact number for weight management.  She currently is employed at [employer] but says her job will be ending in the future but she does not know when.  She is looking for other employment.  She had been on Micardis for some time but that did not seem to be controlling her blood pressure recently and I wanted to change to Benicar.  Only Avapro would be approved and it 300 mg her blood pressure is still not controlled.  She  controlled type 2 diabetes mellitus and is on metformin.  Also have her on carvedilol 12.5 mg twice daily, amlodipine 5 mg daily, and Lasix 40 mg daily.  She takes Zocor 40 mg daily.  She takes a potassium supplement.  She has a Mirena device.    Review of Systems see above     Objective:   Physical Exam She has palpable muscle spasm on the left.  No weakness in the left upper extremity.   Chest clear.  Cardiac exam regular rate and rhythm.  Extremities without pitting edema       Assessment & Plan:  Left-sided neck pain with history of C5-C6 disc  Poorly controlled hypertension-refer to Cardiology for assistance in management of blood pressure since I cannot get olmesartan approved and she cannot afford the co-pays.  Morbid obesity  Type 2 diabetes mellitus with hemoglobin A1c in March 6 0.4%  Hyperlipidemia  Plan: I need some assistance with her blood pressure management.  We will refer to cardiology for further evaluation.  Her TSH was checked in March and was within normal limits.  Today for neck pain I Wallace her a short course of prednisone going from 60 mg to 0 mg over 7 days and Flexeril 10 mg at bedtime.

## 2017-10-04 NOTE — Patient Instructions (Addendum)
Benicar not covered by insurance plan.  Add Avapro 300 mg daily.  Discontinue myocarditis.  Follow-up May 10.

## 2017-10-04 NOTE — Progress Notes (Signed)
   Subjective:    Patient ID: Crystal Wallace, female    DOB: 18-Aug-1975, 42 y.o.   MRN: [id number]  HPI she is here today to follow-up on hypertension.  At last visit her blood pressure was significantly elevated at 160/100 and was still complaining of ear pain.  She was started on  Lasix 40 mg daily and was told to stop HCTZ.  Was treated with doxycycline and a tapering course of prednisone.  Here today for recheck on ear and blood pressure    Review of Systems     Objective:   Physical Exam TMs are now clear.  Neck supple.  Chest clear.  Cardiac exam regular rate and rhythm.       Assessment & Plan:  Instead of Micardis try Benicar 40 mg daily.  Addendum: Apparently Benicar not covered by insurance plan and she cannot afford it.  Pharmacist suggest [place].  We will try 300 mg daily Avapro and continue other medications as previously prescribed.  Return May 10.

## 2017-10-07 ENCOUNTER — Other Ambulatory Visit: Payer: Self-pay | Admitting: Internal Medicine

## 2017-10-08 NOTE — Telephone Encounter (Signed)
Please see when Cardiology is going to see her. I do not see an appt.

## 2017-10-09 NOTE — Telephone Encounter (Signed)
Called the cardiologist office to schedule patient they said they have about 400 referrals and the first available appointment is on 11/12/17 at 8:40am with Shelton Silvas Chirstopher at [address], appt was made.

## 2017-10-11 ENCOUNTER — Other Ambulatory Visit: Payer: Self-pay | Admitting: Internal Medicine

## 2017-10-12 ENCOUNTER — Other Ambulatory Visit: Payer: Self-pay | Admitting: Internal Medicine

## 2017-10-14 ENCOUNTER — Other Ambulatory Visit: Payer: Self-pay | Admitting: Internal Medicine

## 2017-11-07 ENCOUNTER — Other Ambulatory Visit: Payer: Self-pay | Admitting: Internal Medicine

## 2017-11-12 ENCOUNTER — Encounter: Payer: Self-pay | Admitting: Cardiology

## 2017-11-12 ENCOUNTER — Ambulatory Visit (INDEPENDENT_AMBULATORY_CARE_PROVIDER_SITE_OTHER): Payer: Managed Care, Other (non HMO) | Admitting: Cardiology

## 2017-11-12 VITALS — BP 134/82 | HR 80 | Ht 63.5 in | Wt 288.2 lb

## 2017-11-12 DIAGNOSIS — I10 Essential (primary) hypertension: Secondary | ICD-10-CM | POA: Diagnosis not present

## 2017-11-12 DIAGNOSIS — E782 Mixed hyperlipidemia: Secondary | ICD-10-CM

## 2017-11-12 DIAGNOSIS — E119 Type 2 diabetes mellitus without complications: Secondary | ICD-10-CM

## 2017-11-12 NOTE — Progress Notes (Signed)
Cardiology Office Note:    Date:  11/12/2017   ID:  Crystal Wallace, DOB [birth year], MRN [id number]  PCP:  Crystal Showers, MD  Cardiologist:  No primary care provider on file.   Referring MD: Crystal Showers, MD   No chief complaint on file.   History of Present Illness:    Crystal Wallace is a 42 y.o. female with a hx of hypertension, type II diabetes (dx 2014), hyperlipidemia and morbid obesity who is seen in consult at the request of Crystal Wallace for management of resistant hypertension.  First seen by Crystal Wallace for hypertension in 12/2011 at the age of 35. On amlodipine initially. Since that time, has been on amlodipine, HCTZ, telmisartan->losartan-->irbesartan due to insurance, HCTZ-->furosemide, and carvedilol.  Currently on amlodipine 5 mg daily, carvedilol 12.5 mg twice daily, furosemide 40 mg daily, benicar 40 mg daily, potassium 20 mEq daily. On simvastatin 40 mg daily, cannot increased amlodipine while on this dose.  From my review of the records, has not had work up for secondary hypertension.  Her concerns: blood pressure  First time she took blood pressure medications was in 1999 after the birth of her daughter. Not sure what she was on. Stopped taking it for many years. Started back on around the time of her fibroid treatment.  Currently stopped irbesartan as it didn't affect her blood pressure, got samples of benicar and has been on for 4 days. BP well controlled today.   Does not drink caffeine, no tobacco.   Denies headaches, vision changes, chest pain, shortness of breath, edema, PND, orthopnea, syncope.  FH: father has high blood pressure, not sure how well controlled it is. Had CABG about 17 years ago. On a blood thinner. Crystal Wallace with history of MI. [name] has history of MI in her 41s. Brother has possible glaucoma but otherwise health, sister is generally healthy (hysterectome at age 86).   Past Medical History:  Diagnosis Date  . Diabetes ([place])   . HTN  (hypertension)   . Hyperlipidemia     Past Surgical History:  Procedure Laterality Date  .  c section  1999  . adenoid removal age 42    . cervical 5 and cervical 6 neck surgery  12-07-2010  . ROBOT ASSISTED MYOMECTOMY  10/20/2011   Procedure: ROBOTIC ASSISTED MYOMECTOMY;  Surgeon: Crystal Crocker, MD;  Location: [facility];  Service: Gynecology;  Laterality: N/A;    Current Medications: No outpatient medications have been marked as taking for the 11/12/17 encounter (Appointment) with Crystal Dresser, MD.   Current Outpatient Medications on File Prior to Visit  Medication Sig  . amLODipine (NORVASC) 5 MG tablet TAKE 1 TABLET(5 MG) BY MOUTH DAILY  . carvedilol (COREG) 12.5 MG tablet TAKE 1 TABLET(12.5 MG) BY MOUTH TWICE DAILY WITH A MEAL  . cholecalciferol (VITAMIN D) 1000 UNITS tablet Take 1,000 Units by mouth daily.  . cyclobenzaprine (FLEXERIL) 10 MG tablet TAKE 1 TABLET(10 MG) BY MOUTH AT BEDTIME  . furosemide (LASIX) 40 MG tablet Take 1 tablet (40 mg total) by mouth daily.  Marland Kitchen glucose blood test strip Use as instructed  . glucose monitoring kit (FREESTYLE) monitoring kit 1 each by Does not apply route as needed for other.  . ibuprofen (ADVIL,MOTRIN) 200 MG tablet Take 600 mg by mouth every 6 (six) hours as needed. For pain  . irbesartan (AVAPRO) 300 MG tablet TAKE 1 TABLET BY MOUTH EVERY DAY  . levonorgestrel (MIRENA) 20 MCG/24HR IUD 1 each by  Intrauterine route once.  . metFORMIN (GLUCOPHAGE-XR) 750 MG 24 hr tablet TAKE 1 TABLET(750 MG) BY MOUTH DAILY WITH BREAKFAST  . potassium chloride SA (K-DUR,KLOR-CON) 20 MEQ tablet TAKE 1 TABLET(20 MEQ) BY MOUTH DAILY  . simvastatin (ZOCOR) 40 MG tablet Take 1 tablet (40 mg total) by mouth at bedtime.   No current facility-administered medications on file prior to visit.      Allergies:   Patient has no known allergies.   Social History   Socioeconomic History  . Marital status: Single    Spouse name: Not on file  . Number of  children: Not on file  . Years of education: Not on file  . Highest education level: Not on file  Occupational History  . Not on file  Social Needs  . Financial resource strain: Not on file  . Food insecurity:    Worry: Not on file    Inability: Not on file  . Transportation needs:    Medical: Not on file    Non-medical: Not on file  Tobacco Use  . Smoking status: Never Smoker  . Smokeless tobacco: Never Used  Substance and Sexual Activity  . Alcohol use: No  . Drug use: No  . Sexual activity: Not on file  Lifestyle  . Physical activity:    Days per week: Not on file    Minutes per session: Not on file  . Stress: Not on file  Relationships  . Social connections:    Talks on phone: Not on file    Gets together: Not on file    Attends religious service: Not on file    Active member of club or organization: Not on file    Attends meetings of clubs or organizations: Not on file    Relationship status: Not on file  Other Topics Concern  . Not on file  Social History Narrative  . Not on file     Family History: The patient's family history includes Arthritis in her mother; Diabetes in her sister; Hypertension in her sister.  father has high blood pressure, not sure how well controlled it is. Had CABG about 17 years ago. On a blood thinner. Crystal Wallace with history of MI. [name] has history of MI in her 83s. Brother has possible glaucoma but otherwise health, sister is generally healthy (hysterectome at age 63).  ROS:   Please see the history of present illness.  Additional pertinent ROS: ROS   EKGs/Labs/Other Studies Reviewed:    The following studies were reviewed today: EKG 10-Jul-2011  EKG:  EKG is ordered today.  The ekg ordered today demonstrates normal sinus rhythm  Recent Labs: 07/10/2017: BUN 15; Creat 0.65; Hemoglobin 13.3; Platelets 392; Sodium 137; TSH 1.63 08/07/2017: ALT 26; Potassium 4.1  Recent Lipid Panel    Component Value Date/Time   CHOL 172 08/07/2017 0933    TRIG 135 08/07/2017 0933   HDL 45 (L) 08/07/2017 0933   CHOLHDL 3.8 08/07/2017 0933   VLDL 28 05/19/2016 1128   LDLCALC 104 (H) 08/07/2017 0933    Physical Exam:    VS:  BP 134/82   Pulse 80   Ht 5' 3.5" (1.613 m)   Wt 288 lb 3.2 oz (130.7 kg)   BMI 50.25 kg/m     Wt Readings from Last 3 Encounters:  11/12/17 288 lb 3.2 oz (130.7 kg)  09/21/17 281 lb (127.5 kg)  09/14/17 281 lb (127.5 kg)    GEN: Well nourished, well developed in no acute  distress HEENT: Normal. Hirsutism over chin and neck NECK: No JVD; No carotid bruits. No appreciable acanthosis nigracans LYMPHATICS: No lymphadenopathy CARDIAC: regular rhythm, normal S1 and S2, no murmurs, rubs, gallops RESPIRATORY:  Clear to auscultation without rales, wheezing or rhonchi  ABDOMEN: Soft, non-tender, non-distended. Did not appreciate any bruits. MUSCULOSKELETAL:  No edema; No deformity  SKIN: Warm and dry NEUROLOGIC:  Alert and oriented x 3 PSYCHIATRIC:  Normal affect   ASSESSMENT:    1. Resistant hypertension   2. Type 2 diabetes mellitus without complication, without long-term current use of insulin ([place])   3. Mixed hyperlipidemia   4. Morbid obesity ([place])    PLAN:    In order of problems listed above: 1. Resistant hypertension: currently on ARB (olmesartan for 4 days), beta blocker (carvedilol), diuretic (furosemide), and calcium channel blocker (amlodipine). Has tried other medications in the past without success. Given her young age at onset and some of her peak blood pressures, I am concerned that she may have secondary hypertension. Her hypokalemia and high pressures are most concerning for hyperaldosteronism.  -check a PRA/PRC and renal panel today for hyperaldosteronism -if suggestive of hyperaldosteronism, spironolactone would be the drug of choice. Would hold ARB when starting spironolactone and then get renal panel 1 week after initiation to monitor potassium and renal function -she is tolerating  amlodipine and simvastatin, but would prefer to change off of simvastatin prior to increasing dose of amlodipine given interaction risk. Will make small changes each time, but this should be change #2. -continue beta blocker and diuretic for now -no audible bruits today, but if workup negative for hyperaldosteronism will need to pursue evaluation of other causes of resistant hypertension -she has a BP cuff at home, instructed on how to take measurements and given a BP log -will have her follow up with PharmD in 6 weeks to assess her BP control. Instructed her to bring her home cuff to that visit to check accuracy. -we will follow up in 3 mos to monitor her response  2. Hyperlipidemia: on simvastatin 40 mg and tolerating. Given risk of interaction with amlodipine, would change this prior to making adjustments to amlodipine dosing  3. Morbid obesity: body habitus and medical comorbidities suggestive of metabolic syndrome. Once blood pressure better controlled, will need to address -counseled on diet and exercise -she is amenable to continuing to address weight goals in the future.    Medication Adjustments/Labs and Tests Ordered: Current medicines are reviewed at length with the patient today.  Concerns regarding medicines are outlined above.  Orders Placed This Encounter  Procedures  . Basic metabolic panel  . Aldosterone + renin activity w/ ratio  . EKG 12-Lead   No orders of the defined types were placed in this encounter.   Patient Instructions  Medication Instructions: Your physician recommends that you continue on your current medications as directed.    If you need a refill on your cardiac medications before your next appointment, please call your pharmacy.   Labwork: Your physician recommends that you return for lab work in: Today ( BMP, Aldosterone & Renin)      Follow-Up: Your physician wants you to follow-up in 6 week with [facility]. Bring Blood pressure  cuff and log.  Your physician recommends that you schedule a follow-up appointment in: 3 months with Dr. [name] Gave.   Special Instructions:    Thank you for choosing [facility]care at [facility]!!       Signed, Crystal Dresser, MD PhD 11/12/2017 7:46 AM  [facility]

## 2017-11-12 NOTE — Patient Instructions (Addendum)
Medication Instructions: Your physician recommends that you continue on your current medications as directed.    If you need a refill on your cardiac medications before your next appointment, please call your pharmacy.   Labwork: Your physician recommends that you return for lab work in: Today ( BMP, Aldosterone & Renin)      Follow-Up: Your physician wants you to follow-up in 6 week with Hypertension Clinic. Bring Blood pressure cuff and log.  Your physician recommends that you schedule a follow-up appointment in: 3 months with Dr. [name] Gave.   Special Instructions:    Thank you for choosing Heartcare at [facility]!!

## 2017-11-15 LAB — BASIC METABOLIC PANEL
BUN/Creatinine Ratio: 20 (ref 9–23)
BUN: 14 mg/dL (ref 6–24)
CALCIUM: 9.4 mg/dL (ref 8.7–10.2)
CO2: 19 mmol/L — AB (ref 20–29)
CREATININE: 0.7 mg/dL (ref 0.57–1.00)
Chloride: 101 mmol/L (ref 96–106)
GFR calc Af Amer: 124 mL/min/{1.73_m2} (ref >59)
GFR, EST NON AFRICAN AMERICAN: 108 mL/min/{1.73_m2} (ref >59)
GLUCOSE: 147 mg/dL — AB (ref 65–99)
Potassium: 3.8 mmol/L (ref 3.5–5.2)
SODIUM: 141 mmol/L (ref 134–144)

## 2017-11-15 LAB — ALDOSTERONE + RENIN ACTIVITY W/ RATIO
ALDOS/RENIN RATIO: 6 (ref 0.0–30.0)
ALDOSTERONE: 4.9 ng/dL (ref 0.0–30.0)
RENIN: 0.811 ng/mL/h (ref 0.167–5.380)

## 2017-11-22 ENCOUNTER — Encounter: Payer: Self-pay | Admitting: Internal Medicine

## 2017-11-22 ENCOUNTER — Encounter (INDEPENDENT_AMBULATORY_CARE_PROVIDER_SITE_OTHER): Payer: Managed Care, Other (non HMO)

## 2017-11-23 MED ORDER — OFLOXACIN 0.3 % OP SOLN: OPHTHALMIC | 0 refills | Status: DC

## 2017-11-23 NOTE — Telephone Encounter (Signed)
Refill Ocuflox ophthalmic drops per pt request

## 2017-12-03 ENCOUNTER — Ambulatory Visit
Admission: RE | Admit: 2017-12-03 | Discharge: 2017-12-03 | Disposition: A | Discharge status: Home / Self Care | Payer: Managed Care, Other (non HMO) | Source: Ambulatory Visit | Attending: Obstetrics and Gynecology | Admitting: Obstetrics and Gynecology

## 2017-12-03 ENCOUNTER — Other Ambulatory Visit: Payer: Self-pay | Admitting: Obstetrics and Gynecology

## 2017-12-03 DIAGNOSIS — T8389XD Other specified complication of genitourinary prosthetic devices, implants and grafts, subsequent encounter: Principal | ICD-10-CM

## 2017-12-03 DIAGNOSIS — T8332XD Displacement of intrauterine contraceptive device, subsequent encounter: Secondary | ICD-10-CM

## 2017-12-04 ENCOUNTER — Other Ambulatory Visit: Payer: Self-pay | Admitting: Internal Medicine

## 2017-12-04 ENCOUNTER — Telehealth: Payer: Self-pay | Admitting: Internal Medicine

## 2017-12-04 MED ORDER — OLMESARTAN MEDOXOMIL 40 MG PO TABS: 40.0000 mg | ORAL_TABLET | Freq: Every day | ORAL | 1 refills | Status: DC

## 2017-12-04 MED ORDER — AMLODIPINE BESYLATE 5 MG PO TABS: ORAL_TABLET | ORAL | 1 refills | Status: DC

## 2017-12-04 NOTE — Telephone Encounter (Signed)
Refill request was sent for Irbesartan.  Dr. Renold Genta wanted clarification on what patient was taking.  Spoke with patient.  She states that she's not taking the Irbasartan.  However, she's currently not taking the Olmesartan either because she says that she cannot afford $83 for the Rx.  She said that they gave her 9 pills and she has used those 9 pills.  She has an appointment with the pharmacist at [facility] on 8/23 and Dr. [name] Gave told her that they should be able to provide her with some sort of assistance with the Olmesartan and she's hoping at that time perhaps she can get back on the Olmesartan.    She said when she went to her GYN appointment last week, her BP was like 138/??  She couldn't really remember the diastolic number.  Tried to help patient understand the importance of taking BP meds on a regular basis; however it seems that finances are the issue at the present time.    Told patient she can go online and search Olmesartan Online Coupons and see if she can find something.  And, she may also have to look at other pharmacy's besides [facility] to see if she can find it cheaper than $83.  Told her to call around to [facility], [place], [place].  Even suggested she try some [facility] pharamcy's to see if they can find it cheaper for her since they are not part of a chain.  Gave her a couple of names to call.    She is instructed to call me back if she cannot find any coupons online to see if we need to do something further to get her along as she doesn't go back to see Dr. [name] Gave until 10/25.

## 2017-12-04 NOTE — Telephone Encounter (Signed)
Please call pt.   Irbesartan was changed by Cardiologist to Benicar and should NOT be refilled. Does she have any Benicar? ? She is due for follow up with them August 1st. What is she taking now,

## 2017-12-05 ENCOUNTER — Other Ambulatory Visit: Payer: Self-pay

## 2017-12-05 MED ORDER — OLMESARTAN MEDOXOMIL 40 MG PO TABS: 40.0000 mg | ORAL_TABLET | Freq: Every day | ORAL | 0 refills | Status: DC

## 2017-12-05 NOTE — Telephone Encounter (Signed)
Patient called back this morning to say that she called to [facility] to check prices on Olmesartan and they have it for $22.79.  This is down from $83 at [facility] that she was paying.  She can afford this at [facility] and is requesting that we send Rx to [facility] for her.    Advised Dr. Renold Genta of this information.

## 2017-12-06 ENCOUNTER — Ambulatory Visit (INDEPENDENT_AMBULATORY_CARE_PROVIDER_SITE_OTHER): Payer: Managed Care, Other (non HMO) | Admitting: Family Medicine

## 2017-12-06 ENCOUNTER — Encounter (INDEPENDENT_AMBULATORY_CARE_PROVIDER_SITE_OTHER): Payer: Self-pay | Admitting: Family Medicine

## 2017-12-06 VITALS — BP 144/84 | HR 81 | Temp 98.3°F | Ht 64.0 in | Wt 286.0 lb

## 2017-12-06 DIAGNOSIS — E119 Type 2 diabetes mellitus without complications: Secondary | ICD-10-CM

## 2017-12-06 DIAGNOSIS — I10 Essential (primary) hypertension: Secondary | ICD-10-CM

## 2017-12-06 DIAGNOSIS — R5383 Other fatigue: Secondary | ICD-10-CM

## 2017-12-06 DIAGNOSIS — Z6841 Body Mass Index (BMI) 40.0 and over, adult: Secondary | ICD-10-CM

## 2017-12-06 DIAGNOSIS — Z9189 Other specified personal risk factors, not elsewhere classified: Secondary | ICD-10-CM

## 2017-12-06 DIAGNOSIS — Z0289 Encounter for other administrative examinations: Secondary | ICD-10-CM

## 2017-12-06 DIAGNOSIS — R0602 Shortness of breath: Secondary | ICD-10-CM

## 2017-12-06 DIAGNOSIS — E7849 Other hyperlipidemia: Secondary | ICD-10-CM

## 2017-12-06 DIAGNOSIS — F3289 Other specified depressive episodes: Secondary | ICD-10-CM

## 2017-12-06 MED ORDER — BLOOD GLUCOSE MONITOR KIT: PACK | 0 refills | Status: DC

## 2017-12-07 LAB — COMPREHENSIVE METABOLIC PANEL
ALBUMIN: 4.4 g/dL (ref 3.5–5.5)
ALT: 17 IU/L (ref 0–32)
AST: 13 IU/L (ref 0–40)
Albumin/Globulin Ratio: 1.8 (ref 1.2–2.2)
Alkaline Phosphatase: 83 IU/L (ref 39–117)
BUN / CREAT RATIO: 20 (ref 9–23)
BUN: 11 mg/dL (ref 6–24)
Bilirubin Total: 0.7 mg/dL (ref 0.0–1.2)
CHLORIDE: 100 mmol/L (ref 96–106)
CO2: 19 mmol/L — AB (ref 20–29)
CREATININE: 0.56 mg/dL — AB (ref 0.57–1.00)
Calcium: 9.6 mg/dL (ref 8.7–10.2)
GFR calc non Af Amer: 116 mL/min/{1.73_m2} (ref >59)
GFR, EST AFRICAN AMERICAN: 134 mL/min/{1.73_m2} (ref >59)
GLUCOSE: 116 mg/dL — AB (ref 65–99)
Globulin, Total: 2.4 g/dL (ref 1.5–4.5)
Potassium: 4.1 mmol/L (ref 3.5–5.2)
Sodium: 139 mmol/L (ref 134–144)
TOTAL PROTEIN: 6.8 g/dL (ref 6.0–8.5)

## 2017-12-07 LAB — CBC WITH DIFFERENTIAL
BASOS ABS: 0.1 10*3/uL (ref 0.0–0.2)
Basos: 1 %
EOS (ABSOLUTE): 0.3 10*3/uL (ref 0.0–0.4)
Eos: 2 %
HEMOGLOBIN: 13.1 g/dL (ref 11.1–15.9)
Hematocrit: 38.9 % (ref 34.0–46.6)
IMMATURE GRANS (ABS): 0 10*3/uL (ref 0.0–0.1)
IMMATURE GRANULOCYTES: 0 %
LYMPHS: 39 %
Lymphocytes Absolute: 4.4 10*3/uL — ABNORMAL HIGH (ref 0.7–3.1)
MCH: 30 pg (ref 26.6–33.0)
MCHC: 33.7 g/dL (ref 31.5–35.7)
MCV: 89 fL (ref 79–97)
MONOCYTES: 6 %
Monocytes Absolute: 0.6 10*3/uL (ref 0.1–0.9)
NEUTROS PCT: 52 %
Neutrophils Absolute: 5.8 10*3/uL (ref 1.4–7.0)
RBC: 4.36 x10E6/uL (ref 3.77–5.28)
RDW: 13.7 % (ref 12.3–15.4)
WBC: 11.2 10*3/uL — AB (ref 3.4–10.8)

## 2017-12-07 LAB — INSULIN, RANDOM: INSULIN: 35.9 u[IU]/mL — ABNORMAL HIGH (ref 2.6–24.9)

## 2017-12-07 LAB — MICROALBUMIN / CREATININE URINE RATIO
Creatinine, Urine: 97 mg/dL
MICROALBUM., U, RANDOM: 104 ug/mL
Microalb/Creat Ratio: 107.2 mg/g creat — ABNORMAL HIGH (ref 0.0–30.0)

## 2017-12-07 LAB — LIPID PANEL WITH LDL/HDL RATIO
CHOLESTEROL TOTAL: 179 mg/dL (ref 100–199)
HDL: 51 mg/dL (ref >39)
LDL Calculated: 100 mg/dL — ABNORMAL HIGH (ref 0–99)
LDl/HDL Ratio: 2 ratio (ref 0.0–3.2)
Triglycerides: 138 mg/dL (ref 0–149)
VLDL CHOLESTEROL CAL: 28 mg/dL (ref 5–40)

## 2017-12-07 LAB — HEMOGLOBIN A1C
ESTIMATED AVERAGE GLUCOSE: 140 mg/dL
Hgb A1c MFr Bld: 6.5 % — ABNORMAL HIGH (ref 4.8–5.6)

## 2017-12-07 LAB — T3: T3 TOTAL: 155 ng/dL (ref 71–180)

## 2017-12-07 LAB — FOLATE

## 2017-12-07 LAB — T4, FREE: FREE T4: 1.03 ng/dL (ref 0.82–1.77)

## 2017-12-07 LAB — VITAMIN B12: VITAMIN B 12: 420 pg/mL (ref 232–1245)

## 2017-12-07 LAB — VITAMIN D 25 HYDROXY (VIT D DEFICIENCY, FRACTURES): VIT D 25 HYDROXY: 27.7 ng/mL — AB (ref 30.0–100.0)

## 2017-12-07 LAB — TSH: TSH: 1.48 u[IU]/mL (ref 0.450–4.500)

## 2017-12-10 NOTE — Progress Notes (Addendum)
Office: [phone]  /  Fax: [phone] Date: December 20, 2017 Time Seen: 3:35pm Duration: 70 minutes Provider: Glennie Isle, PsyD Type of Session: Intake for Individual Therapy   Informed Consent:The provider's role was explained to Crystal Wallace Wallace. The provider discussed issues of confidentiality, privacy, and limits therein; anticipated course of treatment; potential risks and benefits involved with psychotherapy; the voluntary nature of treatment; and the clinic's cancellation policy. The provider also discussed billing, as it relates to insurance and the patient's responsibility. In addition to written consent, verbal informed consent for psychological services was obtained from Crystal Wallace Wallace prior to the initial intake interview.   Crystal Wallace Wallace was informed that information about mental health appointments will be entered in the medical record at [facility]) via Epic. Moreover, Crystal Wallace Wallace agreed information may be Wallace with other [facility] providers as needed for coordination of care. Written consent was also provided for this provider to coordinate care with other providers at [facility].The provider further explained leaving voicemail messages and sending messages via MyChart can be utilized for non-emergency reasons, and limits of confidentiality related to communication via technology was discussed. Furthermore, Crystal Wallace Wallace was informed the clinic is not a 24/7 crisis center and mental health emergency resources were Wallace. Crystal Wallace Wallace was given a handout with emergency resources. Crystal Wallace Wallace verbally acknowledged understanding, and agreed to use mental health emergency resources discussed if needed.   Chief Complaint: Crystal Wallace Wallace was referred by Crystal Wallace Wallace. Per the note for the initial visit with Crystal Wallace Wallace on December 06, 2017, Crystal Wallace Wallace reported experiencing the following: picky eater and doesn't like to eat healthier foods; skips meals  frequently; frequently drinks liquids with calories; frequently makes poor food choices; frequently eats larger portions than normal; and struggles with emotional eating. During today's appointment, Crystal Wallace Wallace Wallace, "I don't think I am an emotional eater." She noted, "I eat when I am hungry." Since starting with the clinic, Crystal Wallace Wallace explained she has not been as hungry as she used to be. Moreover, she indicated during her initial appointment with Crystal Wallace Wallace, they discussed that Crystal Wallace Wallace believes she is not an emotional eater, but there is still a possibility she may be. She Wallace she craves salty and crunchy foods (e.g., potato chips). Crystal Wallace Wallace explained she would crave the aforementioned when menstruating. Furthermore, she Wallace she would eat certain things out of habit. She expressed worry about her ability to continue being successful with the meal plan. More specifically, she discussed birthdays earlier this year caused her to be unsuccessful with the healthy changes she was trying to implement. Additionally, Crystal Wallace Wallace described engaging in "all or nothing" thinking as she decribed various foods (e.g., cake) she cannot have at all as they are not noted on her current meal plan.   Crystal Wallace Wallace was asked to complete a questionnaire assessing various behaviors related to emotional eating. Crystal Wallace Wallace endorsed the following: overeat when you are celebrating.   HPI: Per the note for the initial visit with Crystal Wallace Wallace on December 06, 2017, Crystal Wallace Wallace, and does not know when she started gaining weight. Crystal Wallace Wallace heaviest weight ever was 290 pounds. She denied binging, purging, and other compensatory strategies. Crystal Wallace Wallace denied ever being diagnosed with an eating disorder. Previously, Crystal Wallace Wallace she tried to diet "a whole lot of times." She described "doing good" for a period of time, but then she would begin eating whatever she wanted to.   Mental Status Examination: Crystal Wallace Wallace arrived early  for the  appointment; therefore, the appointment was initiated early. She presented as appropriately dressed and groomed. Crystal Wallace Wallace appeared her stated age and demonstrated adequate orientation to time, place, person, and purpose of the appointment. She also demonstrated appropriate eye contact. No psychomotor abnormalities or behavioral peculiarities noted. Her mood was euthymic with congruent affect. Her thought processes were logical, linear, and goal-directed. No hallucinations, delusions, bizarre thinking or behavior reported or observed. Judgment, insight, and impulse control appeared to be grossly intact. There was no evidence of paraphasias (i.e., errors in speech, gross mispronunciations, and word substitutions), repetition deficits, or disturbances in volume or prosody (i.e., rhythm and intonation). There was no evidence of attention or memory impairments. Crystal Wallace Wallace denied current suicidal and homicidal ideation, plan, and intent.   The Mini-Mental State Examination, Second Edition (MMSE-2) was administered. The MMSE-2 briefly screens for cognitive dysfunction and overall mental status and assesses different cognitive domains: orientation, registration, attention and calculation, recall, and language and praxis. Crystal Wallace Wallace received 29 out of 30 points possible on the MMSE-2, which is noted in the normal range. A point was lost on the attention and calculation task as a calculation error was made.   Family & Psychosocial History: Crystal Wallace Wallace reported she is single, and has never been married. She noted, "I was engaged once." Crystal Wallace Wallace she has one daughter (age 58). 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 She described work as being stressful. [name] Wallace her highest level of education is "some college." She noted her social support system consists of her family, best friend, and a couple of  coworkers.   Medical History:  Past Medical History:  Diagnosis Date  . Diabetes ([place])   . HTN (hypertension)   . Hx of blood clots   . Hyperlipidemia   . Lactose intolerance   . Vitamin D deficiency    Past Surgical History:  Procedure Laterality Date  .  c section  1999  . adenoid removal age 67    . cervical 5 and cervical 6 neck surgery  12-07-2010  . ROBOT ASSISTED MYOMECTOMY  10/20/2011   Procedure: ROBOTIC ASSISTED MYOMECTOMY;  Surgeon: Lahoma Crocker, MD;  Location: [facility];  Service: Gynecology;  Laterality: N/A;   Current Outpatient Medications on File Prior to Visit  Medication Sig Dispense Refill  . amLODipine (NORVASC) 5 MG tablet One po daily 90 tablet 1  . blood glucose meter kit and supplies KIT Dispense based on patient and insurance preference. Use up to four times daily as directed. (FOR ICD-9 250.00, 250.01). 1 each 0  . carvedilol (COREG) 12.5 MG tablet TAKE 1 TABLET(12.5 MG) BY MOUTH TWICE DAILY WITH A MEAL 60 tablet 3  . cholecalciferol (VITAMIN D) 1000 UNITS tablet Take 1,000 Units by mouth daily.    . cyclobenzaprine (FLEXERIL) 10 MG tablet TAKE 1 TABLET(10 MG) BY MOUTH AT BEDTIME 30 tablet 0  . furosemide (LASIX) 40 MG tablet TAKE 1 TABLET(40 MG) BY MOUTH DAILY 90 tablet 0  . glucose blood test strip Use as instructed 100 each 11  . glucose monitoring kit (FREESTYLE) monitoring kit 1 each by Does not apply route as needed for other. 1 each 0  . levonorgestrel (MIRENA) 20 MCG/24HR IUD 1 each by Intrauterine route once.    . metFORMIN (GLUCOPHAGE-XR) 750 MG 24 hr tablet TAKE 1 TABLET(750 MG) BY MOUTH DAILY WITH BREAKFAST 30 tablet 5  . Multiple Vitamin (MULTIVITAMIN WITH  MINERALS) TABS tablet Take 1 tablet by mouth daily.    Marland Kitchen olmesartan (BENICAR) 40 MG tablet Take 1 tablet (40 mg total) by mouth daily. 90 tablet 0  . Omega-3 Fatty Acids (FISH OIL) 875 MG CHEW Chew 2 each by mouth daily.    . potassium chloride SA (K-DUR,KLOR-CON) 20 MEQ tablet TAKE 1  TABLET(20 MEQ) BY MOUTH DAILY 30 tablet 0  . simvastatin (ZOCOR) 40 MG tablet Take 1 tablet (40 mg total) by mouth at bedtime. 90 tablet 3  . Vitamin D, Ergocalciferol, (DRISDOL) 50000 units CAPS capsule Take 1 capsule (50,000 Units total) by mouth every 7 (seven) days. 4 capsule 0   No current facility-administered medications on file prior to visit.   Carle denied a history of head injuries and loss of consciousness.   Mental Health History:  Harlym reported she has never received therapeutic services nor has she been hospitalized for psychiatric reasons. She has never seen a psychiatrist nor has she ever been prescribed psychotropics. Kinsey reported her maternal grandmother and paternal great grandmother were diagnosed with dementia. She denied a trauma history, including sexual, physical, and psychological abuse, as well as neglect. Hiyab reported experiencing the following: irritability and worry thoughts about her health, work, and continued success with the clinic. She denied the following: anhedonia; sleep issues; obsessions and compulsions; mania; hallucinations and delusions; attention and concentration issues; substance use; engagement in self-harm; and history of and current suicidal and homicidal ideation, plan, and intent.   Structured Assessment Results: The Patient Health Questionnaire-9 (PHQ-9) is a self-report measure that assesses symptoms and severity of depression over the course of the last two weeks. Coley obtained a score of one suggesting minimal depression. Natali finds the endorsed symptoms to be not difficult at all. Depression screen [facility] 2/9 12/20/2017  Decreased Interest 0  Down, Depressed, Hopeless 0  PHQ - 2 Score 0  Altered sleeping 1  Tired, decreased energy 0  Change in appetite 0  Feeling bad or failure about yourself  0  Trouble concentrating 0  Moving slowly or fidgety/restless 0  Suicidal thoughts 0  PHQ-9 Score 1  Difficult doing work/chores -    The Generalized Anxiety Disorder-7 (GAD-7) is a brief self-report measure that assesses symptoms of anxiety over the course of the last two weeks. Ree obtained a score of one suggesting minimal anxiety. GAD 7 : Generalized Anxiety Score 12/20/2017  Nervous, Anxious, on Edge 0  Control/stop worrying 0  Worry too much - different things 0  Trouble relaxing 0  Restless 0  Easily annoyed or irritable 1  Afraid - awful might happen 0  Total GAD 7 Score 1  Anxiety Difficulty Not difficult at all   Interventions: A chart review was conducted prior to the clinical intake interview. The MMSE-2, PHQ-9, and GAD-7 were administered and a clinical intake interview was completed. In addition, Genie was asked to complete a Mood and Food questionnaire to assess various behaviors related to emotional eating. Throughout session, empathic reflections and validation was provided. Continuing treatment with this provider was discussed and a treatment goal was established. Psychoeducation regarding emotional versus physical hunger was provided. Kashlyn was given a handout to utilize between now and the next appointment to increase awareness of hunger patterns and subsequent eating. Psychoeducation regarding triggers for emotional eating was also provided. Andraya was provided a handout, and encouraged to utilize the handout between now and the next appointment to increase awareness of triggers and frequency. Frimet agreed. Following the psychoeducation, mindless  eating and guilt were identified as they relate to emotional hunger. The following triggers were identified: out of habit and fatigue. However, Ridhima clarified emotional eating has not occurred since starting with the clinic.   Provisional DSM-5 Diagnosis: 300.09 (F41.8) Other Specified Anxiety Disorder, Generalized anxiety not occurring more days than not and Emotional eating  Plan: Gabriana expressed understanding and agreement with the initial  treatment plan of care. She appears able and willing to participate as evidenced by collaboration on a treatment goal, engagement in reciprocal conversation, and asking questions as needed for clarification. The next appointment will be scheduled in one month based on her current symptomatology and her desire to see how she does as she continues on the meal plan. The following treatment goal was established: decrease symptoms of anxiety. Ardis expressed willingness to continue monitoring her hunger patterns, and subsequent eating.

## 2017-12-11 NOTE — Progress Notes (Signed)
Office: [phone]  /  Fax: [phone]   Dear Dr. Renold Genta,   Thank you for referring Crystal Wallace to our clinic. The following note includes my evaluation and treatment recommendations.  HPI:   Chief Complaint: OBESITY    Crystal Wallace has been referred by Cresenciano Lick. Renold Genta, MD for consultation regarding her obesity and obesity related comorbidities.    Crystal Wallace (MR# [id number]) is a 42 y.o. female who presents on 12/06/2017 for obesity evaluation and treatment. Current BMI is Body mass index is 49.09 kg/m.Crystal Wallace Kitchen Crystal Wallace has been struggling with her weight for many years and has been unsuccessful in either losing weight, maintaining weight loss, or reaching her healthy weight goal.     Crystal Wallace attended our information session and states she is currently in the action stage of change and ready to dedicate time achieving and maintaining a healthier weight. Crystal Wallace is interested in becoming our patient and working on intensive lifestyle modifications including (but not limited to) diet, exercise and weight loss.    Crystal Wallace states her family eats meals together her desired weight loss is 106-126 lbs she has been heavy most of  her life she started gaining weight (don't know) her heaviest weight ever was 290 lbs she is a picky eater and doesn't like to eat healthier foods  she skips meals frequently she is frequently drinking liquids with calories she frequently makes poor food choices she frequently eats larger portions than normal  she struggles with emotional eating    Fatigue Crystal Wallace feels her energy is lower than it should be. This has worsened with weight gain and has not worsened recently. Tiahna admits to daytime somnolence and  denies waking up still tired. Patient is at risk for obstructive sleep apnea. Patent has a history of symptoms of daytime fatigue. Patient generally gets 5 hours of sleep per night, and states they generally have generally restful sleep. Snoring is  present. Apneic episodes are not present. Epworth Sleepiness Score is 3.  Dyspnea on exertion Crystal Wallace notes increasing shortness of breath with exercising and seems to be worsening over time with weight gain. She notes getting out of breath sooner with activity than she used to. This has not gotten worse recently. Crystal Wallace denies orthopnea.  Diabetes II Crystal Wallace has a diagnosis of diabetes type II. Crystal Wallace states she is not checking BGs. No recent labs, she notes polyphagia and  denies any hypoglycemic episodes.   Hypertension Crystal Wallace is a 42 y.o. female with hypertension. Crystal Wallace blood pressure is elevated today, she take her medications. She denies chest pain or headache. She is working weight loss to help control her blood pressure with the goal of decreasing her risk of heart attack and stroke. Jamilla's blood pressure is not currently controlled.  Hyperlipidemia Crystal Wallace has hyperlipidemia. She is on simvastatin, wants to try to improve her cholesterol levels with intensive lifestyle modification including a low saturated fat diet, exercise and weight loss. She denies any chest pain, claudication or myalgias.  At risk for cardiovascular disease Crystal Wallace is at a higher than average risk for cardiovascular disease due to obesity, diabetes II, hypertension, and hyperlipidemia. She currently denies any chest pain.  Depression with emotional eating behaviors Crystal Wallace PHQ-9 is abnormally low, she seems to have decreased insight on her emotional eating which is more aparent when she talks about food and carbohydrate cravings, and grazing eating behaviors. Crystal Wallace struggles with emotional eating and using food for comfort to the extent that it is negatively impacting  her health. She often snacks when she is not hungry. Crystal Wallace sometimes feels she is out of control and then feels guilty that she made poor food choices. She has been working on behavior modification techniques to help reduce her  emotional eating and has been somewhat successful. She shows no sign of suicidal or homicidal ideations.  Depression screen Crystal Wallace 12/06/2017 07/13/2017  Decreased Interest 0 0  Down, Depressed, Hopeless 1 0  PHQ - 2 Score 1 0  Altered sleeping 0 -  Tired, decreased energy 0 -  Change in appetite 0 -  Feeling bad or failure about yourself  0 -  Trouble concentrating 0 -  Moving slowly or fidgety/restless 0 -  Suicidal thoughts 0 -  PHQ-9 Score 1 -  Difficult doing work/chores Not difficult at all -   Depression Screen Crystal Wallace Food and Mood (modified PHQ-9) score was  Depression screen PHQ Wallace 12/06/2017  Decreased Interest 0  Down, Depressed, Hopeless 1  PHQ - 2 Score 1  Altered sleeping 0  Tired, decreased energy 0  Change in appetite 0  Feeling bad or failure about yourself  0  Trouble concentrating 0  Moving slowly or fidgety/restless 0  Suicidal thoughts 0  PHQ-9 Score 1  Difficult doing work/chores Not difficult at all    ALLERGIES: No Known Allergies  MEDICATIONS: Current Outpatient Medications on File Prior to Visit  Medication Sig Dispense Refill  . amLODipine (NORVASC) 5 MG tablet One po daily 90 tablet 1  . carvedilol (COREG) 12.5 MG tablet TAKE 1 TABLET(12.5 MG) BY MOUTH TWICE DAILY WITH A MEAL 60 tablet 3  . cholecalciferol (VITAMIN D) 1000 UNITS tablet Take 1,000 Units by mouth daily.    . cyclobenzaprine (FLEXERIL) 10 MG tablet TAKE 1 TABLET(10 MG) BY MOUTH AT BEDTIME 30 tablet 0  . furosemide (LASIX) 40 MG tablet TAKE 1 TABLET(40 MG) BY MOUTH DAILY 90 tablet 0  . glucose blood test strip Use as instructed 100 each 11  . glucose monitoring kit (FREESTYLE) monitoring kit 1 each by Does not apply route as needed for other. 1 each 0  . levonorgestrel (MIRENA) 20 MCG/24HR IUD 1 each by Intrauterine route once.    . metFORMIN (GLUCOPHAGE-XR) 750 MG 24 hr tablet TAKE 1 TABLET(750 MG) BY MOUTH DAILY WITH BREAKFAST 30 tablet 5  . Multiple Vitamin (MULTIVITAMIN WITH  MINERALS) TABS tablet Take 1 tablet by mouth daily.    Crystal Wallace Kitchen olmesartan (BENICAR) 40 MG tablet Take 1 tablet (40 mg total) by mouth daily. 90 tablet 0  . Omega-3 Fatty Acids (FISH OIL) 875 MG CHEW Chew 2 each by mouth daily.    . potassium chloride SA (K-DUR,KLOR-CON) 20 MEQ tablet TAKE 1 TABLET(20 MEQ) BY MOUTH DAILY 30 tablet 0  . simvastatin (ZOCOR) 40 MG tablet Take 1 tablet (40 mg total) by mouth at bedtime. 90 tablet 3   No current facility-administered medications on file prior to visit.     PAST MEDICAL HISTORY: Past Medical History:  Diagnosis Date  . Diabetes ([place])   . HTN (hypertension)   . Hx of blood clots   . Hyperlipidemia   . Lactose intolerance   . Vitamin D deficiency     PAST SURGICAL HISTORY: Past Surgical History:  Procedure Laterality Date  .  c section  1999  . adenoid removal age 67    . cervical 5 and cervical 6 neck surgery  12-07-2010  . ROBOT ASSISTED MYOMECTOMY  10/20/2011   Procedure: ROBOTIC ASSISTED  MYOMECTOMY;  Surgeon: Lahoma Crocker, MD;  Location: [facility];  Service: Gynecology;  Laterality: N/A;    SOCIAL HISTORY: Social History   Tobacco Use  . Smoking status: Never Smoker  . Smokeless tobacco: Never Used  Substance Use Topics  . Alcohol use: No  . Drug use: No    FAMILY HISTORY: Family History  Problem Relation Age of Onset  . Arthritis Mother   . Heart disease Mother   . Hyperlipidemia Mother   . Hypertension Father   . Diabetes Father   . Obesity Father     ROS: Review of Systems  Constitutional: Positive for malaise/fatigue. Negative for weight loss.  Eyes:       + Wear glasses or contacts  Cardiovascular: Negative for chest pain, orthopnea and claudication.  Musculoskeletal: Negative for myalgias.       + Muscle or joint pain  Neurological: Negative for headaches.  Endo/Heme/Allergies:       Positive polyphagia Negative hypoglycemia  Psychiatric/Behavioral: Positive for depression. Negative for suicidal ideas.     PHYSICAL EXAM: Blood pressure (!) 144/84, pulse 81, temperature 98.3 F (36.8 C), temperature source Oral, height '5\' 4"'$  (1.626 m), weight 286 lb (129.7 kg), last menstrual period 11/07/2017, SpO2 95 %. Body mass index is 49.09 kg/m. Physical Exam  Constitutional: She is oriented to person, place, and time. She appears well-developed and well-nourished.  HENT:  Head: Normocephalic and atraumatic.  Nose: Nose normal.  Eyes: EOM are normal. No scleral icterus.  Neck: Normal range of motion. Neck supple. No thyromegaly present.  Cardiovascular: Normal rate and regular rhythm.  Pulmonary/Chest: Effort normal. No respiratory distress.  Abdominal: Soft. There is no tenderness.  + Obesity  Musculoskeletal:  Range of Motion normal in all 4 extremities Trace edema noted in bilateral lower extremities  Neurological: She is alert and oriented to person, place, and time. Coordination normal.  Skin: Skin is warm and dry.  Psychiatric: She has a normal mood and affect. Her behavior is normal.  Vitals reviewed.   RECENT LABS AND TESTS: BMET    Component Value Date/Time   NA 139 12/06/2017 1036   K 4.1 12/06/2017 1036   CL 100 12/06/2017 1036   CO2 19 (L) 12/06/2017 1036   GLUCOSE 116 (H) 12/06/2017 1036   GLUCOSE 114 (H) 07/10/2017 0908   BUN 11 12/06/2017 1036   CREATININE 0.56 (L) 12/06/2017 1036   CREATININE 0.65 07/10/2017 0908   CALCIUM 9.6 12/06/2017 1036   GFRNONAA 116 12/06/2017 1036   GFRNONAA 110 07/10/2017 0908   GFRAA 134 12/06/2017 1036   GFRAA 128 07/10/2017 0908   Lab Results  Component Value Date   HGBA1C 6.5 (H) 12/06/2017   Lab Results  Component Value Date   INSULIN 35.9 (H) 12/06/2017   CBC    Component Value Date/Time   WBC 11.2 (H) 12/06/2017 1036   WBC 13.1 (H) 07/10/2017 0908   RBC 4.36 12/06/2017 1036   RBC 4.52 07/10/2017 0908   HGB 13.1 12/06/2017 1036   HCT 38.9 12/06/2017 1036   PLT 392 07/10/2017 0908   MCV 89 12/06/2017 1036   MCH  30.0 12/06/2017 1036   MCH 29.4 07/10/2017 0908   MCHC 33.7 12/06/2017 1036   MCHC 34.4 07/10/2017 0908   RDW 13.7 12/06/2017 1036   LYMPHSABS 4.4 (H) 12/06/2017 1036   MONOABS 0.5 04/16/2015 0901   EOSABS 0.3 12/06/2017 1036   BASOSABS 0.1 12/06/2017 1036   Iron/TIBC/Ferritin/ %Sat    Component Value Date/Time  IRON 112 04/16/2015 0901   TIBC 303 04/16/2015 0901   IRONPCTSAT 37 04/16/2015 0901   Lipid Panel     Component Value Date/Time   CHOL 179 12/06/2017 1036   TRIG 138 12/06/2017 1036   HDL 51 12/06/2017 1036   CHOLHDL 3.8 08/07/2017 0933   VLDL 28 05/19/2016 1128   LDLCALC 100 (H) 12/06/2017 1036   LDLCALC 104 (H) 08/07/2017 0933   Hepatic Function Panel     Component Value Date/Time   PROT 6.8 12/06/2017 1036   ALBUMIN 4.4 12/06/2017 1036   AST 13 12/06/2017 1036   ALT 17 12/06/2017 1036   ALKPHOS 83 12/06/2017 1036   BILITOT 0.7 12/06/2017 1036   BILIDIR 0.2 08/07/2017 0933   IBILI 0.9 08/07/2017 0933      Component Value Date/Time   TSH 1.480 12/06/2017 1036   TSH 1.63 07/10/2017 0908   TSH 0.986 04/16/2015 0901    ECG  shows NSR with a rate of BPM unknown, per Buford Dresser, MD EKG-normal sinus rhythm.  INDIRECT CALORIMETER done today shows a VO2 of 316 and a REE of 2202.  Her calculated basal metabolic rate is 2831 thus her basal metabolic rate is better than expected.    ASSESSMENT AND PLAN: Other fatigue - Plan: Vitamin B12, CBC With Differential, Folate, T3, T4, free, TSH, VITAMIN D 25 Hydroxy (Vit-D Deficiency, Fractures)  Shortness of breath on exertion - Plan: CBC With Differential  Type 2 diabetes mellitus without complication, without long-term current use of insulin (HCC) - Plan: Comprehensive metabolic panel, Hemoglobin A1c, Insulin, random, Microalbumin / creatinine urine ratio, blood glucose meter kit and supplies KIT  Essential hypertension - Plan: Comprehensive metabolic panel  Other hyperlipidemia - Plan: Lipid Panel  With LDL/HDL Ratio  Other depression - with emotional eating  At risk for heart disease  Class 3 severe obesity with serious comorbidity and body mass index (BMI) of 45.0 to 49.9 in adult, unspecified obesity type (HCC)  PLAN:  Fatigue Wai was informed that her fatigue may be related to obesity, depression or many other causes. Labs will be ordered, and in the meanwhile Deliliah has agreed to work on diet, exercise and weight loss to help with fatigue. Proper sleep hygiene was discussed including the need for 7-8 hours of quality sleep each night. A sleep study was not ordered based on symptoms and Epworth score.  Dyspnea on exertion Klarissa's shortness of breath appears to be obesity related and exercise induced. She has agreed to work on weight loss and gradually increase exercise to treat her exercise induced shortness of breath. If Shamariah follows our instructions and loses weight without improvement of her shortness of breath, we will plan to refer to pulmonology. We will monitor this condition regularly. Kalen agrees to this plan.  Diabetes II Zykeriah has been given extensive diabetes education by myself today including ideal fasting and post-prandial blood glucose readings, individual ideal Hgb A1c goals and hypoglycemia prevention. We discussed the importance of good blood sugar control to decrease the likelihood of diabetic complications such as nephropathy, neuropathy, limb loss, blindness, coronary artery disease, and death. We discussed the importance of intensive lifestyle modification including diet, exercise and weight loss as the first line treatment for diabetes. We will send glucometer, test strips and lancets to the pharmacy. We will check labs and Alitzel agrees to follow up with our clinic in 2 weeks with myself and Dr. Mallie Mussel, our bariatric psychologist.  Hypertension We discussed sodium restriction, working on healthy weight  loss, and a regular exercise program as the  means to achieve improved blood pressure control. Apryll agreed with this plan and agreed to follow up as directed. We will continue to monitor her blood pressure as well as her progress with the above lifestyle modifications. She will start diet and continue her medications, and will watch for signs of hypotension as she continues her lifestyle modifications. We will check labs and Elfreda agrees to follow up with our clinic in 2 weeks with myself and Dr. Mallie Mussel, our bariatric psychologist.  Hyperlipidemia Laquinta was informed of the American Heart Association Guidelines emphasizing intensive lifestyle modifications as the first line treatment for hyperlipidemia. We discussed many lifestyle modifications today in depth, and Alessandria will continue to work on decreasing saturated fats such as fatty red meat, butter and many fried foods. She will continue her medications and start diet, and will also increase vegetables and lean protein in her diet and continue to work on exercise and weight loss efforts. We will check labs and Breon agrees to follow up with our clinic in 2 weeks with myself and Dr. Mallie Mussel, our bariatric psychologist.  Cardiovascular risk counselling Dorisann was given extended (15 minutes) coronary artery disease prevention counseling today. She is 42 y.o. female and has risk factors for heart disease including obesity, diabetes II, hypertension, and hyperlipidemia. We discussed intensive lifestyle modifications today with an emphasis on specific weight loss instructions and strategies. Pt was also informed of the importance of increasing exercise and decreasing saturated fats to help prevent heart disease.  Depression with Emotional Eating Behaviors We discussed behavior modification techniques today to help Pollyann deal with her emotional eating and depression. We will refer to Dr. Mallie Mussel for evaluation. Kazi agrees to follow up with our clinic in 2 weeks with myself and Dr. Mallie Mussel,  our bariatric psychologist.  Depression Screen Crystal Wallace had a negative depression screening. Depression is commonly associated with obesity and often results in emotional eating behaviors. We will monitor this closely and work on CBT to help improve the non-hunger eating patterns. Referral to Psychology may be required if no improvement is seen as she continues in our clinic.  Obesity Reida is currently in the action stage of change and her goal is to continue with weight loss efforts. I recommend Keyosha begin the structured treatment plan as follows:  She has agreed to follow the Category 3 plan Samanthajo has been instructed to eventually work up to a goal of 150 minutes of combined cardio and strengthening exercise per week for weight loss and overall health benefits. We discussed the following Behavioral Modification Strategies today: increasing lean protein intake, decreasing simple carbohydrates  and work on meal planning and easy cooking plans   She was informed of the importance of frequent follow up visits to maximize her success with intensive lifestyle modifications for her multiple health conditions. She was informed we would discuss her lab results at her next visit unless there is a critical issue that needs to be addressed sooner. Stepheny agreed to keep her next visit at the agreed upon time to discuss these results.    OBESITY BEHAVIORAL INTERVENTION VISIT  Today's visit was # 1 out of 22.  Starting weight: 286 lbs Starting date: 12/06/17 Today's weight : 286 lbs  Today's date: 12/06/2017 Total lbs lost to date: 0 (Patients must lose 7 lbs in the first 6 months to continue with counseling)   ASK: We discussed the diagnosis of obesity with [name] Gave today and Crystal Wallace  agreed to give [name] permission to discuss obesity behavioral modification therapy today.  ASSESS: Catherin has the diagnosis of obesity and her BMI today is 49.07 Wilhemina is in the action stage of change    ADVISE: Anesa was educated on the multiple health risks of obesity as well as the benefit of weight loss to improve her health. She was advised of the need for long term treatment and the importance of lifestyle modifications.  AGREE: Multiple dietary modification options and treatment options were discussed and  Wiley agreed to the above obesity treatment plan.   I, Trixie Dredge, am acting as transcriptionist for Dennard Nip, MD   I have reviewed the above documentation for accuracy and completeness, and I agree with the above. -Dennard Nip, MD

## 2017-12-12 ENCOUNTER — Other Ambulatory Visit (HOSPITAL_COMMUNITY): Payer: Self-pay | Admitting: Obstetrics and Gynecology

## 2017-12-12 DIAGNOSIS — T8332XA Displacement of intrauterine contraceptive device, initial encounter: Secondary | ICD-10-CM

## 2017-12-14 ENCOUNTER — Ambulatory Visit (HOSPITAL_COMMUNITY)
Admission: RE | Admit: 2017-12-14 | Discharge: 2017-12-14 | Disposition: A | Discharge status: Home / Self Care | Payer: Managed Care, Other (non HMO) | Source: Ambulatory Visit | Attending: Obstetrics and Gynecology | Admitting: Obstetrics and Gynecology

## 2017-12-14 DIAGNOSIS — T8332XA Displacement of intrauterine contraceptive device, initial encounter: Secondary | ICD-10-CM

## 2017-12-14 DIAGNOSIS — X58XXXA Exposure to other specified factors, initial encounter: Secondary | ICD-10-CM | POA: Diagnosis not present

## 2017-12-20 ENCOUNTER — Ambulatory Visit (INDEPENDENT_AMBULATORY_CARE_PROVIDER_SITE_OTHER): Payer: Self-pay | Admitting: Psychology

## 2017-12-20 ENCOUNTER — Ambulatory Visit (INDEPENDENT_AMBULATORY_CARE_PROVIDER_SITE_OTHER): Payer: Managed Care, Other (non HMO) | Admitting: Family Medicine

## 2017-12-20 VITALS — BP 116/80 | HR 84 | Temp 98.4°F | Ht 64.0 in | Wt 279.0 lb

## 2017-12-20 DIAGNOSIS — E1129 Type 2 diabetes mellitus with other diabetic kidney complication: Secondary | ICD-10-CM | POA: Diagnosis not present

## 2017-12-20 DIAGNOSIS — E7849 Other hyperlipidemia: Secondary | ICD-10-CM

## 2017-12-20 DIAGNOSIS — E559 Vitamin D deficiency, unspecified: Secondary | ICD-10-CM | POA: Diagnosis not present

## 2017-12-20 DIAGNOSIS — I10 Essential (primary) hypertension: Secondary | ICD-10-CM

## 2017-12-20 DIAGNOSIS — Z6841 Body Mass Index (BMI) 40.0 and over, adult: Secondary | ICD-10-CM | POA: Diagnosis not present

## 2017-12-20 DIAGNOSIS — Z9189 Other specified personal risk factors, not elsewhere classified: Secondary | ICD-10-CM

## 2017-12-20 DIAGNOSIS — R809 Proteinuria, unspecified: Secondary | ICD-10-CM

## 2017-12-20 DIAGNOSIS — F418 Other specified anxiety disorders: Secondary | ICD-10-CM

## 2017-12-20 MED ORDER — VITAMIN D (ERGOCALCIFEROL) 1.25 MG (50000 UNIT) PO CAPS: 50000.0000 [IU] | ORAL_CAPSULE | ORAL | 0 refills | Status: DC

## 2017-12-20 NOTE — Progress Notes (Signed)
Office: [phone]  /  Fax: [phone]   HPI:   Chief Complaint: OBESITY Crystal Wallace is here to discuss her progress with her obesity treatment plan. She is on the Category 3 plan and is following her eating plan approximately 99.9 % of the time. She states she is exercising 0 minutes 0 times per week. Crystal Wallace continues to do well with the category 3 plan. She found it easy to follow and hunger was controlled; hough it was a little challenging to eat all the dinner protein. Her weight is 279 lb (126.6 kg) today and has had a weight loss of 7 pounds over a period of 2 weeks since her last visit. She has lost 7 lbs since starting treatment with Crystal Wallace.  Vitamin D deficiency Crystal Wallace has a diagnosis of vitamin D deficiency. Crystal Wallace is on OTC vit D and she is not yet at goal. She admits fatigue and denies nausea, vomiting or muscle weakness.  Hyperlipidemia Crystal Wallace has hyperlipidemia and is on Zocor. Her LDL is at 100. She would like to improve her cholesterol levels with intensive lifestyle modification including a low saturated fat diet, exercise and weight loss. She denies any chest pain or myalgias.  Diabetes II Crystal Wallace has a diagnosis of diabetes type II. Crystal Wallace states fasting BGs ran between 96 and 120 and 2 hour post prandial BGs ran between 105 and 142 and is already improving on diet prescription. Crystal Wallace denies any hypoglycemic episodes. Last A1c was at 6.5 She has been working on intensive lifestyle modifications including diet, exercise, and weight loss to help control her blood glucose levels.  Hypertension Crystal Wallace is a 42 y.o. female with hypertension. Her blood pressure greatly improved on diet and with weight loss. Crystal Wallace is doing well with medications. [name] Gave denies chest pain. She is working weight loss to help control her blood pressure with the goal of decreasing her risk of heart attack and stroke. Crystal Wallace blood pressure is currently controlled.  At risk for  cardiovascular disease Crystal Wallace is at a higher than average risk for cardiovascular disease due to obesity, hyperlipidemia, diabetes and hypertension. She currently denies any chest pain.  ALLERGIES: No Known Allergies  MEDICATIONS: Current Outpatient Medications on File Prior to Visit  Medication Sig Dispense Refill  . amLODipine (NORVASC) 5 MG tablet One po daily 90 tablet 1  . blood glucose meter kit and supplies KIT Dispense based on patient and insurance preference. Use up to four times daily as directed. (FOR ICD-9 250.00, 250.01). 1 each 0  . carvedilol (COREG) 12.5 MG tablet TAKE 1 TABLET(12.5 MG) BY MOUTH TWICE DAILY WITH A MEAL 60 tablet 3  . cholecalciferol (VITAMIN D) 1000 UNITS tablet Take 1,000 Units by mouth daily.    . cyclobenzaprine (FLEXERIL) 10 MG tablet TAKE 1 TABLET(10 MG) BY MOUTH AT BEDTIME 30 tablet 0  . furosemide (LASIX) 40 MG tablet TAKE 1 TABLET(40 MG) BY MOUTH DAILY 90 tablet 0  . glucose blood test strip Use as instructed 100 each 11  . glucose monitoring kit (FREESTYLE) monitoring kit 1 each by Does not apply route as needed for other. 1 each 0  . levonorgestrel (MIRENA) 20 MCG/24HR IUD 1 each by Intrauterine route once.    . metFORMIN (GLUCOPHAGE-XR) 750 MG 24 hr tablet TAKE 1 TABLET(750 MG) BY MOUTH DAILY WITH BREAKFAST 30 tablet 5  . Multiple Vitamin (MULTIVITAMIN WITH MINERALS) TABS tablet Take 1 tablet by mouth daily.    Marland Kitchen olmesartan (BENICAR) 40 MG tablet Take  1 tablet (40 mg total) by mouth daily. 90 tablet 0  . Omega-3 Fatty Acids (FISH OIL) 875 MG CHEW Chew 2 each by mouth daily.    . potassium chloride SA (K-DUR,KLOR-CON) 20 MEQ tablet TAKE 1 TABLET(20 MEQ) BY MOUTH DAILY 30 tablet 0  . simvastatin (ZOCOR) 40 MG tablet Take 1 tablet (40 mg total) by mouth at bedtime. 90 tablet 3   No current facility-administered medications on file prior to visit.     PAST MEDICAL HISTORY: Past Medical History:  Diagnosis Date  . Diabetes ([name])   . HTN  (hypertension)   . Hx of blood clots   . Hyperlipidemia   . Lactose intolerance   . Vitamin D deficiency     PAST SURGICAL HISTORY: Past Surgical History:  Procedure Laterality Date  .  c section  1999  . adenoid removal age 17    . cervical 5 and cervical 6 neck surgery  12-07-2010  . ROBOT ASSISTED MYOMECTOMY  10/20/2011   Procedure: ROBOTIC ASSISTED MYOMECTOMY;  Surgeon: Crystal Crocker, MD;  Location: [facility];  Service: Gynecology;  Laterality: N/A;    SOCIAL HISTORY: Social History   Tobacco Use  . Smoking status: Never Smoker  . Smokeless tobacco: Never Used  Substance Use Topics  . Alcohol use: No  . Drug use: No    FAMILY HISTORY: Family History  Problem Relation Age of Onset  . Arthritis Mother   . Heart disease Mother   . Hyperlipidemia Mother   . Hypertension Father   . Diabetes Father   . Obesity Father     ROS: Review of Systems  Constitutional: Positive for malaise/fatigue and weight loss.  Cardiovascular: Negative for chest pain.  Gastrointestinal: Negative for nausea and vomiting.  Musculoskeletal: Negative for myalgias.       Negative for muscle weakness  Endo/Heme/Allergies:       Negative for hypoglycemia    PHYSICAL EXAM: Blood pressure 116/80, pulse 84, temperature 98.4 F (36.9 C), temperature source Oral, height '5\' 4"'$  (1.626 m), weight 279 lb (126.6 kg), SpO2 100 %. Body mass index is 47.89 kg/m. Physical Exam  Constitutional: She is oriented to person, place, and time. She appears well-developed and well-nourished.  Cardiovascular: Normal rate.  Pulmonary/Chest: Effort normal.  Musculoskeletal: Normal range of motion.  Neurological: She is oriented to person, place, and time.  Skin: Skin is warm and dry.  Psychiatric: She has a normal mood and affect. Her behavior is normal.  Vitals reviewed.   RECENT LABS AND TESTS: BMET    Component Value Date/Time   NA 139 12/06/2017 1036   K 4.1 12/06/2017 1036   CL 100 12/06/2017 1036     CO2 19 (L) 12/06/2017 1036   GLUCOSE 116 (H) 12/06/2017 1036   GLUCOSE 114 (H) 07/10/2017 0908   BUN 11 12/06/2017 1036   CREATININE 0.56 (L) 12/06/2017 1036   CREATININE 0.65 07/10/2017 0908   CALCIUM 9.6 12/06/2017 1036   GFRNONAA 116 12/06/2017 1036   GFRNONAA 110 07/10/2017 0908   GFRAA 134 12/06/2017 1036   GFRAA 128 07/10/2017 0908   Lab Results  Component Value Date   HGBA1C 6.5 (H) 12/06/2017   HGBA1C 6.4 (H) 07/10/2017   HGBA1C 6.2 (H) 05/19/2016   HGBA1C 6.6 (H) 02/07/2016   HGBA1C 6.6 (H) 11/01/2015   Lab Results  Component Value Date   INSULIN 35.9 (H) 12/06/2017   CBC    Component Value Date/Time   WBC 11.2 (H) 12/06/2017 1036  WBC 13.1 (H) 07/10/2017 0908   RBC 4.36 12/06/2017 1036   RBC 4.52 07/10/2017 0908   HGB 13.1 12/06/2017 1036   HCT 38.9 12/06/2017 1036   PLT 392 07/10/2017 0908   MCV 89 12/06/2017 1036   MCH 30.0 12/06/2017 1036   MCH 29.4 07/10/2017 0908   MCHC 33.7 12/06/2017 1036   MCHC 34.4 07/10/2017 0908   RDW 13.7 12/06/2017 1036   LYMPHSABS 4.4 (H) 12/06/2017 1036   MONOABS 0.5 04/16/2015 0901   EOSABS 0.3 12/06/2017 1036   BASOSABS 0.1 12/06/2017 1036   Iron/TIBC/Ferritin/ %Sat    Component Value Date/Time   IRON 112 04/16/2015 0901   TIBC 303 04/16/2015 0901   IRONPCTSAT 37 04/16/2015 0901   Lipid Panel     Component Value Date/Time   CHOL 179 12/06/2017 1036   TRIG 138 12/06/2017 1036   HDL 51 12/06/2017 1036   CHOLHDL 3.8 08/07/2017 0933   VLDL 28 05/19/2016 1128   LDLCALC 100 (H) 12/06/2017 1036   LDLCALC 104 (H) 08/07/2017 0933   Hepatic Function Panel     Component Value Date/Time   PROT 6.8 12/06/2017 1036   ALBUMIN 4.4 12/06/2017 1036   AST 13 12/06/2017 1036   ALT 17 12/06/2017 1036   ALKPHOS 83 12/06/2017 1036   BILITOT 0.7 12/06/2017 1036   BILIDIR 0.2 08/07/2017 0933   IBILI 0.9 08/07/2017 0933      Component Value Date/Time   TSH 1.480 12/06/2017 1036   TSH 1.63 07/10/2017 0908   TSH 0.986  04/16/2015 0901   Results for KENSI, KARR (MRN [id number]) as of 12/20/2017 15:51  Ref. Range 12/06/2017 10:36  Vitamin D, 25-Hydroxy Latest Ref Range: 30.0 - 100.0 ng/mL 27.7 (L)   ASSESSMENT AND PLAN: Vitamin D deficiency - Plan: Vitamin D, Ergocalciferol, (DRISDOL) 50000 units CAPS capsule  Other hyperlipidemia  Type 2 diabetes mellitus with microalbuminuria, without long-term current use of insulin ([facility])  Essential hypertension  At risk for heart disease  Class 3 severe obesity with serious comorbidity and body mass index (BMI) of 45.0 to 49.9 in adult, unspecified obesity type ([place])  PLAN:  Vitamin D Deficiency Norleen was informed that low vitamin D levels contributes to fatigue and are associated with obesity, breast, and colon cancer. She agrees to start prescription Vit D '@50'$ ,000 IU every week #4 with no refills and will follow up for routine testing of vitamin D, at least 2-3 times per year. She was informed of the risk of over-replacement of vitamin D and agrees to not increase her dose unless she discusses this with Crystal Wallace first. Danna agrees to follow up as directed.  Hyperlipidemia Nyna was informed of the American Heart Association Guidelines emphasizing intensive lifestyle modifications as the first line treatment for hyperlipidemia. We discussed many lifestyle modifications today in depth, and Julie-Ann will continue to work on decreasing saturated fats such as fatty red meat, butter and many fried foods. She will also increase vegetables and lean protein in her diet and continue to work on exercise and weight loss efforts. Dorlene will continue Zocor and follow up as directed.  Diabetes II Ameriah has been given extensive diabetes education by myself today including ideal fasting and post-prandial blood glucose readings, individual ideal Hgb A1c goals and hypoglycemia prevention. We discussed the importance of good blood sugar control to decrease the likelihood of  diabetic complications such as nephropathy, neuropathy, limb loss, blindness, coronary artery disease, and death. We discussed the importance of intensive lifestyle modification including  diet, exercise and weight loss as the first line treatment for diabetes. Keeshia agrees to continue her diabetes medications and diet. Hiilei will follow up at the agreed upon time.  Hypertension We discussed sodium restriction, working on healthy weight loss, and a regular exercise program as the means to achieve improved blood pressure control. Cieara agreed with this plan and agreed to follow up as directed. We will continue to monitor her blood pressure as well as her progress with the above lifestyle modifications. She will continue her medications as prescribed and will watch for signs of hypotension as she continues her lifestyle modifications.  Cardiovascular risk counseling Sherree was given extended (15 minutes) coronary artery disease prevention counseling today. She is 42 y.o. female and has risk factors for heart disease including obesity, hyperlipidemia, diabetes and hypertension. We discussed intensive lifestyle modifications today with an emphasis on specific weight loss instructions and strategies. Pt was also informed of the importance of increasing exercise and decreasing saturated fats to help prevent heart disease.  Obesity Marvalene is currently in the action stage of change. As such, her goal is to continue with weight loss efforts She has agreed to follow the Category 3 plan Dorette has been instructed to work up to a goal of 150 minutes of combined cardio and strengthening exercise per week for weight loss and overall health benefits. We discussed the following Behavioral Modification Strategies today: better snacking choices, increasing lean protein intake, decreasing simple carbohydrates  and work on meal planning and easy cooking plans  Aquila has agreed to follow up with our clinic in 2  weeks. She was informed of the importance of frequent follow up visits to maximize her success with intensive lifestyle modifications for her multiple health conditions.   OBESITY BEHAVIORAL INTERVENTION VISIT  Today's visit was # 2 out of 22.  Starting weight: 286 lbs Starting date: 12/06/17 Today's weight : 279 lbs Today's date: 12/20/2017 Total lbs lost to date: 7    ASK: We discussed the diagnosis of obesity with [name] Gave today and [name] agreed to give Crystal Wallace permission to discuss obesity behavioral modification therapy today.  ASSESS: Kalyna has the diagnosis of obesity and her BMI today is 47.87 Zlata is in the action stage of change   ADVISE: Kinslee was educated on the multiple health risks of obesity as well as the benefit of weight loss to improve her health. She was advised of the need for long term treatment and the importance of lifestyle modifications.  AGREE: Multiple dietary modification options and treatment options were discussed and  Leith agreed to the above obesity treatment plan.  I, Doreene Nest, am acting as transcriptionist for Dennard Nip, MD  I have reviewed the above documentation for accuracy and completeness, and I agree with the above. -Dennard Nip, MD

## 2017-12-28 ENCOUNTER — Encounter: Payer: Self-pay | Admitting: Pharmacist

## 2017-12-28 ENCOUNTER — Ambulatory Visit (INDEPENDENT_AMBULATORY_CARE_PROVIDER_SITE_OTHER): Payer: Managed Care, Other (non HMO) | Admitting: Pharmacist

## 2017-12-28 VITALS — BP 116/78 | HR 62

## 2017-12-28 DIAGNOSIS — I10 Essential (primary) hypertension: Secondary | ICD-10-CM

## 2017-12-28 NOTE — Assessment & Plan Note (Signed)
Blood pressure currently at desired goal of <130/80. Patient reports better compliance with medication now that she can affort it. She also has significant changes in lifestyle. Patient will continue to follow up with Dr Leafy Ro for weight management , pre-diabetes, and HTN.  I will continue all medication as currently prescribed and encouraged patient to call clinic as needed.

## 2017-12-28 NOTE — Patient Instructions (Signed)
Return for a  follow up appointment as needed  Check your blood pressure at home daily (if able) and keep record of the readings.  Take your BP meds as follows: *NO MEDICATION CHANGE*  Bring all of your meds, your BP cuff and your record of home blood pressures to your next appointment.  Exercise as you're able, try to walk approximately 30 minutes per day.  Keep salt intake to a minimum, especially watch canned and prepared boxed foods.  Eat more fresh fruits and vegetables and fewer canned items.  Avoid eating in fast food restaurants.    HOW TO TAKE YOUR BLOOD PRESSURE: . Rest 5 minutes before taking your blood pressure. .  Don't smoke or drink caffeinated beverages for at least 30 minutes before. . Take your blood pressure before (not after) you eat. . Sit comfortably with your back supported and both feet on the floor (don't cross your legs). . Elevate your arm to heart level on a table or a desk. . Use the proper sized cuff. It should fit smoothly and snugly around your bare upper arm. There should be enough room to slip a fingertip under the cuff. The bottom edge of the cuff should be 1 inch above the crease of the elbow. . Ideally, take 3 measurements at one sitting and record the average.    

## 2017-12-28 NOTE — Progress Notes (Signed)
Patient ID: Crystal Wallace                 DOB: Sep 07, 1975                      MRN: [id number]     HPI: Crystal Wallace is a 42 y.o. female referred by Dr. [name] Gave to HTN clinic. PMH includes diabetes, hypertension, and hyperlipidemia.  Patient presents o HTN clinic for initial assessment but reports significant changed since last visit with DR [name] Gave. She is currently on a weight management and wellness program with Dr Leafy Ro and reports 7lb loss in 2 weeks. She also noted resolution of LEE and better BP reading during doctors' visits.  Patient was having problems to afford her BP medication but was able to find an affordable solution by changing pharmacy and now reports proper compliance with therapy.  Current HTN meds:  Amlodipine '5mg'$  daily Carvedilol 12.'5mg'$  twice daily Furosemide '40mg'$  daily Olmesartan '40mg'$  daily  Previously tried:  telmisartan Losartan Irbesartan HCTZ - d/c to start furosemide  BP goal: <130/80  Family History: family history includes Arthritis in her mother; Diabetes in her sister; Hypertension in her sister and father has high blood pressure.  [name] with history of MI. [name] has history of MI in her 45s. Brother has possible glaucoma but otherwise health, sister is generally healthy (hysterectome at age 65).  Social History: denies tobacco and alcohol use  Diet: low carbohydrate diet - working with Dr Leafy Ro  Exercise: activity of daily living  Home BP readings: no records provided today *Home meter accurate on forearm with property technique*  Wt Readings from Last 3 Encounters:  12/20/17 279 lb (126.6 kg)  12/06/17 286 lb (129.7 kg)  11/12/17 288 lb 3.2 oz (130.7 kg)   BP Readings from Last 3 Encounters:  12/28/17 116/78  12/20/17 116/80  12/06/17 (!) 144/84   Pulse Readings from Last 3 Encounters:  12/28/17 62  12/20/17 84  12/06/17 81    Past Medical History:  Diagnosis Date  . Diabetes ([place])   . HTN (hypertension)   . Hx of  blood clots   . Hyperlipidemia   . Lactose intolerance   . Vitamin D deficiency     Current Outpatient Medications on File Prior to Visit  Medication Sig Dispense Refill  . acetaminophen (TYLENOL) 500 MG tablet Take 1,000 mg by mouth at bedtime as needed for mild pain.    Marland Kitchen amLODipine (NORVASC) 5 MG tablet One po daily 90 tablet 1  . blood glucose meter kit and supplies KIT Dispense based on patient and insurance preference. Use up to four times daily as directed. (FOR ICD-9 250.00, 250.01). 1 each 0  . carvedilol (COREG) 12.5 MG tablet TAKE 1 TABLET(12.5 MG) BY MOUTH TWICE DAILY WITH A MEAL 60 tablet 3  . cholecalciferol (VITAMIN D) 1000 UNITS tablet Take 1,000 Units by mouth daily.    . cyclobenzaprine (FLEXERIL) 10 MG tablet TAKE 1 TABLET(10 MG) BY MOUTH AT BEDTIME 30 tablet 0  . furosemide (LASIX) 40 MG tablet TAKE 1 TABLET(40 MG) BY MOUTH DAILY 90 tablet 0  . glucose blood test strip Use as instructed 100 each 11  . glucose monitoring kit (FREESTYLE) monitoring kit 1 each by Does not apply route as needed for other. 1 each 0  . levonorgestrel (MIRENA) 20 MCG/24HR IUD 1 each by Intrauterine route once.    . metFORMIN (GLUCOPHAGE-XR) 750 MG 24 hr tablet TAKE 1 TABLET(750  MG) BY MOUTH DAILY WITH BREAKFAST 30 tablet 5  . Multiple Vitamin (MULTIVITAMIN WITH MINERALS) TABS tablet Take 1 tablet by mouth daily.    Marland Kitchen olmesartan (BENICAR) 40 MG tablet Take 1 tablet (40 mg total) by mouth daily. 90 tablet 0  . Omega-3 Fatty Acids (FISH OIL) 1000 MG CAPS Take 1 capsule by mouth daily.    . potassium chloride SA (K-DUR,KLOR-CON) 20 MEQ tablet TAKE 1 TABLET(20 MEQ) BY MOUTH DAILY 30 tablet 0  . simvastatin (ZOCOR) 40 MG tablet Take 1 tablet (40 mg total) by mouth at bedtime. 90 tablet 3  . Vitamin D, Ergocalciferol, (DRISDOL) 50000 units CAPS capsule Take 1 capsule (50,000 Units total) by mouth every 7 (seven) days. 4 capsule 0   No current facility-administered medications on file prior to visit.       No Known Allergies  Blood pressure 116/78, pulse 62.  Resistant hypertension Blood pressure currently at desired goal of <130/80. Patient reports better compliance with medication now that she can affort it. She also has significant changes in lifestyle. Patient will continue to follow up with Dr Leafy Ro for weight management , pre-diabetes, and HTN.  I will continue all medication as currently prescribed and encouraged patient to call clinic as needed.    Herlinda Heady Rodriguez-Guzman PharmD, BCPS, [place] [address] 12/28/2017 5:48 PM

## 2018-01-03 ENCOUNTER — Other Ambulatory Visit: Payer: Self-pay | Admitting: Internal Medicine

## 2018-01-03 MED ORDER — METFORMIN HCL ER 750 MG PO TB24: ORAL_TABLET | ORAL | 5 refills | Status: DC

## 2018-01-03 MED ORDER — POTASSIUM CHLORIDE CRYS ER 20 MEQ PO TBCR: EXTENDED_RELEASE_TABLET | ORAL | 0 refills | Status: DC

## 2018-01-03 NOTE — Telephone Encounter (Signed)
Does she want these at [facility]

## 2018-01-10 ENCOUNTER — Ambulatory Visit (INDEPENDENT_AMBULATORY_CARE_PROVIDER_SITE_OTHER): Payer: Managed Care, Other (non HMO) | Admitting: Family Medicine

## 2018-01-10 VITALS — BP 130/76 | HR 58 | Temp 97.8°F | Ht 64.0 in | Wt 272.0 lb

## 2018-01-10 DIAGNOSIS — E559 Vitamin D deficiency, unspecified: Secondary | ICD-10-CM

## 2018-01-10 DIAGNOSIS — Z6841 Body Mass Index (BMI) 40.0 and over, adult: Secondary | ICD-10-CM

## 2018-01-10 DIAGNOSIS — E1129 Type 2 diabetes mellitus with other diabetic kidney complication: Secondary | ICD-10-CM | POA: Diagnosis not present

## 2018-01-10 DIAGNOSIS — Z9189 Other specified personal risk factors, not elsewhere classified: Secondary | ICD-10-CM | POA: Diagnosis not present

## 2018-01-10 DIAGNOSIS — R809 Proteinuria, unspecified: Secondary | ICD-10-CM

## 2018-01-11 ENCOUNTER — Other Ambulatory Visit: Payer: Self-pay | Admitting: Internal Medicine

## 2018-01-14 MED ORDER — VITAMIN D (ERGOCALCIFEROL) 1.25 MG (50000 UNIT) PO CAPS: 50000.0000 [IU] | ORAL_CAPSULE | ORAL | 0 refills | Status: DC

## 2018-01-15 DIAGNOSIS — E559 Vitamin D deficiency, unspecified: Secondary | ICD-10-CM | POA: Insufficient documentation

## 2018-01-15 NOTE — Progress Notes (Signed)
Office: [phone]  /  Fax: [phone]   HPI:   Chief Complaint: OBESITY Crystal Wallace is here to discuss her progress with her obesity treatment plan. She is on the Category 3 plan and is following her eating plan approximately 99 % of the time. She states she is exercising 0 minutes 0 times per week. Crystal Wallace continues to do well with weight loss. Hunger is controlled and she is happy with her category 3 plan. Her weight is 272 lb (123.4 kg) today and has had a weight loss of 7 pounds over a period of 3 weeks since her last visit. She has lost 14 lbs since starting treatment with [name].  Vitamin D deficiency Crystal Wallace has a diagnosis of vitamin D deficiency. Crystal Wallace is stable on vit D, but she is not yet at goal. She denies nausea, vomiting or muscle weakness.  Diabetes II Crystal Wallace has a diagnosis of diabetes type II. Crystal Wallace does not have her blood sugar log today and she denies any hypoglycemic episodes, nausea or vomiting. She is stable on metformin and her last A1c was well controlled at 6.5 She has been working on intensive lifestyle modifications including diet, exercise, and weight loss to help control her blood glucose levels.  At risk for cardiovascular disease Crystal Wallace is at a higher than average risk for cardiovascular disease due to obesity and diabetes. She currently denies any chest pain.  ALLERGIES: No Known Allergies  MEDICATIONS: Current Outpatient Medications on File Prior to Visit  Medication Sig Dispense Refill  . acetaminophen (TYLENOL) 500 MG tablet Take 1,000 mg by mouth at bedtime as needed for mild pain.    Marland Kitchen amLODipine (NORVASC) 5 MG tablet One po daily 90 tablet 1  . blood glucose meter kit and supplies KIT Dispense based on patient and insurance preference. Use up to four times daily as directed. (FOR ICD-9 250.00, 250.01). 1 each 0  . carvedilol (COREG) 12.5 MG tablet TAKE 1 TABLET(12.5 MG) BY MOUTH TWICE DAILY WITH A MEAL 60 tablet 3  . cholecalciferol (VITAMIN  D) 1000 UNITS tablet Take 1,000 Units by mouth daily.    . cyclobenzaprine (FLEXERIL) 10 MG tablet TAKE 1 TABLET(10 MG) BY MOUTH AT BEDTIME 30 tablet 0  . furosemide (LASIX) 40 MG tablet TAKE 1 TABLET(40 MG) BY MOUTH DAILY 90 tablet 0  . glucose blood test strip Use as instructed 100 each 11  . glucose monitoring kit (FREESTYLE) monitoring kit 1 each by Does not apply route as needed for other. 1 each 0  . levonorgestrel (MIRENA) 20 MCG/24HR IUD 1 each by Intrauterine route once.    . metFORMIN (GLUCOPHAGE-XR) 750 MG 24 hr tablet TAKE 1 TABLET(750 MG) BY MOUTH DAILY WITH BREAKFAST 30 tablet 5  . Multiple Vitamin (MULTIVITAMIN WITH MINERALS) TABS tablet Take 1 tablet by mouth daily.    Marland Kitchen olmesartan (BENICAR) 40 MG tablet Take 1 tablet (40 mg total) by mouth daily. 90 tablet 0  . Omega-3 Fatty Acids (FISH OIL) 1000 MG CAPS Take 1 capsule by mouth daily.    . potassium chloride SA (K-DUR,KLOR-CON) 20 MEQ tablet TAKE 1 TABLET(20 MEQ) BY MOUTH DAILY 30 tablet 0  . simvastatin (ZOCOR) 40 MG tablet Take 1 tablet (40 mg total) by mouth at bedtime. 90 tablet 3   No current facility-administered medications on file prior to visit.     PAST MEDICAL HISTORY: Past Medical History:  Diagnosis Date  . Diabetes ([place])   . HTN (hypertension)   . Hx of blood clots   .  Hyperlipidemia   . Lactose intolerance   . Vitamin D deficiency     PAST SURGICAL HISTORY: Past Surgical History:  Procedure Laterality Date  .  c section  1999  . adenoid removal age 34    . cervical 5 and cervical 6 neck surgery  12-07-2010  . ROBOT ASSISTED MYOMECTOMY  10/20/2011   Procedure: ROBOTIC ASSISTED MYOMECTOMY;  Surgeon: Lahoma Crocker, MD;  Location: [facility];  Service: Gynecology;  Laterality: N/A;    SOCIAL HISTORY: Social History   Tobacco Use  . Smoking status: Never Smoker  . Smokeless tobacco: Never Used  Substance Use Topics  . Alcohol use: No  . Drug use: No    FAMILY HISTORY: Family History  Problem  Relation Age of Onset  . Arthritis Mother   . Heart disease Mother   . Hyperlipidemia Mother   . Hypertension Father   . Diabetes Father   . Obesity Father     ROS: Review of Systems  Constitutional: Positive for weight loss.  Cardiovascular: Negative for chest pain.  Gastrointestinal: Negative for nausea and vomiting.  Musculoskeletal:       Negative for muscle weakness  Endo/Heme/Allergies:       Negative for hypoglycemia    PHYSICAL EXAM: Blood pressure 130/76, pulse (!) 58, temperature 97.8 F (36.6 C), temperature source Oral, height '5\' 4"'$  (1.626 m), weight 272 lb (123.4 kg), SpO2 96 %. Body mass index is 46.69 kg/m. Physical Exam  Constitutional: She is oriented to person, place, and time. She appears well-developed and well-nourished.  Cardiovascular: Normal rate.  Pulmonary/Chest: Effort normal.  Musculoskeletal: Normal range of motion.  Neurological: She is oriented to person, place, and time.  Skin: Skin is warm and dry.  Psychiatric: She has a normal mood and affect. Her behavior is normal.  Vitals reviewed.   RECENT LABS AND TESTS: BMET    Component Value Date/Time   NA 139 12/06/2017 1036   K 4.1 12/06/2017 1036   CL 100 12/06/2017 1036   CO2 19 (L) 12/06/2017 1036   GLUCOSE 116 (H) 12/06/2017 1036   GLUCOSE 114 (H) 07/10/2017 0908   BUN 11 12/06/2017 1036   CREATININE 0.56 (L) 12/06/2017 1036   CREATININE 0.65 07/10/2017 0908   CALCIUM 9.6 12/06/2017 1036   GFRNONAA 116 12/06/2017 1036   GFRNONAA 110 07/10/2017 0908   GFRAA 134 12/06/2017 1036   GFRAA 128 07/10/2017 0908   Lab Results  Component Value Date   HGBA1C 6.5 (H) 12/06/2017   HGBA1C 6.4 (H) 07/10/2017   HGBA1C 6.2 (H) 05/19/2016   HGBA1C 6.6 (H) 02/07/2016   HGBA1C 6.6 (H) 11/01/2015   Lab Results  Component Value Date   INSULIN 35.9 (H) 12/06/2017   CBC    Component Value Date/Time   WBC 11.2 (H) 12/06/2017 1036   WBC 13.1 (H) 07/10/2017 0908   RBC 4.36 12/06/2017 1036     RBC 4.52 07/10/2017 0908   HGB 13.1 12/06/2017 1036   HCT 38.9 12/06/2017 1036   PLT 392 07/10/2017 0908   MCV 89 12/06/2017 1036   MCH 30.0 12/06/2017 1036   MCH 29.4 07/10/2017 0908   MCHC 33.7 12/06/2017 1036   MCHC 34.4 07/10/2017 0908   RDW 13.7 12/06/2017 1036   LYMPHSABS 4.4 (H) 12/06/2017 1036   MONOABS 0.5 04/16/2015 0901   EOSABS 0.3 12/06/2017 1036   BASOSABS 0.1 12/06/2017 1036   Iron/TIBC/Ferritin/ %Sat    Component Value Date/Time   IRON 112 04/16/2015 0901  TIBC 303 04/16/2015 0901   IRONPCTSAT 37 04/16/2015 0901   Lipid Panel     Component Value Date/Time   CHOL 179 12/06/2017 1036   TRIG 138 12/06/2017 1036   HDL 51 12/06/2017 1036   CHOLHDL 3.8 08/07/2017 0933   VLDL 28 05/19/2016 1128   LDLCALC 100 (H) 12/06/2017 1036   LDLCALC 104 (H) 08/07/2017 0933   Hepatic Function Panel     Component Value Date/Time   PROT 6.8 12/06/2017 1036   ALBUMIN 4.4 12/06/2017 1036   AST 13 12/06/2017 1036   ALT 17 12/06/2017 1036   ALKPHOS 83 12/06/2017 1036   BILITOT 0.7 12/06/2017 1036   BILIDIR 0.2 08/07/2017 0933   IBILI 0.9 08/07/2017 0933      Component Value Date/Time   TSH 1.480 12/06/2017 1036   TSH 1.63 07/10/2017 0908   TSH 0.986 04/16/2015 0901   Results for DONELDA, MAILHOT (MRN [id number]) as of 01/15/2018 08:27  Ref. Range 12/06/2017 10:36  Vitamin D, 25-Hydroxy Latest Ref Range: 30.0 - 100.0 ng/mL 27.7 (L)   ASSESSMENT AND PLAN: Vitamin D deficiency - Plan: Vitamin D, Ergocalciferol, (DRISDOL) 50000 units CAPS capsule  Type 2 diabetes mellitus without complication, without long-term current use of insulin (HCC)  At risk for heart disease  Class 3 severe obesity with serious comorbidity and body mass index (BMI) of 45.0 to 49.9 in adult, unspecified obesity type (Crystal Wallace)  PLAN:  Vitamin D Deficiency Crystal Wallace was informed that low vitamin D levels contributes to fatigue and are associated with obesity, breast, and colon cancer. She agrees to  continue to take prescription Vit D '@50'$ ,000 IU every week #4 with no refills and will follow up for routine testing of vitamin D, at least 2-3 times per year. She was informed of the risk of over-replacement of vitamin D and agrees to not increase her dose unless she discusses this with [name] first. Crystal Wallace agrees to follow up as directed.  Diabetes II Crystal Wallace has been given extensive diabetes education by myself today including ideal fasting and post-prandial blood glucose readings, individual ideal Hgb A1c goals and hypoglycemia prevention. We discussed the importance of good blood sugar control to decrease the likelihood of diabetic complications such as nephropathy, neuropathy, limb loss, blindness, coronary artery disease, and death. We discussed the importance of intensive lifestyle modification including diet, exercise and weight loss as the first line treatment for diabetes. Crystal Wallace agrees to continue metformin and diet prescription. We will recheck labs in 3 months and Crystal Wallace will follow up at the agreed upon time.  Cardiovascular risk counseling Crystal Wallace was given extended (15 minutes) coronary artery disease prevention counseling today. She is 42 y.o. female and has risk factors for heart disease including obesity and diabetes. We discussed intensive lifestyle modifications today with an emphasis on specific weight loss instructions and strategies. Pt was also informed of the importance of increasing exercise and decreasing saturated fats to help prevent heart disease.  Obesity Crystal Wallace is currently in the action stage of change. As such, her goal is to continue with weight loss efforts She has agreed to follow the Category 3 plan Crystal Wallace will start planning on how to work exercise into her diet for weight loss and overall health benefits. We discussed the following Behavioral Modification Strategies today: celebration eating strategies, increasing lean protein intake and decreasing simple  carbohydrates   Crystal Wallace has agreed to follow up with our clinic in 2 to 3 weeks. She was informed of the importance of frequent follow  up visits to maximize her success with intensive lifestyle modifications for her multiple health conditions.   OBESITY BEHAVIORAL INTERVENTION VISIT  Today's visit was # 3   Starting weight: 286 lbs Starting date: 12/06/17 Today's weight : 272 lbs Today's date: 01/10/2018 Total lbs lost to date: 14   ASK: We discussed the diagnosis of obesity with Crystal Wallace today and Crystal Wallace agreed to give [name] permission to discuss obesity behavioral modification therapy today.  ASSESS: Crystal Wallace has the diagnosis of obesity and her BMI today is 46.67 Crystal Wallace is in the action stage of change   ADVISE: Crystal Wallace was educated on the multiple health risks of obesity as well as the benefit of weight loss to improve her health. She was advised of the need for long term treatment and the importance of lifestyle modifications to improve her current health and to decrease her risk of future health problems.  AGREE: Multiple dietary modification options and treatment options were discussed and  Taiesha agreed to follow the recommendations documented in the above note.  ARRANGE: Mayara was educated on the importance of frequent visits to treat obesity as outlined per CMS and USPSTF guidelines and agreed to schedule her next follow up appointment today.  I, Crystal Wallace, am acting as transcriptionist for Dennard Nip, MD  I have reviewed the above documentation for accuracy and completeness, and I agree with the above. -Dennard Nip, MD

## 2018-01-17 ENCOUNTER — Ambulatory Visit (INDEPENDENT_AMBULATORY_CARE_PROVIDER_SITE_OTHER): Payer: Managed Care, Other (non HMO) | Admitting: Internal Medicine

## 2018-01-17 ENCOUNTER — Ambulatory Visit (INDEPENDENT_AMBULATORY_CARE_PROVIDER_SITE_OTHER): Payer: Self-pay | Admitting: Psychology

## 2018-01-17 ENCOUNTER — Encounter: Payer: Self-pay | Admitting: Internal Medicine

## 2018-01-17 VITALS — BP 130/90 | HR 84 | Temp 98.1°F

## 2018-01-17 DIAGNOSIS — Z23 Encounter for immunization: Secondary | ICD-10-CM | POA: Diagnosis not present

## 2018-01-17 NOTE — Progress Notes (Unsigned)
  Office: [phone]  /  Fax: [phone]   Date: January 17, 2018   Time Seen:*** Duration:*** Provider: Glennie Isle, Psy.D. Type of Session: Individual Therapy   HPI: ***  Session Content: Session focused on the following treatment goal: {gbtxgoals:21759}. The session was initiated with the administration of the PHQ-9 and GAD-7, as well as a brief check-in.   Maribella was receptive to today's session as evidenced by ***.  Mental Status Examination: Morgyn arrived on time for the appointment. She presented as appropriately dressed and groomed. Ayodele appeared her stated age and demonstrated adequate orientation to time, place, person, and purpose of the appointment. She also demonstrated appropriate eye contact. No psychomotor abnormalities or behavioral peculiarities noted. Her mood was {gbmood:21757} with congruent affect. Her thought processes were logical, linear, and goal-directed. No hallucinations, delusions, bizarre thinking or behavior reported or observed. Judgment, insight, and impulse control appeared to be grossly intact. There was no evidence of paraphasias (i.e., errors in speech, gross mispronunciations, and word substitutions), repetition deficits, or disturbances in volume or prosody (i.e., rhythm and intonation). There was no evidence of attention or memory impairments. Damyia denied current suicidal and homicidal ideation, intent or plan.  Structured Assessment Results: The Patient Health Questionnaire-9 (PHQ-9) is a self-report measure that assesses symptoms and severity of depression over the course of the last two weeks. Stasha obtained a score of *** suggesting {GBPHQ9SEVERITY:21752}. Almena finds the endorsed symptoms to be {gbphq9difficulty:21754}.  The Generalized Anxiety Disorder-7 (GAD-7) is a brief self-report measure that assesses symptoms of anxiety over the course of the last two weeks. Shayli obtained a score of *** suggesting  {gbgad7severity:21753}.  Interventions: Yuridiana was administered the PHQ-9 and GAD-7 for symptom monitoring. Content from the last session was reviewed. Throughout today's session, empathic reflections and validation were provided. Psychoeducation regarding *** was provided and *** [insert other interventions].   DSM-5 Diagnosis: 300.09 (F41.8) Other Specified Anxiety Disorder, Generalized anxiety not occurring more days than not and Emotional eating  Treatment Goal & Progress: Lutie was seen for an initial appointment with this provider on *** during which the following treatment goal was established: {gbtxgoals:21759}. Nevia has demonstrated progress in her goal of *** as evidenced by ***  Plan: Breunna continues to appear able and willing to participate as evidenced by engagement in reciprocal conversation, and asking questions for clarification as appropriate.*** The next appointment will be scheduled in {gbweeks:21758}. The next session will focus on reviewing learned skills, and working towards the established treatment goal.***

## 2018-01-17 NOTE — Patient Instructions (Signed)
Patient received a pneumovax 23 IM L deltoid, AV, CMA

## 2018-01-17 NOTE — Progress Notes (Signed)
Pneumocoocal 23 given by CMA

## 2018-01-17 NOTE — Progress Notes (Signed)
Office: [phone]  /  Fax: [phone]   Date: January 21, 2018 Time Seen: 9:30am Duration: 35 minutes Provider: Glennie Isle, Psy.D. Type of Session: Individual Therapy   HPI: Latarsha was referred by Dr. Dennard Nip and was seen for an initial appointment with this provider on December 20, 2017. Per the note for the initial visit with Dr. Dennard Nip on December 06, 2017, [name] reported experiencing the following: picky eater and doesn't like to eat healthier foods; skips meals frequently; frequently drinks liquids with calories; frequently makes poor food choices; frequently eats larger portions than normal; and struggles with emotional eating.  The note for the initial visit with Dr. Leafy Ro further discussed Mariam has been having most of her life does not know when she started gaining weight her heaviest weight ever was 290 pounds. During the initial appointment with this provider, [name] shared, "I don't think I am an emotional eater." She noted, "I eat when I am hungry." Since starting with the clinic, Temia explained she has not been as hungry as she used to be. Moreover, she indicated during her initial appointment with Dr. Leafy Ro, they discussed that Alexiya believes she is not an emotional eater, but there is still a possibility she may be. She shared she craves salty and crunchy foods (e.g., potato chips). Caria explained she would crave the aforementioned when menstruating. Furthermore, she shared she would eat certain things out of habit. She expressed worry about her ability to continue being successful with the meal plan. More specifically, she discussed birthdays earlier this year caused her to be unsuccessful with the healthy changes she was trying to implement. Additionally, Lawnda described engaging in "all or nothing" thinking as she decribed various foods (e.g., cake) she cannot have at all as they are not noted on her current meal plan. Cayman was asked to complete a  questionnaire assessing various behaviors related to emotional eating. Adalie endorsed the following: overeat when you are celebrating. She denied binging, purging, and other compensatory strategies. Trent denied ever being diagnosed with an eating disorder. Previously, [name] shared she tried to diet "a whole lot of times." She described "doing good" for a period of time, but then she would begin eating whatever she wanted to. During today's appointment, [name] discussed eating out on days where she was tired from work and did not want to come home and cook. She also described engaging in all or nothing thinking as it relates to success with following her prescribed meal plan.   Session Content: Session focused on the following treatment goal: decrease symptoms of anxiety. The session was initiated with the administration of the PHQ-9 and GAD-7, as well as a brief check-in. Information concerning the practice, financial arrangements, and confidentiality and patients' rights were discussed during the initial appointment; however, per this provider's new office policy, Aubrie was asked to sign a service agreement related to the aforementioned. The agreement was reviewed with, and signed by, [name]. Rylyn shared, "I'm not feeling too good today. I think I have another ear infection." She indicated she scheduled a doctor's appointment for this afternoon. Regarding eating, Albina reported, "There were some days I didn't do well." She described eating out during days where she was busy and "did not feel like cooking." Gaynell acknowledged she did not utilize the handouts provided during the last appointment; therefore, today's appointment focused on reviewing the handouts. She was provided additional copies. Regarding triggers for emotional eating, Kaliopi acknowledged fatigue and stress are triggers. While discussing triggers, Kem  described engaging in "all or nothing" thinking last weekend when she  wanted to eat foods that were not on her meal plan. She shared having the thought, "This isn't for me. I'm done." Nevertheless, Sidnee indicated she often "gravitates" towards food on the 100 calorie snack list provided when she experiences cravings for certain foods. Session also focused on her daughter's upcoming birthday. Enisa shared she had the opportunity to discuss strategies with Dr. Leafy Ro during their last appointment. Further strategies were also discussed during today's appointment (e.g., packing half of the meal in a to go box as soon as the meal arrives). Overall, Dietrich was receptive to today's session as evidenced by her openness in sharing and responsiveness to feedback. She also agreed to utilize the triggers for emotional eating between now and the next appointment to determine if she experiences any other triggers.   Mental Status Examination: Sherrian arrived early for the appointment. She presented as appropriately dressed and groomed. Charde appeared her stated age and demonstrated adequate orientation to time, place, person, and purpose of the appointment. She also demonstrated appropriate eye contact. No psychomotor abnormalities or behavioral peculiarities noted. Her mood was euthymic with congruent affect. Her thought processes were logical, linear, and goal-directed. No hallucinations, delusions, bizarre thinking or behavior reported or observed. Judgment, insight, and impulse control appeared to be grossly intact. There was no evidence of paraphasias (i.e., errors in speech, gross mispronunciations, and word substitutions), repetition deficits, or disturbances in volume or prosody (i.e., rhythm and intonation). There was no evidence of attention or memory impairments. Leith denied current suicidal and homicidal ideation, intent or plan.  Structured Assessment Results: The Patient Health Questionnaire-9 (PHQ-9) is a self-report measure that assesses symptoms and severity of  depression over the course of the last two weeks. Marsheila obtained a score of two suggesting minimal depression. Hatsumi finds the endorsed symptoms to be not difficult at all. Depression screen [facility] 2/9 01/21/2018  Decreased Interest 0  Down, Depressed, Hopeless 0  PHQ - 2 Score 0  Altered sleeping 0  Tired, decreased energy 1  Change in appetite 1  Feeling bad or failure about yourself  0  Trouble concentrating 0  Moving slowly or fidgety/restless 0  Suicidal thoughts 0  PHQ-9 Score 2  Difficult doing work/chores -   The Generalized Anxiety Disorder-7 (GAD-7) is a brief self-report measure that assesses symptoms of anxiety over the course of the last two weeks. Coby obtained a score of zero.  GAD 7 : Generalized Anxiety Score 01/21/2018  Nervous, Anxious, on Edge 0  Control/stop worrying 0  Worry too much - different things 0  Trouble relaxing 0  Restless 0  Easily annoyed or irritable 0  Afraid - awful might happen 0  Total GAD 7 Score 0  Anxiety Difficulty -   Interventions: Yoshiko was administered the PHQ-9 and GAD-7 for symptom monitoring. Throughout today's session, empathic reflections and validation were provided. Quintina acknowledged she did not review the handouts provided during the last appointment; therefore, the emotional and physical hunger handout was reviewed and psychoeducation regarding triggers for emotional eating was provided and triggers were discussed.   DSM-5 Diagnosis: 300.09 (F41.8) Other Specified Anxiety Disorder, Generalized anxiety not occurring more days than not and Emotional eating  Treatment Goal & Progress: Taylin was seen for an initial appointment with this provider on December 20, 2017 during which the following treatment goal was established: decrease symptoms of anxiety.  Symantha has demonstrated progress in her goal of decreasing symptoms of  anxiety as evidenced by her openness in discussing emotional eating and worry about her success on her  daughter's birthday. She identified stress and fatigue as triggers for emotional eating during today's appointment.   Plan: Hillari continues to appear able and willing to participate as evidenced by engagement in reciprocal conversation, and asking questions for clarification as appropriate. The next appointment will be scheduled in two weeks. The next session will focus on reviewing triggers for emotional eating to determine if Katurah was able to identify any others between appointments. The next appointment will also focus on cognitive distortions (e.g., all or nothing), which may be contributing to overall anxiety.

## 2018-01-21 ENCOUNTER — Ambulatory Visit (INDEPENDENT_AMBULATORY_CARE_PROVIDER_SITE_OTHER): Payer: Self-pay | Admitting: Psychology

## 2018-01-21 ENCOUNTER — Ambulatory Visit (INDEPENDENT_AMBULATORY_CARE_PROVIDER_SITE_OTHER): Payer: Managed Care, Other (non HMO) | Admitting: Internal Medicine

## 2018-01-21 ENCOUNTER — Encounter: Payer: Self-pay | Admitting: Internal Medicine

## 2018-01-21 VITALS — BP 130/80 | HR 80 | Temp 99.1°F | Ht 64.0 in | Wt 272.0 lb

## 2018-01-21 DIAGNOSIS — H6692 Otitis media, unspecified, left ear: Secondary | ICD-10-CM

## 2018-01-21 DIAGNOSIS — I10 Essential (primary) hypertension: Secondary | ICD-10-CM

## 2018-01-21 DIAGNOSIS — F418 Other specified anxiety disorders: Secondary | ICD-10-CM

## 2018-01-21 DIAGNOSIS — L304 Erythema intertrigo: Secondary | ICD-10-CM | POA: Diagnosis not present

## 2018-01-21 DIAGNOSIS — J069 Acute upper respiratory infection, unspecified: Secondary | ICD-10-CM

## 2018-01-21 MED ORDER — AMOXICILLIN 500 MG PO CAPS: 500.0000 mg | ORAL_CAPSULE | Freq: Three times a day (TID) | ORAL | 0 refills | Status: DC

## 2018-01-21 MED ORDER — METHYLPREDNISOLONE ACETATE 80 MG/ML IJ SUSP
80.0000 mg | Freq: Once | INTRAMUSCULAR | Status: AC
  Administered 2018-01-21: 80 mg via INTRAMUSCULAR

## 2018-01-21 MED ORDER — HYDROCODONE-HOMATROPINE 5-1.5 MG/5ML PO SYRP: 5.0000 mL | ORAL_SOLUTION | Freq: Four times a day (QID) | ORAL | 0 refills | Status: DC | PRN

## 2018-01-21 MED ORDER — KETOCONAZOLE 2 % EX CREA: 1.0000 "application " | TOPICAL_CREAM | Freq: Every day | CUTANEOUS | 5 refills | Status: DC

## 2018-01-21 NOTE — Patient Instructions (Signed)
Amoxicillin 500 mg 3 times a day for 10 days.  Nizoral cream under her breast daily for intertrigo.  Depo-Medrol 80 mg IM.  Hycodan 1 teaspoon p.o. every 8 hours as needed cough.

## 2018-01-21 NOTE — Progress Notes (Signed)
   Subjective:    Patient ID: Crystal Wallace, female    DOB: [birth year], 42 y.o.   MRN: [id number]  HPI 42 year old Black Female in today with complaint of scratchy throat, malaise fatigue and slight cough as well as left ear pain onset 3 or 4 days ago.  No documented fever.  No shaking chills.  No sputum production.  She has diabetes mellitus, morbid obesity and hypertension.  Her blood pressure is currently under good control.    Review of Systems see above-also complaining of itching and rash under left breast     Objective:   Physical Exam Skin warm and dry.  Pharynx not injected.  Left TM is full.  Right TM is okay without redness or fullness.  Neck is supple.  Chest is clear to auscultation without rales or wheezing.  Has dermatitis consistent with intertrigo under left breast     Assessment & Plan:  Acute left otitis media  Acute URI  Intertrigo-under left breast  Plan: Nizoral cream under breasts daily.  Hycodan 1 teaspoon p.o. every 8 hours as needed cough.  Amoxicillin 500 mg 3 times a day for 10 days.  Depo-Medrol 80 mg IM for congestion.  Tylenol if needed for pain.

## 2018-01-31 ENCOUNTER — Ambulatory Visit (INDEPENDENT_AMBULATORY_CARE_PROVIDER_SITE_OTHER): Payer: Managed Care, Other (non HMO) | Admitting: Family Medicine

## 2018-01-31 VITALS — BP 134/83 | HR 69 | Temp 98.9°F | Ht 64.0 in | Wt 273.0 lb

## 2018-01-31 DIAGNOSIS — Z9189 Other specified personal risk factors, not elsewhere classified: Secondary | ICD-10-CM

## 2018-01-31 DIAGNOSIS — Z6841 Body Mass Index (BMI) 40.0 and over, adult: Secondary | ICD-10-CM | POA: Diagnosis not present

## 2018-01-31 DIAGNOSIS — E559 Vitamin D deficiency, unspecified: Secondary | ICD-10-CM | POA: Diagnosis not present

## 2018-01-31 MED ORDER — VITAMIN D (ERGOCALCIFEROL) 1.25 MG (50000 UNIT) PO CAPS: 50000.0000 [IU] | ORAL_CAPSULE | ORAL | 0 refills | Status: DC

## 2018-01-31 NOTE — Progress Notes (Addendum)
Office: [phone]  /  Fax: [phone]   Date: February 05, 2018 Time Seen: 4:00pm Duration: 35 minutes Provider: Glennie Isle, Psy.D. Type of Session: Individual Therapy   HPI: Crestina was referred by Dr. Dennard Nip and seen for an initial appointment by this provider on December 20, 2017. Per the note for the initial visit with Dr. Dennard Nip on December 06, 2017, [name] reported experiencing the following: picky eater and doesn't like to eat healthier foods; skips meals frequently; frequently drinks liquids with calories; frequently makes poor food choices;frequently eats larger portions than normal; and struggles with emotional eating.  In addition,  Cassandr has been heavy most of herlife, and does not know when she started gaining weight. Hellon'sheaviest weight ever was 290pounds. During the initial appointment with this provider, [name] shared, "I don't think I am an emotional eater." She noted, "I eat when I am hungry." Since starting with the clinic, Lecia explained she has not been as hungry as she used to be. Moreover, she indicated during her initial appointment with Dr. Leafy Ro, they discussed that Buelah believes she is not an emotional eater, but there is still a possibility she may be. She shared she craves salty and crunchy foods (e.g., potato chips). Charmika explained she would crave the aforementioned when menstruating. Furthermore, she shared she would eat certain things out of habit. Additionally, she expressed worry about her ability to continue being successful with the meal plan. More specifically, she discussed birthdays earlier this year caused her to be unsuccessful with the healthy changes she was trying to implement. Also, Elson Clan described engaging in "all or nothing" thinking as she decribed various foods (e.g., cake) she cannot have at all as they are not noted on her current meal plan. She denied binging, purging, and other compensatory strategies. Philicia  denied ever being diagnosed with an eating disorder. Previously, [name] shared she tried to diet "a whole lot of times." She described "doing good" for a period of time, but then she would begin eating whatever she wanted to. Sharnae was asked to complete a questionnaire assessing various behaviors related to emotional eating. Haylei endorsed the following: overeat when you are celebrating.   Session Content: Session focused on the following treatment goal: decrease symptoms of anxiety. The session was initiated with the administration of the PHQ-9 and GAD-7, as well as a brief check-in. Valeta shared that things have been "pretty good." Since the last appointment with this provider, she indicated there were no episodes of emotional eating and attributed the aforementioned to her ear infection clearing up. Today's session focused on cognitive distortions commonly associated with emotional eating. Lorina was provided a handout and psychoeducation regarding each distortion was provided. She identified that currently she experiences the following distortions: categorizer and comparer. Prior to starting with this clinic, Aizlynn indicated engaging in distortions including the [age]-year-old and the labeler. This provider then provided psychoeducation regarding thought defusion. Karlyn was led through an exercise and she utilized the statement, "I am in trouble." She was given a handout with exercises to practice between now and the next appointment. When asked at the end of the appointment what stood out today, [name] indicated "thought defusion." Overall, Manuelita was receptive to today's appointment as evidenced by her openness to sharing, responsiveness to feedback, and willingness to engage in the thought defusion exercise.  Mental Status Examination: Shatha arrived early for the appointment. She presented as appropriately dressed and groomed. Akshitha appeared her stated age and demonstrated adequate  orientation to time,  place, person, and purpose of the appointment. She also demonstrated appropriate eye contact. No psychomotor abnormalities or behavioral peculiarities noted. Her mood was euthymic with congruent affect. Her thought processes were logical, linear, and goal-directed. No hallucinations, delusions, bizarre thinking or behavior reported or observed. Judgment, insight, and impulse control appeared to be grossly intact. There was no evidence of paraphasias (i.e., errors in speech, gross mispronunciations, and word substitutions), repetition deficits, or disturbances in volume or prosody (i.e., rhythm and intonation). There was no evidence of attention or memory impairments. Meily denied current suicidal and homicidal ideation, intent or plan.  Structured Assessment Results: The Patient Health Questionnaire-9 (PHQ-9) is a self-report measure that assesses symptoms and severity of depression over the course of the last two weeks. Dorethia obtained a score of zero. Depression screen PHQ 2/9 02/05/2018  Decreased Interest 0  Down, Depressed, Hopeless 0  PHQ - 2 Score 0  Altered sleeping 0  Tired, decreased energy 0  Change in appetite 0  Feeling bad or failure about yourself  0  Trouble concentrating 0  Moving slowly or fidgety/restless 0  Suicidal thoughts 0  PHQ-9 Score 0  Difficult doing work/chores -   The Generalized Anxiety Disorder-7 (GAD-7) is a brief self-report measure that assesses symptoms of anxiety over the course of the last two weeks. Allahna obtained a score of zero. GAD 7 : Generalized Anxiety Score 02/05/2018  Nervous, Anxious, on Edge 0  Control/stop worrying 0  Worry too much - different things 0  Trouble relaxing 0  Restless 0  Easily annoyed or irritable 0  Afraid - awful might happen 0  Total GAD 7 Score 0  Anxiety Difficulty Not difficult at all   Interventions: Haleema was administered the PHQ-9 and GAD-7 for symptom monitoring. Content from the last  session was reviewed. Throughout today's session, empathic reflections and validation were provided. Psychoeducation regarding common cognitive distortions associated with emotional eating and thought defusion was provided.  Handouts for the aforementioned were also given to [place].  She was led through a thought defusion exercise.  DSM-5 Diagnosis: 300.09 (F41.8) Other Specified Anxiety Disorder, Generalized anxiety not occurring more days than not and Emotional eating  Treatment Goal & Progress: Railee was seen for an initial appointment with this provider on December 20, 2017 during which the following treatment goal was established: decrease symptoms of anxiety. Taylia has demonstrated progress in her goal of decreasing symptoms of anxiety as evidenced by her increased awareness of cognitive distortions commonly associated with emotional eating as well as engagement in thought defusion to assist in coping. During previous appointments, Shalayne has also demonstrated awareness of hunger patterns as well as triggers for emotional eating.  Plan: Hillary continues to appear able and willing to participate as evidenced by engagement in reciprocal conversation, and asking questions for clarification as appropriate.The next appointment will be scheduled in three weeks per Malka's request due to her current work schedule. This provider was receptive to [facility] aforementioned request based on her current self-report of functioning. The next session will focus on reviewing thought defusion and introducing mindfulness.

## 2018-02-04 NOTE — Progress Notes (Signed)
Office: [phone]  /  Fax: [phone]   HPI:   Chief Complaint: OBESITY Crystal Wallace is here to discuss her progress with her obesity treatment plan. She is on the  follow the Category 3 plan and is following her eating plan approximately 98 % of the time. She states she is exercising 0 minutes 0 times per week. Crystal Wallace is currently struggling with retaining fluid today. She has been sick with otitis and is not feeling too well. She is tired of all her protein and would like more options.   Her weight is 273 lb (123.8 kg) today and has not lost weight since her last visit. She has lost 13 lbs since starting treatment with [name].  Vitamin D deficiency Crystal Wallace has a diagnosis of vitamin D deficiency. She is currently taking vit D but not yet at goal and denies nausea, vomiting or muscle weakness.   Ref. Range 12/06/2017 10:36  Vitamin D, 25-Hydroxy Latest Ref Range: 30.0 - 100.0 ng/mL 27.7 (L)    ALLERGIES: No Known Allergies  MEDICATIONS: Current Outpatient Medications on File Prior to Visit  Medication Sig Dispense Refill  . acetaminophen (TYLENOL) 500 MG tablet Take 1,000 mg by mouth at bedtime as needed for mild pain.    Marland Kitchen amLODipine (NORVASC) 5 MG tablet One po daily 90 tablet 1  . amLODipine (NORVASC) 5 MG tablet TAKE 1 TABLET(5 MG) BY MOUTH DAILY 90 tablet 0  . amoxicillin (AMOXIL) 500 MG capsule Take 1 capsule (500 mg total) by mouth 3 (three) times daily. 30 capsule 0  . blood glucose meter kit and supplies KIT Dispense based on patient and insurance preference. Use up to four times daily as directed. (FOR ICD-9 250.00, 250.01). 1 each 0  . carvedilol (COREG) 12.5 MG tablet TAKE 1 TABLET(12.5 MG) BY MOUTH TWICE DAILY WITH A MEAL 60 tablet 3  . cholecalciferol (VITAMIN D) 1000 UNITS tablet Take 1,000 Units by mouth daily.    . cyclobenzaprine (FLEXERIL) 10 MG tablet TAKE 1 TABLET(10 MG) BY MOUTH AT BEDTIME 30 tablet 0  . furosemide (LASIX) 40 MG tablet TAKE 1 TABLET(40 MG) BY  MOUTH DAILY 90 tablet 0  . glucose blood test strip Use as instructed 100 each 11  . glucose monitoring kit (FREESTYLE) monitoring kit 1 each by Does not apply route as needed for other. 1 each 0  . HYDROcodone-homatropine (HYCODAN) 5-1.5 MG/5ML syrup Take 5 mLs by mouth every 6 (six) hours as needed for cough. 120 mL 0  . ketoconazole (NIZORAL) 2 % cream Apply 1 application topically daily. 30 g 5  . levonorgestrel (MIRENA) 20 MCG/24HR IUD 1 each by Intrauterine route once.    . metFORMIN (GLUCOPHAGE-XR) 750 MG 24 hr tablet TAKE 1 TABLET(750 MG) BY MOUTH DAILY WITH BREAKFAST 30 tablet 5  . Multiple Vitamin (MULTIVITAMIN WITH MINERALS) TABS tablet Take 1 tablet by mouth daily.    Marland Kitchen olmesartan (BENICAR) 40 MG tablet Take 1 tablet (40 mg total) by mouth daily. 90 tablet 0  . Omega-3 Fatty Acids (FISH OIL) 1000 MG CAPS Take 1 capsule by mouth daily.    . potassium chloride SA (K-DUR,KLOR-CON) 20 MEQ tablet TAKE 1 TABLET(20 MEQ) BY MOUTH DAILY 30 tablet 0  . simvastatin (ZOCOR) 40 MG tablet Take 1 tablet (40 mg total) by mouth at bedtime. 90 tablet 3   No current facility-administered medications on file prior to visit.     PAST MEDICAL HISTORY: Past Medical History:  Diagnosis Date  . Diabetes ([place])   .  HTN (hypertension)   . Hx of blood clots   . Hyperlipidemia   . Lactose intolerance   . Vitamin D deficiency     PAST SURGICAL HISTORY: Past Surgical History:  Procedure Laterality Date  .  c section  1999  . adenoid removal age 73    . cervical 5 and cervical 6 neck surgery  12-07-2010  . ROBOT ASSISTED MYOMECTOMY  10/20/2011   Procedure: ROBOTIC ASSISTED MYOMECTOMY;  Surgeon: Lahoma Crocker, MD;  Location: [facility];  Service: Gynecology;  Laterality: N/A;    SOCIAL HISTORY: Social History   Tobacco Use  . Smoking status: Never Smoker  . Smokeless tobacco: Never Used  Substance Use Topics  . Alcohol use: No  . Drug use: No    FAMILY HISTORY: Family History  Problem  Relation Age of Onset  . Arthritis Mother   . Heart disease Mother   . Hyperlipidemia Mother   . Hypertension Father   . Diabetes Father   . Obesity Father     ROS: Review of Systems  Constitutional: Negative for weight loss.  Gastrointestinal: Negative for nausea and vomiting.  Musculoskeletal:       Negative for muscle weakness     PHYSICAL EXAM: Blood pressure 134/83, pulse 69, temperature 98.9 F (37.2 C), temperature source Oral, height '5\' 4"'$  (1.626 m), weight 273 lb (123.8 kg), SpO2 98 %. Body mass index is 46.86 kg/m. Physical Exam  Constitutional: She is oriented to person, place, and time. She appears well-developed and well-nourished.  HENT:  Head: Normocephalic.  Cardiovascular: Normal rate.  Pulmonary/Chest: Effort normal.  Musculoskeletal: Normal range of motion.  Neurological: She is alert and oriented to person, place, and time.  Skin: Skin is warm and dry.  Psychiatric: She has a normal mood and affect. Her behavior is normal.  Vitals reviewed.   RECENT LABS AND TESTS: BMET    Component Value Date/Time   NA 139 12/06/2017 1036   K 4.1 12/06/2017 1036   CL 100 12/06/2017 1036   CO2 19 (L) 12/06/2017 1036   GLUCOSE 116 (H) 12/06/2017 1036   GLUCOSE 114 (H) 07/10/2017 0908   BUN 11 12/06/2017 1036   CREATININE 0.56 (L) 12/06/2017 1036   CREATININE 0.65 07/10/2017 0908   CALCIUM 9.6 12/06/2017 1036   GFRNONAA 116 12/06/2017 1036   GFRNONAA 110 07/10/2017 0908   GFRAA 134 12/06/2017 1036   GFRAA 128 07/10/2017 0908   Lab Results  Component Value Date   HGBA1C 6.5 (H) 12/06/2017   HGBA1C 6.4 (H) 07/10/2017   HGBA1C 6.2 (H) 05/19/2016   HGBA1C 6.6 (H) 02/07/2016   HGBA1C 6.6 (H) 11/01/2015   Lab Results  Component Value Date   INSULIN 35.9 (H) 12/06/2017   CBC    Component Value Date/Time   WBC 11.2 (H) 12/06/2017 1036   WBC 13.1 (H) 07/10/2017 0908   RBC 4.36 12/06/2017 1036   RBC 4.52 07/10/2017 0908   HGB 13.1 12/06/2017 1036   HCT  38.9 12/06/2017 1036   PLT 392 07/10/2017 0908   MCV 89 12/06/2017 1036   MCH 30.0 12/06/2017 1036   MCH 29.4 07/10/2017 0908   MCHC 33.7 12/06/2017 1036   MCHC 34.4 07/10/2017 0908   RDW 13.7 12/06/2017 1036   LYMPHSABS 4.4 (H) 12/06/2017 1036   MONOABS 0.5 04/16/2015 0901   EOSABS 0.3 12/06/2017 1036   BASOSABS 0.1 12/06/2017 1036   Iron/TIBC/Ferritin/ %Sat    Component Value Date/Time   IRON 112 04/16/2015 0901  TIBC 303 04/16/2015 0901   IRONPCTSAT 37 04/16/2015 0901   Lipid Panel     Component Value Date/Time   CHOL 179 12/06/2017 1036   TRIG 138 12/06/2017 1036   HDL 51 12/06/2017 1036   CHOLHDL 3.8 08/07/2017 0933   VLDL 28 05/19/2016 1128   LDLCALC 100 (H) 12/06/2017 1036   LDLCALC 104 (H) 08/07/2017 0933   Hepatic Function Panel     Component Value Date/Time   PROT 6.8 12/06/2017 1036   ALBUMIN 4.4 12/06/2017 1036   AST 13 12/06/2017 1036   ALT 17 12/06/2017 1036   ALKPHOS 83 12/06/2017 1036   BILITOT 0.7 12/06/2017 1036   BILIDIR 0.2 08/07/2017 0933   IBILI 0.9 08/07/2017 0933      Component Value Date/Time   TSH 1.480 12/06/2017 1036   TSH 1.63 07/10/2017 0908   TSH 0.986 04/16/2015 0901    Ref. Range 12/06/2017 10:36  Vitamin D, 25-Hydroxy Latest Ref Range: 30.0 - 100.0 ng/mL 27.7 (L)    ASSESSMENT AND PLAN: Vitamin D deficiency - Plan: Vitamin D, Ergocalciferol, (DRISDOL) 50000 units CAPS capsule  At risk for osteoporosis  Class 3 severe obesity with serious comorbidity and body mass index (BMI) of 45.0 to 49.9 in adult, unspecified obesity type ([place])  PLAN: Vitamin D Deficiency Mertha was informed that low vitamin D levels contributes to fatigue and are associated with obesity, breast, and colon cancer. She agrees to continue to take prescription Vit D '@50'$ ,000 IU every week #4 with no refills and will follow up for routine testing of vitamin D, at least 2-3 times per year. She was informed of the risk of over-replacement of vitamin D and  agrees to not increase her dose unless she discusses this with [name] first. Agrees to follow up with our clinic as directed.   Obesity Saudia is currently in the action stage of change. As such, her goal is to continue with weight loss efforts She has agreed to follow the Category 3 plan with 2 oz of protein substitution.  Azyiah has been instructed to work up to a goal of 150 minutes of combined cardio and strengthening exercise per week for weight loss and overall health benefits. We discussed the following Behavioral Modification Strategies today: increasing lean protein intake and decreasing simple carbohydrates    Abiageal has agreed to follow up with our clinic in 2-3 weeks. She was informed of the importance of frequent follow up visits to maximize her success with intensive lifestyle modifications for her multiple health conditions.   OBESITY BEHAVIORAL INTERVENTION VISIT  Today's visit was # 4   Starting weight: 286 lb Starting date: 12/06/17 Today's weight : 273 lb Today's date:01/31/18 Total lbs lost to date: 13 lb    ASK: We discussed the diagnosis of obesity with [name] Gave today and [name] agreed to give [name] permission to discuss obesity behavioral modification therapy today.  ASSESS: Tinnie has the diagnosis of obesity and her BMI today is 46.84 Holly is in the action stage of change   ADVISE: Zyonna was educated on the multiple health risks of obesity as well as the benefit of weight loss to improve her health. She was advised of the need for long term treatment and the importance of lifestyle modifications to improve her current health and to decrease her risk of future health problems.  AGREE: Multiple dietary modification options and treatment options were discussed and  Domnique agreed to follow the recommendations documented in the above note.  ARRANGE:  [place] was educated on the importance of frequent visits to treat obesity as outlined per CMS and  USPSTF guidelines and agreed to schedule her next follow up appointment today.  I, Renee Ramus, am acting as transcriptionist for Dennard Nip, MD  I have reviewed the above documentation for accuracy and completeness, and I agree with the above. -Dennard Nip, MD

## 2018-02-05 ENCOUNTER — Ambulatory Visit (INDEPENDENT_AMBULATORY_CARE_PROVIDER_SITE_OTHER): Payer: Managed Care, Other (non HMO) | Admitting: Psychology

## 2018-02-05 DIAGNOSIS — F418 Other specified anxiety disorders: Secondary | ICD-10-CM

## 2018-02-19 ENCOUNTER — Ambulatory Visit (INDEPENDENT_AMBULATORY_CARE_PROVIDER_SITE_OTHER): Payer: Managed Care, Other (non HMO) | Admitting: Family Medicine

## 2018-02-19 VITALS — BP 130/80 | HR 68 | Temp 98.7°F | Ht 64.0 in | Wt 265.0 lb

## 2018-02-19 DIAGNOSIS — Z9189 Other specified personal risk factors, not elsewhere classified: Secondary | ICD-10-CM

## 2018-02-19 DIAGNOSIS — Z6841 Body Mass Index (BMI) 40.0 and over, adult: Secondary | ICD-10-CM

## 2018-02-19 DIAGNOSIS — E559 Vitamin D deficiency, unspecified: Secondary | ICD-10-CM

## 2018-02-19 MED ORDER — VITAMIN D (ERGOCALCIFEROL) 1.25 MG (50000 UNIT) PO CAPS: 50000.0000 [IU] | ORAL_CAPSULE | ORAL | 0 refills | Status: DC

## 2018-02-20 NOTE — Progress Notes (Signed)
Office: [phone]  /  Fax: [phone]   HPI:   Chief Complaint: OBESITY Crystal Wallace is here to discuss her progress with her obesity treatment plan. She is on  the Category 3 plan with 2 ounces of protein substitution and is following her eating plan approximately 90 % of the time. She states she is exercising 0 minutes 0 times per week. Crystal Wallace continues to do very well with weight loss on her Category 3 plan. She is getting bored with her breakfast options and she would like more choices. Her weight is 265 lb (120.2 kg) today and has had a weight loss of 8 pounds over a period of 2 weeks since her last visit. She has lost 21 lbs since starting treatment with [name].  Vitamin D deficiency Crystal Wallace has a diagnosis of vitamin D deficiency. Crystal Wallace is stable on vit D, but she is not yet at goal. She denies nausea, vomiting or muscle weakness.  At risk for osteopenia and osteoporosis Crystal Wallace is at higher risk of osteopenia and osteoporosis due to vitamin D deficiency.   ALLERGIES: No Known Allergies  MEDICATIONS: Current Outpatient Medications on File Prior to Visit  Medication Sig Dispense Refill  . acetaminophen (TYLENOL) 500 MG tablet Take 1,000 mg by mouth at bedtime as needed for mild pain.    Marland Kitchen amLODipine (NORVASC) 5 MG tablet One po daily 90 tablet 1  . amLODipine (NORVASC) 5 MG tablet TAKE 1 TABLET(5 MG) BY MOUTH DAILY 90 tablet 0  . amoxicillin (AMOXIL) 500 MG capsule Take 1 capsule (500 mg total) by mouth 3 (three) times daily. 30 capsule 0  . blood glucose meter kit and supplies KIT Dispense based on patient and insurance preference. Use up to four times daily as directed. (FOR ICD-9 250.00, 250.01). 1 each 0  . carvedilol (COREG) 12.5 MG tablet TAKE 1 TABLET(12.5 MG) BY MOUTH TWICE DAILY WITH A MEAL 60 tablet 3  . cholecalciferol (VITAMIN D) 1000 UNITS tablet Take 1,000 Units by mouth daily.    . cyclobenzaprine (FLEXERIL) 10 MG tablet TAKE 1 TABLET(10 MG) BY MOUTH AT BEDTIME 30  tablet 0  . furosemide (LASIX) 40 MG tablet TAKE 1 TABLET(40 MG) BY MOUTH DAILY 90 tablet 0  . glucose blood test strip Use as instructed 100 each 11  . glucose monitoring kit (FREESTYLE) monitoring kit 1 each by Does not apply route as needed for other. 1 each 0  . HYDROcodone-homatropine (HYCODAN) 5-1.5 MG/5ML syrup Take 5 mLs by mouth every 6 (six) hours as needed for cough. 120 mL 0  . ketoconazole (NIZORAL) 2 % cream Apply 1 application topically daily. 30 g 5  . levonorgestrel (MIRENA) 20 MCG/24HR IUD 1 each by Intrauterine route once.    . metFORMIN (GLUCOPHAGE-XR) 750 MG 24 hr tablet TAKE 1 TABLET(750 MG) BY MOUTH DAILY WITH BREAKFAST 30 tablet 5  . Multiple Vitamin (MULTIVITAMIN WITH MINERALS) TABS tablet Take 1 tablet by mouth daily.    Marland Kitchen olmesartan (BENICAR) 40 MG tablet Take 1 tablet (40 mg total) by mouth daily. 90 tablet 0  . Omega-3 Fatty Acids (FISH OIL) 1000 MG CAPS Take 1 capsule by mouth daily.    . potassium chloride SA (K-DUR,KLOR-CON) 20 MEQ tablet TAKE 1 TABLET(20 MEQ) BY MOUTH DAILY 30 tablet 0  . simvastatin (ZOCOR) 40 MG tablet Take 1 tablet (40 mg total) by mouth at bedtime. 90 tablet 3   No current facility-administered medications on file prior to visit.     PAST MEDICAL HISTORY:  Past Medical History:  Diagnosis Date  . Diabetes ([place])   . HTN (hypertension)   . Hx of blood clots   . Hyperlipidemia   . Lactose intolerance   . Vitamin D deficiency     PAST SURGICAL HISTORY: Past Surgical History:  Procedure Laterality Date  .  c section  1999  . adenoid removal age 60    . cervical 5 and cervical 6 neck surgery  12-07-2010  . ROBOT ASSISTED MYOMECTOMY  10/20/2011   Procedure: ROBOTIC ASSISTED MYOMECTOMY;  Surgeon: Lahoma Crocker, MD;  Location: [facility];  Service: Gynecology;  Laterality: N/A;    SOCIAL HISTORY: Social History   Tobacco Use  . Smoking status: Never Smoker  . Smokeless tobacco: Never Used  Substance Use Topics  . Alcohol use: No    . Drug use: No    FAMILY HISTORY: Family History  Problem Relation Age of Onset  . Arthritis Mother   . Heart disease Mother   . Hyperlipidemia Mother   . Hypertension Father   . Diabetes Father   . Obesity Father     ROS: Review of Systems  Constitutional: Positive for weight loss.  Gastrointestinal: Negative for nausea and vomiting.  Musculoskeletal:       Negative for muscle weakness    PHYSICAL EXAM: Blood pressure 130/80, pulse 68, temperature 98.7 F (37.1 C), temperature source Oral, height 5' 4"  (1.626 m), weight 265 lb (120.2 kg), SpO2 94 %. Body mass index is 45.49 kg/m. Physical Exam  Constitutional: She is oriented to person, place, and time. She appears well-developed and well-nourished.  Cardiovascular: Normal rate.  Pulmonary/Chest: Effort normal.  Musculoskeletal: Normal range of motion.  Neurological: She is oriented to person, place, and time.  Skin: Skin is warm and dry.  Psychiatric: She has a normal mood and affect. Her behavior is normal.  Vitals reviewed.   RECENT LABS AND TESTS: BMET    Component Value Date/Time   NA 139 12/06/2017 1036   K 4.1 12/06/2017 1036   CL 100 12/06/2017 1036   CO2 19 (L) 12/06/2017 1036   GLUCOSE 116 (H) 12/06/2017 1036   GLUCOSE 114 (H) 07/10/2017 0908   BUN 11 12/06/2017 1036   CREATININE 0.56 (L) 12/06/2017 1036   CREATININE 0.65 07/10/2017 0908   CALCIUM 9.6 12/06/2017 1036   GFRNONAA 116 12/06/2017 1036   GFRNONAA 110 07/10/2017 0908   GFRAA 134 12/06/2017 1036   GFRAA 128 07/10/2017 0908   Lab Results  Component Value Date   HGBA1C 6.5 (H) 12/06/2017   HGBA1C 6.4 (H) 07/10/2017   HGBA1C 6.2 (H) 05/19/2016   HGBA1C 6.6 (H) 02/07/2016   HGBA1C 6.6 (H) 11/01/2015   Lab Results  Component Value Date   INSULIN 35.9 (H) 12/06/2017   CBC    Component Value Date/Time   WBC 11.2 (H) 12/06/2017 1036   WBC 13.1 (H) 07/10/2017 0908   RBC 4.36 12/06/2017 1036   RBC 4.52 07/10/2017 0908   HGB 13.1  12/06/2017 1036   HCT 38.9 12/06/2017 1036   PLT 392 07/10/2017 0908   MCV 89 12/06/2017 1036   MCH 30.0 12/06/2017 1036   MCH 29.4 07/10/2017 0908   MCHC 33.7 12/06/2017 1036   MCHC 34.4 07/10/2017 0908   RDW 13.7 12/06/2017 1036   LYMPHSABS 4.4 (H) 12/06/2017 1036   MONOABS 0.5 04/16/2015 0901   EOSABS 0.3 12/06/2017 1036   BASOSABS 0.1 12/06/2017 1036   Iron/TIBC/Ferritin/ %Sat    Component Value Date/Time  IRON 112 04/16/2015 0901   TIBC 303 04/16/2015 0901   IRONPCTSAT 37 04/16/2015 0901   Lipid Panel     Component Value Date/Time   CHOL 179 12/06/2017 1036   TRIG 138 12/06/2017 1036   HDL 51 12/06/2017 1036   CHOLHDL 3.8 08/07/2017 0933   VLDL 28 05/19/2016 1128   LDLCALC 100 (H) 12/06/2017 1036   LDLCALC 104 (H) 08/07/2017 0933   Hepatic Function Panel     Component Value Date/Time   PROT 6.8 12/06/2017 1036   ALBUMIN 4.4 12/06/2017 1036   AST 13 12/06/2017 1036   ALT 17 12/06/2017 1036   ALKPHOS 83 12/06/2017 1036   BILITOT 0.7 12/06/2017 1036   BILIDIR 0.2 08/07/2017 0933   IBILI 0.9 08/07/2017 0933      Component Value Date/Time   TSH 1.480 12/06/2017 1036   TSH 1.63 07/10/2017 0908   TSH 0.986 04/16/2015 0901   Results for JANISE, GORA (MRN [id number]) as of 02/20/2018 09:50  Ref. Range 12/06/2017 10:36  Vitamin D, 25-Hydroxy Latest Ref Range: 30.0 - 100.0 ng/mL 27.7 (L)   ASSESSMENT AND PLAN: Vitamin D deficiency - Plan: Vitamin D, Ergocalciferol, (DRISDOL) 50000 units CAPS capsule  At risk for osteoporosis  Class 3 severe obesity with serious comorbidity and body mass index (BMI) of 45.0 to 49.9 in adult, unspecified obesity type ([place])  PLAN:  Vitamin D Deficiency Crystal Wallace was informed that low vitamin D levels contributes to fatigue and are associated with obesity, breast, and colon cancer. She agrees to continue to take prescription Vit D @50 ,000 IU every week #4 with no refills and will follow up for routine testing of vitamin D, at  least 2-3 times per year. She was informed of the risk of over-replacement of vitamin D and agrees to not increase her dose unless she discusses this with [name] first. Crystal Wallace agrees to follow up as directed.  At risk for osteopenia and osteoporosis Crystal Wallace was given extended  (15 minutes) osteoporosis prevention counseling today. Crystal Wallace is at risk for osteopenia and osteoporosis due to her vitamin D deficiency. She was encouraged to take her vitamin D and follow her higher calcium diet and increase strengthening exercise to help strengthen her bones and decrease her risk of osteopenia and osteoporosis.  Obesity Crystal Wallace is currently in the action stage of change. As such, her goal is to continue with weight loss efforts She has agreed to follow the Category 3 plan with breakfast options Crystal Wallace has been instructed to work up to a goal of 150 minutes of combined cardio and strengthening exercise per week for weight loss and overall health benefits. We discussed the following Behavioral Modification Strategies today: increasing lean protein intake, decreasing simple carbohydrates  and work on meal planning and easy cooking plans  Crystal Wallace has agreed to follow up with our clinic in 2 weeks. She was informed of the importance of frequent follow up visits to maximize her success with intensive lifestyle modifications for her multiple health conditions.   OBESITY BEHAVIORAL INTERVENTION VISIT  Today's visit was # 5   Starting weight: 286 lbs Starting date: 12/06/17 Today's weight : 265 lbs Today's date: 02/19/2018 Total lbs lost to date: 21   ASK: We discussed the diagnosis of obesity with Crystal Wallace today and Crystal Wallace agreed to give [name] permission to discuss obesity behavioral modification therapy today.  ASSESS: Crystal Wallace has the diagnosis of obesity and her BMI today is 45.46 Crystal Wallace is in the action stage of change  ADVISE: Crystal Wallace was educated on the multiple health risks of obesity as  well as the benefit of weight loss to improve her health. She was advised of the need for long term treatment and the importance of lifestyle modifications to improve her current health and to decrease her risk of future health problems.  AGREE: Multiple dietary modification options and treatment options were discussed and  Crystal Wallace agreed to follow the recommendations documented in the above note.  ARRANGE: Crystal Wallace was educated on the importance of frequent visits to treat obesity as outlined per CMS and USPSTF guidelines and agreed to schedule her next follow up appointment today.  I, Doreene Nest, am acting as transcriptionist for Dennard Nip, MD  I have reviewed the above documentation for accuracy and completeness, and I agree with the above. -Dennard Nip, MD

## 2018-02-26 ENCOUNTER — Ambulatory Visit (INDEPENDENT_AMBULATORY_CARE_PROVIDER_SITE_OTHER): Payer: Managed Care, Other (non HMO) | Admitting: Psychology

## 2018-02-26 ENCOUNTER — Encounter (INDEPENDENT_AMBULATORY_CARE_PROVIDER_SITE_OTHER): Payer: Self-pay | Admitting: Family Medicine

## 2018-02-26 DIAGNOSIS — F418 Other specified anxiety disorders: Secondary | ICD-10-CM | POA: Diagnosis not present

## 2018-02-26 NOTE — Progress Notes (Signed)
Office: [phone]  /  Fax: [phone]   Date: February 26, 2018  Time Seen: 4:30pm Duration: 30 minutes Provider: Glennie Isle, Psy.D. Type of Session: Individual Therapy   HPI: [place] referred by Dr. Dennard Nip and seen for an initial appointment by this provider on December 20, 2017. Per the note for the initial visit withDr. Desmond Dike December 06, 2017, [name] reported experiencing the following:picky eater and doesn't like to eat healthier foods;skips meals frequently;frequentlydrinksliquids with calories;frequently makes poor food choices;frequently eats larger portions than normal; andstruggles with emotional eating. In addition,  Law[place] been heavy most of herlife, and does not know when she started gaining weight. Nikoleta'sheaviest weight ever was 290pounds.During the initial appointment with this provider, [name] shared, "I don't think I am an emotional eater." She noted, "I eat when I am hungry." Since starting with the clinic, Ngina explained she has not been as hungry as she used to be. Moreover, she indicated during her initial appointment with Dr. Leafy Ro, they discussed that Allicia believes she is not an emotional eater, but there is still a possibility she may be. She shared she craves salty and crunchy foods (e.g., potato chips). Divya explained she would crave the aforementioned when menstruating. Furthermore, she shared she would eat certain things out of habit. Additionally, she expressed worry about her ability to continue being successful with the meal plan. More specifically, she discussed birthdays earlier this year caused her to be unsuccessful with the healthy changes she was trying to implement.Also,Lawnda described engaging in "all or nothing" thinking as she decribed various foods (e.g., cake) she cannot have at all as they are notnoted on her currentmeal plan.She denied binging, purging,andother compensatory strategies. Megha  denied ever being diagnosed with an eating disorder. Previously, [name] shared she tried to diet "a whole lot of times." She described "doing good" for a period of time, but then she would begin eating whatever she wanted to.[place] asked to complete a questionnaire assessing various behaviors related to emotional eating. Lawandaendorsed the following: overeat when you are celebrating.  Session Content: Session focused on the following treatment goal: decrease symptoms of anxiety. The session was initiated with the administration of the PHQ-9 and GAD-7, as well as a brief check-in. Merrianne reported ongoing work stressors and indicated she is looking for a new job. Regarding eating, she indicated it is going "pretty good."  Session then focused on discussing and reviewing thought defusion. Kyler reported practicing it on a few occasions, but indicated that it did not work sometimes. As such, this provider explored this further. In addition, [name] discussed a recent conversation that resulted in an increase in irritability. This provider Bama processed thoughts and feelings associated with that conversation and Tania was led through a brief thought defusion exercise. Moreover, this provider introduced mindfulness to [place]. Psychoeducation regarding mindfulness was provided and she was led through an exercise involving her senses. A handout on mindfulness was also given to [place]. She appeared receptive to practicing mindfulness between now and the next appointment as evidenced by her agreeing to practice one of the exercises on the handout as soon as she left this provider's office. Today's appointment concluded with a brief discussion regarding termination planning, including a referral for continuing therapeutic services. Overall, Tierria was receptive to today's appointment as evidenced by her openness to sharing, responsiveness to feedback, and engagement in the mindfulness and thought  defusion exercises.  Mental Status Examination: Vasilia arrived on time for the appointment. She presented as appropriately dressed and groomed.  Lisvet appeared her stated age and demonstrated adequate orientation to time, place, person, and purpose of the appointment. She also demonstrated appropriate eye contact. No psychomotor abnormalities or behavioral peculiarities noted. Her mood was euthymic with congruent affect. Her thought processes were logical, linear, and goal-directed. No hallucinations, delusions, bizarre thinking or behavior reported or observed. Judgment, insight, and impulse control appeared to be grossly intact. There was no evidence of paraphasias (i.e., errors in speech, gross mispronunciations, and word substitutions), repetition deficits, or disturbances in volume or prosody (i.e., rhythm and intonation). There was no evidence of attention or memory impairments. Quantina denied current suicidal and homicidal ideation, intent or plan.  Structured Assessment Results: The Patient Health Questionnaire-9 (PHQ-9) is a self-report measure that assesses symptoms and severity of depression over the course of the last two weeks. Gabrielle obtained a score of zero. Depression screen [facility] 2/9 02/26/2018  Decreased Interest 0  Down, Depressed, Hopeless 0  PHQ - 2 Score 0  Altered sleeping 0  Tired, decreased energy 0  Change in appetite 0  Feeling bad or failure about yourself  0  Trouble concentrating 0  Moving slowly or fidgety/restless 0  Suicidal thoughts 0  PHQ-9 Score 0  Difficult doing work/chores -   The Generalized Anxiety Disorder-7 (GAD-7) is a brief self-report measure that assesses symptoms of anxiety over the course of the last two weeks. Mckynzie obtained a score of one suggesting minimal anxiety. GAD 7 : Generalized Anxiety Score 02/26/2018  Nervous, Anxious, on Edge 0  Control/stop worrying 0  Worry too much - different things 0  Trouble relaxing 0  Restless 0    Easily annoyed or irritable 1  Afraid - awful might happen 0  Total GAD 7 Score 1  Anxiety Difficulty Not difficult at all   Interventions: Lilli was administered the PHQ-9 and GAD-7 for symptom monitoring. Content from the last session was reviewed. Throughout today's session, empathic reflections and validation were provided. This provider and Keylen processed thoughts and feelings associated with a recent conversation. Thought defusion was reviewed and Jaylean was led through an exercise. In addition, psychoeducation regarding mindfulness was provided and Pearson was led through an exercise.  DSM-5 Diagnosis: 300.09 (F41.8) Other Specified Anxiety Disorder, Generalized anxiety not occurring more days than not and Emotional eating  Treatment Goal & Progress: Emmelina was seen for an initial appointment with this provider on December 20, 2017 during which the following treatment goal was established: decrease symptoms of anxiety. Ameerah has demonstrated progress in her goal of decreasing symptoms of anxiety as evidenced by her increased awareness of cognitive distortions commonly associated with emotional eating and she continues to demonstrate willingness to practice thought defusion to assist in coping. She has also demonstrated awareness of hunger patterns as well as triggers for emotional eating. During today's appointment, Madyson was introduced to mindfulness.  Plan: Damaria continues to appear able and willing to participate as evidenced by engagement in reciprocal conversation, and asking questions for clarification as appropriate. The next appointment will be scheduled in two weeks. The next session will focus on mindfulness further, discussing termination further, and working towards the established treatment goal.

## 2018-03-01 ENCOUNTER — Encounter: Payer: Self-pay | Admitting: Cardiology

## 2018-03-01 ENCOUNTER — Ambulatory Visit (INDEPENDENT_AMBULATORY_CARE_PROVIDER_SITE_OTHER): Payer: Managed Care, Other (non HMO) | Admitting: Cardiology

## 2018-03-01 VITALS — BP 142/98 | HR 80 | Ht 64.0 in | Wt 274.8 lb

## 2018-03-01 DIAGNOSIS — E782 Mixed hyperlipidemia: Secondary | ICD-10-CM

## 2018-03-01 DIAGNOSIS — Z7189 Other specified counseling: Secondary | ICD-10-CM | POA: Diagnosis not present

## 2018-03-01 DIAGNOSIS — I10 Essential (primary) hypertension: Secondary | ICD-10-CM | POA: Diagnosis not present

## 2018-03-01 NOTE — Patient Instructions (Signed)

## 2018-03-01 NOTE — Progress Notes (Signed)
Cardiology Office Note:    Date:  03/01/2018   ID:  Crystal Wallace, DOB 07-05-1975, MRN [id number]  PCP:  Elby Showers, MD  Cardiologist:  Buford Dresser, MD   Referring MD: Elby Showers, MD   CC: follow up of hypertension  History of Present Illness:    Crystal Wallace is a 42 y.o. female with a hx of hypertension, type II diabetes (dx 2014), hyperlipidemia and morbid obesity who is seen in follow up for management of resistant hypertension.  First seen by Dr. Renold Genta for hypertension in 12/2011 at the age of 59, but notes that initial diagnosis was in 1999 after the birth of her daughter. On amlodipine initially. Since that time, has been on amlodipine, HCTZ, telmisartan->losartan-->irbesartan due to insurance, HCTZ-->furosemide, and carvedilol. At her initial consult with me on 11/12/17, she was on amlodipine 5 mg daily, carvedilol 12.5 mg twice daily, furosemide 40 mg daily, benicar 40 mg daily, potassium 20 mEq daily. On simvastatin 40 mg daily, cannot increased amlodipine while on this dose. She was seen in HTN clinic on 12/28/17 and was found to be at goal on amlodipine 5 mg, carvedilol 12.5 mg daily, furosemide 40 mg daily, and olmesartan 40 mg (had previously had issues affording ARB). Aldosterone/PRA did not suggest primary aldosteronism. Does not drink caffeine, no tobacco.   Today: feeling well. Doesn't measure BP at home but does measure at healthy weight and wellness clinic. Has been running 130s/80s. Hasn't taken yet this AM. Overall doing well, losing weight with a diet plan (not showing on our scales, but down 22 lbs on the wellness center scales). Not yet exercising. Tolerating medications well.   Denies vision changes, chest pain, shortness of breath, edema, PND, orthopnea, syncope. Rare headaches.   Past Medical History:  Diagnosis Date  . Diabetes ([place])   . HTN (hypertension)   . Hx of blood clots   . Hyperlipidemia   . Lactose intolerance   . Vitamin D  deficiency     Past Surgical History:  Procedure Laterality Date  .  c section  1999  . adenoid removal age 69    . cervical 5 and cervical 6 neck surgery  12-07-2010  . ROBOT ASSISTED MYOMECTOMY  10/20/2011   Procedure: ROBOTIC ASSISTED MYOMECTOMY;  Surgeon: Lahoma Crocker, MD;  Location: [facility];  Service: Gynecology;  Laterality: N/A;    Current Medications: Current Meds  Medication Sig  . acetaminophen (TYLENOL) 500 MG tablet Take 1,000 mg by mouth at bedtime as needed for mild pain.  Marland Kitchen amLODipine (NORVASC) 5 MG tablet One po daily  . amLODipine (NORVASC) 5 MG tablet TAKE 1 TABLET(5 MG) BY MOUTH DAILY  . amoxicillin (AMOXIL) 500 MG capsule Take 1 capsule (500 mg total) by mouth 3 (three) times daily.  . blood glucose meter kit and supplies KIT Dispense based on patient and insurance preference. Use up to four times daily as directed. (FOR ICD-9 250.00, 250.01).  . carvedilol (COREG) 12.5 MG tablet TAKE 1 TABLET(12.5 MG) BY MOUTH TWICE DAILY WITH A MEAL  . cholecalciferol (VITAMIN D) 1000 UNITS tablet Take 1,000 Units by mouth daily.  . cyclobenzaprine (FLEXERIL) 10 MG tablet TAKE 1 TABLET(10 MG) BY MOUTH AT BEDTIME  . furosemide (LASIX) 40 MG tablet TAKE 1 TABLET(40 MG) BY MOUTH DAILY  . glucose blood test strip Use as instructed  . glucose monitoring kit (FREESTYLE) monitoring kit 1 each by Does not apply route as needed for other.  . HYDROcodone-homatropine (HYCODAN)  5-1.5 MG/5ML syrup Take 5 mLs by mouth every 6 (six) hours as needed for cough.  Marland Kitchen ketoconazole (NIZORAL) 2 % cream Apply 1 application topically daily.  Marland Kitchen levonorgestrel (MIRENA) 20 MCG/24HR IUD 1 each by Intrauterine route once.  . metFORMIN (GLUCOPHAGE-XR) 750 MG 24 hr tablet TAKE 1 TABLET(750 MG) BY MOUTH DAILY WITH BREAKFAST  . Multiple Vitamin (MULTIVITAMIN WITH MINERALS) TABS tablet Take 1 tablet by mouth daily.  Marland Kitchen olmesartan (BENICAR) 40 MG tablet Take 1 tablet (40 mg total) by mouth daily.  . Omega-3 Fatty  Acids (FISH OIL) 1000 MG CAPS Take 1 capsule by mouth daily.  . potassium chloride SA (K-DUR,KLOR-CON) 20 MEQ tablet TAKE 1 TABLET(20 MEQ) BY MOUTH DAILY  . simvastatin (ZOCOR) 40 MG tablet Take 1 tablet (40 mg total) by mouth at bedtime.  . Vitamin D, Ergocalciferol, (DRISDOL) 50000 units CAPS capsule Take 1 capsule (50,000 Units total) by mouth every 7 (seven) days.   Current Outpatient Medications on File Prior to Visit  Medication Sig  . acetaminophen (TYLENOL) 500 MG tablet Take 1,000 mg by mouth at bedtime as needed for mild pain.  Marland Kitchen amLODipine (NORVASC) 5 MG tablet One po daily  . amLODipine (NORVASC) 5 MG tablet TAKE 1 TABLET(5 MG) BY MOUTH DAILY  . amoxicillin (AMOXIL) 500 MG capsule Take 1 capsule (500 mg total) by mouth 3 (three) times daily.  . blood glucose meter kit and supplies KIT Dispense based on patient and insurance preference. Use up to four times daily as directed. (FOR ICD-9 250.00, 250.01).  . carvedilol (COREG) 12.5 MG tablet TAKE 1 TABLET(12.5 MG) BY MOUTH TWICE DAILY WITH A MEAL  . cholecalciferol (VITAMIN D) 1000 UNITS tablet Take 1,000 Units by mouth daily.  . cyclobenzaprine (FLEXERIL) 10 MG tablet TAKE 1 TABLET(10 MG) BY MOUTH AT BEDTIME  . furosemide (LASIX) 40 MG tablet TAKE 1 TABLET(40 MG) BY MOUTH DAILY  . glucose blood test strip Use as instructed  . glucose monitoring kit (FREESTYLE) monitoring kit 1 each by Does not apply route as needed for other.  . HYDROcodone-homatropine (HYCODAN) 5-1.5 MG/5ML syrup Take 5 mLs by mouth every 6 (six) hours as needed for cough.  Marland Kitchen ketoconazole (NIZORAL) 2 % cream Apply 1 application topically daily.  Marland Kitchen levonorgestrel (MIRENA) 20 MCG/24HR IUD 1 each by Intrauterine route once.  . metFORMIN (GLUCOPHAGE-XR) 750 MG 24 hr tablet TAKE 1 TABLET(750 MG) BY MOUTH DAILY WITH BREAKFAST  . Multiple Vitamin (MULTIVITAMIN WITH MINERALS) TABS tablet Take 1 tablet by mouth daily.  Marland Kitchen olmesartan (BENICAR) 40 MG tablet Take 1 tablet (40 mg  total) by mouth daily.  . Omega-3 Fatty Acids (FISH OIL) 1000 MG CAPS Take 1 capsule by mouth daily.  . potassium chloride SA (K-DUR,KLOR-CON) 20 MEQ tablet TAKE 1 TABLET(20 MEQ) BY MOUTH DAILY  . simvastatin (ZOCOR) 40 MG tablet Take 1 tablet (40 mg total) by mouth at bedtime.  . Vitamin D, Ergocalciferol, (DRISDOL) 50000 units CAPS capsule Take 1 capsule (50,000 Units total) by mouth every 7 (seven) days.   No current facility-administered medications on file prior to visit.      Allergies:   Patient has no known allergies.   Social History   Socioeconomic History  . Marital status: Single    Spouse name: Not on file  . Number of children: Not on file  . Years of education: Not on file  . Highest education level: Not on file  Occupational History  . Occupation: Diplomatic Services operational officer  Social Needs  .  Financial resource strain: Not on file  . Food insecurity:    Worry: Not on file    Inability: Not on file  . Transportation needs:    Medical: Not on file    Non-medical: Not on file  Tobacco Use  . Smoking status: Never Smoker  . Smokeless tobacco: Never Used  Substance and Sexual Activity  . Alcohol use: No  . Drug use: No  . Sexual activity: Not on file  Lifestyle  . Physical activity:    Days per week: Not on file    Minutes per session: Not on file  . Stress: Not on file  Relationships  . Social connections:    Talks on phone: Not on file    Gets together: Not on file    Attends religious service: Not on file    Active member of club or organization: Not on file    Attends meetings of clubs or organizations: Not on file    Relationship status: Not on file  Other Topics Concern  . Not on file  Social History Narrative  . Not on file     Family History: The patient's family history includes Arthritis in her mother; Diabetes in her father; Heart disease in her mother; Hyperlipidemia in her mother; Hypertension in her father; Obesity in her father.  father has high  blood pressure, not sure how well controlled it is. Had CABG about 17 years ago. On a blood thinner. [name] with history of MI. [name] has history of MI in her 31s. Brother has possible glaucoma but otherwise health, sister is generally healthy (hysterectome at age 41).  ROS:   Please see the history of present illness.  Additional pertinent ROS: Review of Systems  Constitutional: Positive for weight loss. Negative for chills and fever.  HENT: Negative for ear pain and hearing loss.   Eyes: Negative for double vision and pain.  Respiratory: Negative for shortness of breath and wheezing.   Cardiovascular: Negative for chest pain, palpitations, orthopnea, claudication, leg swelling and PND.  Gastrointestinal: Negative for abdominal pain, blood in stool and melena.  Genitourinary: Negative for dysuria and hematuria.  Musculoskeletal: Negative for falls.  Neurological: Positive for headaches. Negative for focal weakness.  Endo/Heme/Allergies: Does not bruise/bleed easily.   EKGs/Labs/Other Studies Reviewed:    The following studies were reviewed today: EKG 2011/07/28  EKG:  The ekg ordered previously demonstrates normal sinus rhythm  Recent Labs: 07/10/2017: Platelets 392 12/06/2017: ALT 17; BUN 11; Creatinine, Ser 0.56; Hemoglobin 13.1; Potassium 4.1; Sodium 139; TSH 1.480  Recent Lipid Panel    Component Value Date/Time   CHOL 179 12/06/2017 1036   TRIG 138 12/06/2017 1036   HDL 51 12/06/2017 1036   CHOLHDL 3.8 08/07/2017 0933   VLDL 28 05/19/2016 1128   LDLCALC 100 (H) 12/06/2017 1036   LDLCALC 104 (H) 08/07/2017 0933    Physical Exam:    VS:  BP (!) 142/98   Pulse 80   Ht '5\' 4"'$  (1.626 m)   Wt 274 lb 12.8 oz (124.6 kg)   BMI 47.17 kg/m     Wt Readings from Last 3 Encounters:  03/01/18 274 lb 12.8 oz (124.6 kg)  02/19/18 265 lb (120.2 kg)  01/31/18 273 lb (123.8 kg)  per the scale at the wellness clinic, has lost 22 lbs.   GEN: Well nourished, well developed in no acute  distress HEENT: Normal. Hirsutism over chin and neck NECK: No JVD; No carotid bruits. No appreciable acanthosis  nigracans LYMPHATICS: No lymphadenopathy CARDIAC: regular rhythm, normal S1 and S2, no murmurs, rubs, gallops RESPIRATORY:  Clear to auscultation without rales, wheezing or rhonchi  ABDOMEN: Soft, non-tender, non-distended. Did not appreciate any bruits. MUSCULOSKELETAL:  No edema; No deformity  SKIN: Warm and dry NEUROLOGIC:  Alert and oriented x 3 PSYCHIATRIC:  Normal affect   ASSESSMENT:    1. Resistant hypertension   2. Mixed hyperlipidemia   3. Morbid obesity ([place])   4. Counseling on health promotion and disease prevention    PLAN:    1. Resistant hypertension: On 4 drugs: olmesartan, carvedilol, furosemide, and amlodipine. Given diabetes, BP goal <130/80 -PRA/PRC and renal panel do not suggest hyperaldosteronism -recent good control at PharmD visit, and reports being at goal at her wellness visits. Has not taken yet this AM, therefore will not adjust medications today despite BP over goal -she has a BP cuff at home, instructed on how to take measurements and given a BP log  2. Hyperlipidemia: on simvastatin 40 mg and tolerating. Given risk of interaction with amlodipine, would change this prior to making adjustments to amlodipine dosing. No history of personal ASCVD, but does have diabetes. LDL goal <100 and/or moderate intensity statin -last LDL 100 on 12/06/17.   3. Morbid obesity: body habitus and medical comorbidities suggestive of metabolic syndrome. Enrolled in weight loss program, having good success -counseled on diet and exercise -she is amenable to continuing to address weight goals in the future.   Prevention: -recommend heart healthy/Mediterranean diet, with whole grains, fruits, vegetable, fish, lean meats, nuts, and olive oil. Limit salt. -recommend moderate walking, 3-5 times/week for 30-50 minutes each session. Aim for at least 150 minutes.week. Goal  should be pace of 3 miles/hours, or walking 1.5 miles in 30 minutes -recommend avoidance of tobacco products. Avoid excess alcohol. -Additional risk factor control:  -Diabetes: A1c is 6.5, management per PCP  -Lipids: LDL 100 on simvastatin, as above  -Blood pressure control: goal <130/80, as above  -Weight: encouraged weight loss, as above.  Follow up: 1 year or sooner PRN  Medication Adjustments/Labs and Tests Ordered: Current medicines are reviewed at length with the patient today.  Concerns regarding medicines are outlined above.  No orders of the defined types were placed in this encounter.  No orders of the defined types were placed in this encounter.   Patient Instructions  Medication Instructions:  Your Physician recommend you continue on your current medication as directed.    If you need a refill on your cardiac medications before your next appointment, please call your pharmacy.   Lab work: None   Testing/Procedures: None  Follow-Up: At [facility], you and your health needs are our priority.  As part of our continuing mission to provide you with exceptional heart care, we have created designated Provider Care Teams.  These Care Teams include your primary Cardiologist (physician) and Advanced Practice Providers (APPs -  Physician Assistants and Nurse Practitioners) who all work together to provide you with the care you need, when you need it. You will need a follow up appointment in 1 years.  Please call our office 2 months in advance to schedule this appointment.  You may see Buford Dresser, MD or one of the following Advanced Practice Providers on your designated Care Team:   Rosaria Ferries, PA-C . Jory Sims, DNP, ANP  Any Other Special Instructions Will Be Listed Below (If Applicable).       Signed, Buford Dresser, MD PhD 03/01/2018 8:21 AM  [facility]

## 2018-03-04 ENCOUNTER — Other Ambulatory Visit: Payer: Self-pay | Admitting: Internal Medicine

## 2018-03-13 ENCOUNTER — Ambulatory Visit (INDEPENDENT_AMBULATORY_CARE_PROVIDER_SITE_OTHER): Payer: 59 | Admitting: Family Medicine

## 2018-03-13 VITALS — BP 130/74 | HR 67 | Temp 98.4°F | Ht 64.0 in | Wt 269.0 lb

## 2018-03-13 DIAGNOSIS — E559 Vitamin D deficiency, unspecified: Secondary | ICD-10-CM

## 2018-03-13 DIAGNOSIS — F3289 Other specified depressive episodes: Secondary | ICD-10-CM

## 2018-03-13 DIAGNOSIS — Z6841 Body Mass Index (BMI) 40.0 and over, adult: Secondary | ICD-10-CM

## 2018-03-13 DIAGNOSIS — Z9189 Other specified personal risk factors, not elsewhere classified: Secondary | ICD-10-CM

## 2018-03-13 MED ORDER — BUPROPION HCL ER (SR) 150 MG PO TB12: 150.0000 mg | ORAL_TABLET | Freq: Every day | ORAL | 0 refills | Status: DC

## 2018-03-13 MED ORDER — VITAMIN D (ERGOCALCIFEROL) 1.25 MG (50000 UNIT) PO CAPS: 50000.0000 [IU] | ORAL_CAPSULE | ORAL | 0 refills | Status: DC

## 2018-03-14 NOTE — Progress Notes (Signed)
Office: [phone]  /  Fax: [phone]   HPI:   Chief Complaint: OBESITY Crystal Wallace is here to discuss her progress with her obesity treatment plan. Crystal Wallace is on the Category 3 plan and is following her eating plan approximately 90 % of the time. Crystal Wallace states Crystal Wallace is exercising 0 minutes 0 times per week. Crystal Wallace is losing her job tomorrow and will be changing to 2nd shift at a new job next week. Crystal Wallace notes an increase in stress and comfort eating and is frustrated with her weight gain.  Her weight is 269 lb (122 kg) today and has had a weight gain of 4 pounds over a period of 3 weeks since her last visit. Crystal Wallace has lost 17 lbs since starting treatment with [name].  Depression with emotional eating behaviors [place] is struggling with emotional eating and using food for comfort to the extent that it is negatively impacting her health. Crystal Wallace often snacks when Crystal Wallace is not hungry. Crystal Wallace sometimes feels Crystal Wallace is out of control and then feels guilty that Crystal Wallace made poor food choices. Crystal Wallace notes a decrease in mood and increased stress eating with a forced job change. Crystal Wallace notes increased irritability and anxiety. Crystal Wallace denies suicidal ideations or homicidal ideations.  Vitamin D deficiency Crystal Wallace has a diagnosis of vitamin D deficiency. Crystal Wallace is currently taking vit D and is stable. Crystal Wallace denies nausea, vomiting, or muscle weakness.  At risk for cardiovascular disease Crystal Wallace is at a higher than average risk for cardiovascular disease due to vitamin D deficiency and obesity. Crystal Wallace currently denies any chest pain.  ALLERGIES: No Known Allergies  MEDICATIONS: Current Outpatient Medications on File Prior to Visit  Medication Sig Dispense Refill  . acetaminophen (TYLENOL) 500 MG tablet Take 1,000 mg by mouth at bedtime as needed for mild pain.    Marland Kitchen amLODipine (NORVASC) 5 MG tablet One po daily 90 tablet 1  . blood glucose meter kit and supplies KIT Dispense based on patient and insurance preference. Use up to four times  daily as directed. (FOR ICD-9 250.00, 250.01). 1 each 0  . carvedilol (COREG) 12.5 MG tablet TAKE 1 TABLET(12.5 MG) BY MOUTH TWICE DAILY WITH A MEAL 60 tablet 3  . cholecalciferol (VITAMIN D) 1000 UNITS tablet Take 1,000 Units by mouth daily.    . cyclobenzaprine (FLEXERIL) 10 MG tablet TAKE 1 TABLET(10 MG) BY MOUTH AT BEDTIME 30 tablet 0  . furosemide (LASIX) 40 MG tablet TAKE 1 TABLET(40 MG) BY MOUTH DAILY 90 tablet 0  . glucose blood test strip Use as instructed 100 each 11  . glucose monitoring kit (FREESTYLE) monitoring kit 1 each by Does not apply route as needed for other. 1 each 0  . ketoconazole (NIZORAL) 2 % cream Apply 1 application topically daily. 30 g 5  . levonorgestrel (MIRENA) 20 MCG/24HR IUD 1 each by Intrauterine route once.    . metFORMIN (GLUCOPHAGE-XR) 750 MG 24 hr tablet TAKE 1 TABLET(750 MG) BY MOUTH DAILY WITH BREAKFAST 30 tablet 5  . Multiple Vitamin (MULTIVITAMIN WITH MINERALS) TABS tablet Take 1 tablet by mouth daily.    Marland Kitchen olmesartan (BENICAR) 40 MG tablet Take 1 tablet (40 mg total) by mouth daily. 90 tablet 0  . Omega-3 Fatty Acids (FISH OIL) 1000 MG CAPS Take 1 capsule by mouth daily.    . potassium chloride SA (K-DUR,KLOR-CON) 20 MEQ tablet TAKE 1 TABLET(20 MEQ) BY MOUTH DAILY 30 tablet 0  . simvastatin (ZOCOR) 40 MG tablet Take 1 tablet (40 mg total) by  mouth at bedtime. 90 tablet 3   No current facility-administered medications on file prior to visit.     PAST MEDICAL HISTORY: Past Medical History:  Diagnosis Date  . Diabetes ([place])   . HTN (hypertension)   . Hx of blood clots   . Hyperlipidemia   . Lactose intolerance   . Vitamin D deficiency     PAST SURGICAL HISTORY: Past Surgical History:  Procedure Laterality Date  .  c section  1999  . adenoid removal age 38    . cervical 5 and cervical 6 neck surgery  12-07-2010  . ROBOT ASSISTED MYOMECTOMY  10/20/2011   Procedure: ROBOTIC ASSISTED MYOMECTOMY;  Surgeon: Lahoma Crocker, MD;  Location: [facility];  Service: Gynecology;  Laterality: N/A;    SOCIAL HISTORY: Social History   Tobacco Use  . Smoking status: Never Smoker  . Smokeless tobacco: Never Used  Substance Use Topics  . Alcohol use: No  . Drug use: No    FAMILY HISTORY: Family History  Problem Relation Age of Onset  . Arthritis Mother   . Heart disease Mother   . Hyperlipidemia Mother   . Hypertension Father   . Diabetes Father   . Obesity Father     ROS: Review of Systems  Constitutional: Negative for weight loss.  Cardiovascular: Negative for chest pain.  Gastrointestinal: Negative for nausea and vomiting.  Musculoskeletal:       Negative for muscle weakness.  Psychiatric/Behavioral: Positive for depression. Negative for suicidal ideas.       Negative for homicidal ideations.    PHYSICAL EXAM: Blood pressure 130/74, pulse 67, temperature 98.4 F (36.9 C), temperature source Oral, height '5\' 4"'$  (1.626 m), weight 269 lb (122 kg), SpO2 95 %. Body mass index is 46.17 kg/m. Physical Exam  Constitutional: Crystal Wallace is oriented to person, place, and time. Crystal Wallace appears well-developed and well-nourished.  Cardiovascular: Normal rate.  Pulmonary/Chest: Effort normal.  Musculoskeletal: Normal range of motion.  Neurological: Crystal Wallace is oriented to person, place, and time.  Skin: Skin is warm and dry.  Psychiatric: Crystal Wallace has a normal mood and affect. Her behavior is normal.  Vitals reviewed.   RECENT LABS AND TESTS: BMET    Component Value Date/Time   NA 139 12/06/2017 1036   K 4.1 12/06/2017 1036   CL 100 12/06/2017 1036   CO2 19 (L) 12/06/2017 1036   GLUCOSE 116 (H) 12/06/2017 1036   GLUCOSE 114 (H) 07/10/2017 0908   BUN 11 12/06/2017 1036   CREATININE 0.56 (L) 12/06/2017 1036   CREATININE 0.65 07/10/2017 0908   CALCIUM 9.6 12/06/2017 1036   GFRNONAA 116 12/06/2017 1036   GFRNONAA 110 07/10/2017 0908   GFRAA 134 12/06/2017 1036   GFRAA 128 07/10/2017 0908   Lab Results  Component Value Date   HGBA1C 6.5  (H) 12/06/2017   HGBA1C 6.4 (H) 07/10/2017   HGBA1C 6.2 (H) 05/19/2016   HGBA1C 6.6 (H) 02/07/2016   HGBA1C 6.6 (H) 11/01/2015   Lab Results  Component Value Date   INSULIN 35.9 (H) 12/06/2017   CBC    Component Value Date/Time   WBC 11.2 (H) 12/06/2017 1036   WBC 13.1 (H) 07/10/2017 0908   RBC 4.36 12/06/2017 1036   RBC 4.52 07/10/2017 0908   HGB 13.1 12/06/2017 1036   HCT 38.9 12/06/2017 1036   PLT 392 07/10/2017 0908   MCV 89 12/06/2017 1036   MCH 30.0 12/06/2017 1036   MCH 29.4 07/10/2017 0908   MCHC 33.7 12/06/2017 1036  MCHC 34.4 07/10/2017 0908   RDW 13.7 12/06/2017 1036   LYMPHSABS 4.4 (H) 12/06/2017 1036   MONOABS 0.5 04/16/2015 0901   EOSABS 0.3 12/06/2017 1036   BASOSABS 0.1 12/06/2017 1036   Iron/TIBC/Ferritin/ %Sat    Component Value Date/Time   IRON 112 04/16/2015 0901   TIBC 303 04/16/2015 0901   IRONPCTSAT 37 04/16/2015 0901   Lipid Panel     Component Value Date/Time   CHOL 179 12/06/2017 1036   TRIG 138 12/06/2017 1036   HDL 51 12/06/2017 1036   CHOLHDL 3.8 08/07/2017 0933   VLDL 28 05/19/2016 1128   LDLCALC 100 (H) 12/06/2017 1036   LDLCALC 104 (H) 08/07/2017 0933   Hepatic Function Panel     Component Value Date/Time   PROT 6.8 12/06/2017 1036   ALBUMIN 4.4 12/06/2017 1036   AST 13 12/06/2017 1036   ALT 17 12/06/2017 1036   ALKPHOS 83 12/06/2017 1036   BILITOT 0.7 12/06/2017 1036   BILIDIR 0.2 08/07/2017 0933   IBILI 0.9 08/07/2017 0933      Component Value Date/Time   TSH 1.480 12/06/2017 1036   TSH 1.63 07/10/2017 0908   TSH 0.986 04/16/2015 0901   Results for KHRYSTYNE, ARPIN (MRN [id number]) as of 03/14/2018 14:26  Ref. Range 12/06/2017 10:36  Vitamin D, 25-Hydroxy Latest Ref Range: 30.0 - 100.0 ng/mL 27.7 (L)   ASSESSMENT AND PLAN: Vitamin D deficiency - Plan: Vitamin D, Ergocalciferol, (DRISDOL) 1.25 MG (50000 UT) CAPS capsule  Other depression - with emotional eating - Plan: buPROPion (WELLBUTRIN SR) 150 MG 12 hr  tablet  At risk for heart disease  Class 3 severe obesity with serious comorbidity and body mass index (BMI) of 45.0 to 49.9 in adult, unspecified obesity type ([place])  PLAN:  Vitamin D Deficiency Crystal Wallace was informed that low vitamin D levels contributes to fatigue and are associated with obesity, breast, and colon cancer. Crystal Wallace agrees to continue to take prescription Vit D '@50'$ ,000 IU every week #4 with no refills and will follow up for routine testing of vitamin D, at least 2-3 times per year. Crystal Wallace was informed of the risk of over-replacement of vitamin D and agrees to not increase her dose unless Crystal Wallace discusses this with [name] first. Crystal Wallace agrees to follow up in 3 to 4 weeks.  Cardiovascular risk counseling Jacora was given extended (15 minutes) coronary artery disease prevention counseling today. Crystal Wallace is 42 y.o. female and has risk factors for heart disease including vitamin D deficiency and obesity. We discussed intensive lifestyle modifications today with an emphasis on specific weight loss instructions and strategies. Pt was also informed of the importance of increasing exercise and decreasing saturated fats to help prevent heart disease.  Depression with Emotional Eating Behaviors We discussed behavior modification techniques today to help Crystal Wallace deal with her emotional eating and depression. Crystal Wallace has agreed to start Wellbutrin SR 150 mg qAM #30 with no refills and agreed to follow up as directed in 3 to 4 weeks.  Obesity Crystal Wallace is currently in the action stage of change. As such, her goal is to maintain weight through the job transition & Thanksgiving. Crystal Wallace has agreed to follow the Category 3 plan. Crystal Wallace has been instructed to work up to a goal of 150 minutes of combined cardio and strengthening exercise per week for weight loss and overall health benefits. We discussed the following Behavioral Modification Strategies today: increasing lean protein intake, decreasing simple carbohydrates,  keeping healthy foods in the home, holiday eating strategies,  and emotional eating strategies.  Crystal Wallace has agreed to follow up with our clinic in 3 to 4 weeks. Crystal Wallace was informed of the importance of frequent follow up visits to maximize her success with intensive lifestyle modifications for her multiple health conditions.   OBESITY BEHAVIORAL INTERVENTION VISIT  Today's visit was # 6   Starting weight: 286 lbs Starting date: 12/06/17 Today's weight : Weight: 269 lb (122 kg)  Today's date: 03/13/2018 Total lbs lost to date: 35  ASK: We discussed the diagnosis of obesity with Crystal Wallace Gave today and Crystal Wallace agreed to give [name] permission to discuss obesity behavioral modification therapy today.  ASSESS: Janel has the diagnosis of obesity and her BMI today is 46.15. Suzzette is in the action stage of change.   ADVISE: Crystal Wallace was educated on the multiple health risks of obesity as well as the benefit of weight loss to improve her health. Crystal Wallace was advised of the need for long term treatment and the importance of lifestyle modifications to improve her current health and to decrease her risk of future health problems.  AGREE: Multiple dietary modification options and treatment options were discussed and Crystal Wallace agreed to follow the recommendations documented in the above note.  ARRANGE: Crystal Wallace was educated on the importance of frequent visits to treat obesity as outlined per CMS and USPSTF guidelines and agreed to schedule her next follow up appointment today.  I, Marcille Blanco, am acting as transcriptionist for Starlyn Skeans, MD  I have reviewed the above documentation for accuracy and completeness, and I agree with the above. -Dennard Nip, MD

## 2018-03-14 NOTE — Progress Notes (Unsigned)
Office: [phone]  /  Fax: [phone]   Date: March 18, 2018 Time Seen:*** Duration:*** Provider: Glennie Wallace, Psy.D. Type of Session: Individual Therapy   HPI: Crystal Wallace referred by Crystal Wallace seen for an initial appointment by this provider on December 20, 2017. Per the note for the initial visit withDr. Desmond Wallace December 06, 2017, Crystal Wallace reported experiencing the following:picky eater and doesn't like to eat healthier foods;skips meals frequently;frequentlydrinksliquids with calories;frequently makes poor food choices;frequently eats larger portions than normal; andstruggles with emotional eating.In addition,[place] been heavy most of herlife, and does not know when she started gaining weight. Crystal Wallace'sheaviest weight ever was 290pounds.Duringthe initial appointment with this provider, Crystal Wallace shared, "I don't think I am an emotional eater." She noted, "I eat when I am hungry." Since starting with the clinic, Crystal Wallace explained she has not been as hungry as she used to be. Moreover, she indicated during her initial appointment with Dr. Leafy Wallace, they discussed that Crystal Wallace believes she is not an emotional eater, but there is still a possibility she may be. She shared she craves salty and crunchy foods (e.g., potato chips). Crystal Wallace explained she would crave the aforementioned when menstruating. Furthermore, she shared she would eat certain things out of habit.Additionally, she expressed worry about her ability to continue being successful with the meal plan. More specifically, she discussed birthdays earlier this year caused her to be unsuccessful with the healthy changes she was trying to implement.Also,Crystal Wallace described engaging in "all or nothing" thinking as she decribed various foods (e.g., cake) she cannot have at all as they are notnoted on her currentmeal plan.She denied binging, purging,andother compensatory strategies. Crystal Wallace denied ever  being diagnosed with an eating disorder. Previously, Crystal Wallace ([place]) shared she tried to diet "a whole lot of times." She described "doing good" for a period of time, but then she would begin eating whatever she wanted to.Crystal Wallace asked to complete a questionnaire assessing various behaviors related to emotional eating. Crystal Wallace the following: overeat when you are celebrating.  Session Content: Session focused on the following treatment goal: {gbtxgoals:21759}. The session was initiated with the administration of the PHQ-9 and GAD-7, as well as a brief check-in.   Crystal Wallace was receptive to today's session as evidenced by ***.  Mental Status Examination: Crystal Wallace arrived on time for the appointment. She presented as appropriately dressed and groomed. Crystal Wallace appeared her stated age and demonstrated adequate orientation to time, place, person, and purpose of the appointment. She also demonstrated appropriate eye contact. No psychomotor abnormalities or behavioral peculiarities noted. Her mood was {gbmood:21757} with congruent affect. Her thought processes were logical, linear, and goal-directed. No hallucinations, delusions, bizarre thinking or behavior reported or observed. Judgment, insight, and impulse control appeared to be grossly intact. There was no evidence of paraphasias (i.e., errors in speech, gross mispronunciations, and word substitutions), repetition deficits, or disturbances in volume or prosody (i.e., rhythm and intonation). There was no evidence of attention or memory impairments. Crystal Wallace denied current suicidal and homicidal ideation, intent or plan.  Structured Assessment Results: The Patient Health Questionnaire-9 (PHQ-9) is a self-report measure that assesses symptoms and severity of depression over the course of the last two weeks. Crystal Wallace obtained a score of *** suggesting {GBPHQ9SEVERITY:21752}. Crystal Wallace finds the endorsed symptoms to be {gbphq9difficulty:21754}.  The Generalized  Anxiety Disorder-7 (GAD-7) is a brief self-report measure that assesses symptoms of anxiety over the course of the last two weeks. Crystal Wallace obtained a score of *** suggesting {gbgad7severity:21753}.  Interventions: Crystal Wallace was administered the PHQ-9 and GAD-7 for symptom monitoring.  Content from the last session was reviewed. Throughout today's session, empathic reflections and validation were provided. Psychoeducation regarding *** was provided and *** [insert other interventions].   DSM-5 Diagnosis: 300.09 (F41.8) Other Specified Anxiety Disorder, Generalized anxiety not occurring more days than not and Emotional eating  Treatment Goal & Progress: [name] was seen for an initial appointment with this provider on December 20, 2017 during which the following treatment goal was established: decrease symptoms of anxiety. [name] has demonstrated progress in her goal of decreasing symptoms of anxiety as evidenced by ***  Plan: [name] continues to appear able and willing to participate as evidenced by engagement in reciprocal conversation, and asking questions for clarification as appropriate.*** The next appointment will be scheduled in {gbweeks:21758}. The next session will focus on reviewing learned skills, and working towards the established treatment goal.***

## 2018-03-18 ENCOUNTER — Ambulatory Visit (INDEPENDENT_AMBULATORY_CARE_PROVIDER_SITE_OTHER): Payer: Self-pay | Admitting: Psychology

## 2018-03-19 ENCOUNTER — Other Ambulatory Visit: Payer: Self-pay

## 2018-03-19 MED ORDER — POTASSIUM CHLORIDE CRYS ER 20 MEQ PO TBCR: EXTENDED_RELEASE_TABLET | ORAL | 11 refills | Status: DC

## 2018-04-06 ENCOUNTER — Encounter (INDEPENDENT_AMBULATORY_CARE_PROVIDER_SITE_OTHER): Payer: Self-pay | Admitting: Family Medicine

## 2018-04-08 ENCOUNTER — Other Ambulatory Visit: Payer: Self-pay | Admitting: Internal Medicine

## 2018-04-08 NOTE — Telephone Encounter (Signed)
Please address. Thanks!

## 2018-04-10 ENCOUNTER — Encounter (INDEPENDENT_AMBULATORY_CARE_PROVIDER_SITE_OTHER): Payer: Self-pay

## 2018-04-10 ENCOUNTER — Ambulatory Visit (INDEPENDENT_AMBULATORY_CARE_PROVIDER_SITE_OTHER): Payer: Self-pay | Admitting: Family Medicine

## 2018-04-24 ENCOUNTER — Ambulatory Visit (INDEPENDENT_AMBULATORY_CARE_PROVIDER_SITE_OTHER): Payer: 59 | Admitting: Internal Medicine

## 2018-04-24 ENCOUNTER — Encounter (INDEPENDENT_AMBULATORY_CARE_PROVIDER_SITE_OTHER): Payer: Self-pay

## 2018-04-24 ENCOUNTER — Encounter: Payer: Self-pay | Admitting: Internal Medicine

## 2018-04-24 VITALS — BP 140/100 | HR 94 | Temp 99.6°F | Ht 64.0 in | Wt 270.0 lb

## 2018-04-24 DIAGNOSIS — J22 Unspecified acute lower respiratory infection: Secondary | ICD-10-CM

## 2018-04-24 DIAGNOSIS — R509 Fever, unspecified: Secondary | ICD-10-CM

## 2018-04-24 DIAGNOSIS — J101 Influenza due to other identified influenza virus with other respiratory manifestations: Secondary | ICD-10-CM

## 2018-04-24 DIAGNOSIS — R05 Cough: Secondary | ICD-10-CM | POA: Diagnosis not present

## 2018-04-24 DIAGNOSIS — R059 Cough, unspecified: Secondary | ICD-10-CM

## 2018-04-24 DIAGNOSIS — R6883 Chills (without fever): Secondary | ICD-10-CM | POA: Diagnosis not present

## 2018-04-24 LAB — POCT INFLUENZA A/B
Influenza A, POC: NEGATIVE
Influenza B, POC: POSITIVE — AB

## 2018-04-24 MED ORDER — CEFTRIAXONE SODIUM 1 G IJ SOLR
1.0000 g | Freq: Once | INTRAMUSCULAR | Status: AC
  Administered 2018-04-24: 1 g via INTRAMUSCULAR

## 2018-04-24 MED ORDER — HYDROCODONE-HOMATROPINE 5-1.5 MG/5ML PO SYRP: 5.0000 mL | ORAL_SOLUTION | Freq: Three times a day (TID) | ORAL | 0 refills | Status: DC | PRN

## 2018-04-24 NOTE — Patient Instructions (Signed)
Rocephin 1 g IM.  Augmentin samples provided.  Tamiflu 75 mg twice daily for 5 days.  Rest and drink plenty of fluids.  Tylenol if needed for fever or myalgias.  Hycodan 1 teaspoon p.o. every 8 hours as needed cough.

## 2018-04-24 NOTE — Progress Notes (Signed)
   Subjective:    Patient ID: Crystal Wallace, female    DOB: Nov 12, 1975, 42 y.o.   MRN: [id number]  HPI 42 year old Female with URI symptoms since Monday. Sinus pressure. Shaking chills. Cough with green sputum. No sore throat. Malaise and fatigue. Left ear itches. Did not take flu vaccine. She was absent when it was offered at work. 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 Works 6pm to [employer]. Denies significant myalgias.   Rapid flu test positive for Influenza B   Review of Systems see above     Objective:   Physical Exam Skin warm and dry. Nodes none Left TM dull chronically scarred. Right TM dull. Pharynx without exudate. Chest clear without rales or wheezes.       Assessment & Plan:  Acute Influenza  B  Acute lower respiratory infection  Plan: Tamiflu 75 mg bid x 5 days  Hycodan one tsp po q 8 hours prn cough.Rest and drink plenty of fluids. Augmentin samples provided.  Rocephin 1 g IM.  Advise no work x 24-48 hours. She is reluctant to miss work since she is just starting a new job.

## 2018-06-05 ENCOUNTER — Other Ambulatory Visit: Payer: Self-pay | Admitting: Internal Medicine

## 2018-09-19 MED ORDER — OLMESARTAN MEDOXOMIL 40 MG PO TABS: 40.0000 mg | ORAL_TABLET | Freq: Every day | ORAL | 0 refills | Status: DC

## 2018-09-23 ENCOUNTER — Other Ambulatory Visit: Payer: Self-pay | Admitting: Internal Medicine

## 2018-09-23 NOTE — Telephone Encounter (Signed)
Received Fax RX request from  [facility]  Medication - simvastatin (ZOCOR) 40 MG tablet   Last Refill - 04/15/18  Last OV - 04/24/18  Last CPE - 07/13/17

## 2018-09-23 NOTE — Telephone Encounter (Signed)
Left message to call back  

## 2018-09-23 NOTE — Telephone Encounter (Signed)
Last CPE was March 2019. Can we book CPE and labs?

## 2018-09-27 ENCOUNTER — Other Ambulatory Visit: Payer: Self-pay

## 2018-09-27 ENCOUNTER — Emergency Department (HOSPITAL_COMMUNITY)
Admission: EM | Admit: 2018-09-27 | Discharge: 2018-09-27 | Disposition: A | Discharge status: Home / Self Care | Payer: 59 | Attending: Emergency Medicine | Admitting: Emergency Medicine

## 2018-09-27 ENCOUNTER — Emergency Department (HOSPITAL_COMMUNITY): Payer: 59

## 2018-09-27 DIAGNOSIS — E119 Type 2 diabetes mellitus without complications: Secondary | ICD-10-CM | POA: Insufficient documentation

## 2018-09-27 DIAGNOSIS — R51 Headache: Secondary | ICD-10-CM | POA: Diagnosis not present

## 2018-09-27 DIAGNOSIS — Z7984 Long term (current) use of oral hypoglycemic drugs: Secondary | ICD-10-CM | POA: Insufficient documentation

## 2018-09-27 DIAGNOSIS — R55 Syncope and collapse: Secondary | ICD-10-CM | POA: Diagnosis present

## 2018-09-27 DIAGNOSIS — Y939 Activity, unspecified: Secondary | ICD-10-CM | POA: Diagnosis not present

## 2018-09-27 DIAGNOSIS — M542 Cervicalgia: Secondary | ICD-10-CM | POA: Diagnosis not present

## 2018-09-27 DIAGNOSIS — S199XXA Unspecified injury of neck, initial encounter: Secondary | ICD-10-CM

## 2018-09-27 DIAGNOSIS — Y999 Unspecified external cause status: Secondary | ICD-10-CM | POA: Insufficient documentation

## 2018-09-27 DIAGNOSIS — Z79899 Other long term (current) drug therapy: Secondary | ICD-10-CM | POA: Insufficient documentation

## 2018-09-27 DIAGNOSIS — Y929 Unspecified place or not applicable: Secondary | ICD-10-CM | POA: Diagnosis not present

## 2018-09-27 DIAGNOSIS — I10 Essential (primary) hypertension: Secondary | ICD-10-CM | POA: Diagnosis not present

## 2018-09-27 DIAGNOSIS — S0990XA Unspecified injury of head, initial encounter: Secondary | ICD-10-CM
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
## 2018-11-15 ENCOUNTER — Other Ambulatory Visit: Payer: Self-pay

## 2018-11-15 MED ORDER — METFORMIN HCL ER 750 MG PO TB24: ORAL_TABLET | ORAL | 5 refills | Status: DC

## 2018-12-10 ENCOUNTER — Other Ambulatory Visit: Payer: Self-pay

## 2018-12-10 MED ORDER — METFORMIN HCL ER 750 MG PO TB24: ORAL_TABLET | ORAL | 5 refills | Status: DC

## 2018-12-31 ENCOUNTER — Telehealth: Payer: Self-pay | Admitting: Internal Medicine

## 2018-12-31 NOTE — Telephone Encounter (Signed)
We can do this after August 31

## 2018-12-31 NOTE — Telephone Encounter (Signed)
Crystal Wallace [phone]  [name] called and she needs to come in and see you to discuss getting a letter for a new job. She is currently doing contract work for them, but she has an opportunity to get hired permantly with them, but they sent her to some one for job physical and there is a question about lifting, so they said she needed to be cleared by her PCP. I ask her to try and get documentation from where she went.

## 2019-01-01 NOTE — Telephone Encounter (Signed)
LVM to CB, we need to get more information, before scheduling an appointment.

## 2019-01-01 NOTE — Telephone Encounter (Addendum)
Spoke with Crystal Wallace and Crystal Wallace the RN for [employer], what they are wanting is a note stating that Crystal Wallace has not been seen for any neck or back issues in the past with you. Crystal Wallace stated she does not need any restrictions. Crystal Wallace the RN is going to try and get the notes form the Health at work physician, she said the paperwork we received was because [name] told them that she had surgery about 8-9 years ago on her neck.

## 2019-01-01 NOTE — Telephone Encounter (Signed)
Angela Nevin called back from [place], she had spoken with th [place], NP that [name] seen for the work physical and her blood pressure was high, so that is what she needs to follow up here with and get note about back and neck issues.

## 2019-01-01 NOTE — Telephone Encounter (Signed)
Appointment scheduled.

## 2019-01-01 NOTE — Telephone Encounter (Signed)
We will see her next Wednesday

## 2019-01-08 ENCOUNTER — Ambulatory Visit (INDEPENDENT_AMBULATORY_CARE_PROVIDER_SITE_OTHER): Payer: 59 | Admitting: Internal Medicine

## 2019-01-08 ENCOUNTER — Encounter: Payer: Self-pay | Admitting: Internal Medicine

## 2019-01-08 ENCOUNTER — Other Ambulatory Visit: Payer: Self-pay

## 2019-01-08 VITALS — BP 140/90 | HR 78 | Temp 98.1°F | Ht 67.0 in | Wt 269.0 lb

## 2019-01-08 DIAGNOSIS — I10 Essential (primary) hypertension: Secondary | ICD-10-CM

## 2019-01-08 DIAGNOSIS — F3289 Other specified depressive episodes: Secondary | ICD-10-CM

## 2019-01-08 DIAGNOSIS — Z6841 Body Mass Index (BMI) 40.0 and over, adult: Secondary | ICD-10-CM

## 2019-01-08 DIAGNOSIS — E781 Pure hyperglyceridemia: Secondary | ICD-10-CM

## 2019-01-08 DIAGNOSIS — Z23 Encounter for immunization: Secondary | ICD-10-CM | POA: Diagnosis not present

## 2019-01-08 DIAGNOSIS — E119 Type 2 diabetes mellitus without complications: Secondary | ICD-10-CM

## 2019-01-08 MED ORDER — OLMESARTAN MEDOXOMIL 40 MG PO TABS: 40.0000 mg | ORAL_TABLET | Freq: Every day | ORAL | 1 refills | Status: DC

## 2019-01-08 MED ORDER — AMLODIPINE BESYLATE 5 MG PO TABS: ORAL_TABLET | ORAL | 1 refills | Status: DC

## 2019-01-08 MED ORDER — METFORMIN HCL ER 750 MG PO TB24: ORAL_TABLET | ORAL | 5 refills | Status: DC

## 2019-01-08 MED ORDER — SIMVASTATIN 40 MG PO TABS: 40.0000 mg | ORAL_TABLET | Freq: Every day | ORAL | 3 refills | Status: DC

## 2019-01-08 MED ORDER — BUPROPION HCL ER (SR) 150 MG PO TB12: 150.0000 mg | ORAL_TABLET | Freq: Every day | ORAL | 0 refills | Status: DC
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
## 2019-01-09 ENCOUNTER — Telehealth: Payer: Self-pay | Admitting: Internal Medicine

## 2019-01-09 MED ORDER — FUROSEMIDE 40 MG PO TABS: ORAL_TABLET | ORAL | 1 refills | Status: DC

## 2019-01-09 MED ORDER — CARVEDILOL 12.5 MG PO TABS: ORAL_TABLET | ORAL | 3 refills | Status: DC

## 2019-01-09 NOTE — Telephone Encounter (Signed)
Crystal Wallace [phone]  [facility]  carvedilol (COREG) 12.5 MG tablet furosemide (LASIX) 40 MG tablet     [name] called back to say that she also needs refills on above listed medications

## 2019-01-26 ENCOUNTER — Encounter: Payer: Self-pay | Admitting: Internal Medicine

## 2019-01-26 NOTE — Patient Instructions (Signed)
It was a pleasure to see you today.  Congratulations on new job.  We will work with you to continue your prescription refills.  Please follow-up here in 3 to 6 months after you have insurance coverage.

## 2019-01-28 ENCOUNTER — Other Ambulatory Visit: Payer: Self-pay

## 2019-01-28 MED ORDER — METFORMIN HCL ER 750 MG PO TB24: ORAL_TABLET | ORAL | 11 refills | Status: DC

## 2019-02-03 ENCOUNTER — Telehealth: Payer: Self-pay | Admitting: Internal Medicine

## 2019-02-03 NOTE — Telephone Encounter (Signed)
Fax received from pharmacy requesting 90 day supply [facility] Carvedilol tab 12.5mg  which was last filled 01/09/2019 Metformin ER tab 750mg  GP which was last filled 01/08/2019 Last ov: 01/08/2019 Last cpe: 07/13/17 Next cpe: overdue

## 2019-03-13 ENCOUNTER — Telehealth: Payer: Self-pay | Admitting: *Deleted

## 2019-03-13 NOTE — Telephone Encounter (Signed)
We have left several message, re: her follow up visit.

## 2019-03-25 ENCOUNTER — Telehealth: Payer: Self-pay | Admitting: *Deleted

## 2019-03-25 NOTE — Telephone Encounter (Signed)
We have left Ms.Swisher several messages re: her follow up visit, with no response. Recall removed.

## 2019-06-25 ENCOUNTER — Telehealth: Payer: Self-pay | Admitting: Internal Medicine

## 2019-06-25 DIAGNOSIS — U071 COVID-19: Secondary | ICD-10-CM

## 2019-06-25 DIAGNOSIS — J22 Unspecified acute lower respiratory infection: Secondary | ICD-10-CM

## 2019-06-25 MED ORDER — AZITHROMYCIN 250 MG PO TABS: ORAL_TABLET | ORAL | 0 refills | Status: DC

## 2019-06-25 NOTE — Telephone Encounter (Signed)
Patient called to say she tested positive for Covid 19 through her workplace in [place]. Her 0000000 risk of complication score is 3. She has HTN and morbid obesity. She tested positive Feb 11 and is to be out of work for 2 weeks she says. Started with symptoms late last week. Has had headache and now has myalgias. May take OTC analgesics. Needs to monitor pulse ox. Is not nauseated but coughing up discolored sputum. Call in [place] to [facility] which is her new pharmacy. Call if symptoms worsen. Stay hydrated.

## 2019-07-02 ENCOUNTER — Other Ambulatory Visit: Payer: Self-pay

## 2019-07-02 ENCOUNTER — Ambulatory Visit: Payer: Self-pay | Attending: Internal Medicine

## 2019-07-02 DIAGNOSIS — Z20822 Contact with and (suspected) exposure to covid-19: Secondary | ICD-10-CM

## 2019-07-02 MED ORDER — CARVEDILOL 12.5 MG PO TABS: ORAL_TABLET | ORAL | 11 refills | Status: DC

## 2019-07-02 MED ORDER — OLMESARTAN MEDOXOMIL 40 MG PO TABS: 40.0000 mg | ORAL_TABLET | Freq: Every day | ORAL | 3 refills | Status: DC

## 2019-07-02 MED ORDER — FUROSEMIDE 40 MG PO TABS: ORAL_TABLET | ORAL | 3 refills | Status: DC

## 2019-07-02 MED ORDER — SIMVASTATIN 40 MG PO TABS: 40.0000 mg | ORAL_TABLET | Freq: Every day | ORAL | 3 refills | Status: DC

## 2019-07-02 MED ORDER — AMLODIPINE BESYLATE 5 MG PO TABS: ORAL_TABLET | ORAL | 3 refills | Status: DC

## 2019-07-02 MED ORDER — METFORMIN HCL ER 750 MG PO TB24: ORAL_TABLET | ORAL | 11 refills | Status: DC

## 2019-07-03 ENCOUNTER — Other Ambulatory Visit: Payer: Self-pay

## 2019-07-03 LAB — NOVEL CORONAVIRUS, NAA: SARS-CoV-2, NAA: NOT DETECTED

## 2019-07-03 MED ORDER — CARVEDILOL 12.5 MG PO TABS: ORAL_TABLET | ORAL | 3 refills | Status: DC

## 2019-07-04 ENCOUNTER — Other Ambulatory Visit: Payer: Self-pay

## 2019-07-04 MED ORDER — METFORMIN HCL ER 750 MG PO TB24: ORAL_TABLET | ORAL | 3 refills | Status: DC

## 2019-07-16 IMAGING — CT CT PELVIS W/O CM
1 series · 15 of 32 positions shown, 19 images · non-contrast
Comparison: Pelvis x-ray 12/03/2017

CLINICAL DATA: Remote placement of IUD which is no longer
detectable. Recent IUD placement for birth control.

EXAM:
CT PELVIS WITHOUT CONTRAST
TECHNIQUE: Multidetector CT imaging of the pelvis was performed following the
standard protocol without intravenous contrast.

[Series 3: routine abdomen/pelvis with · axial · 0.88mm/px · z∈[+584,+804]mm · 15 of 50 slices shown, 19 images]
[im 4/50  soft-tissue]
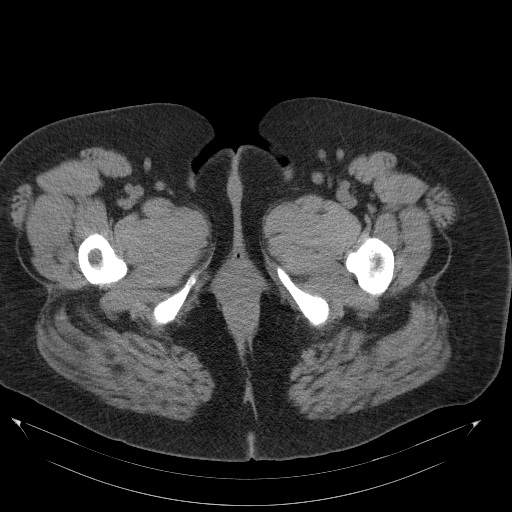
[im 4/50  bone]
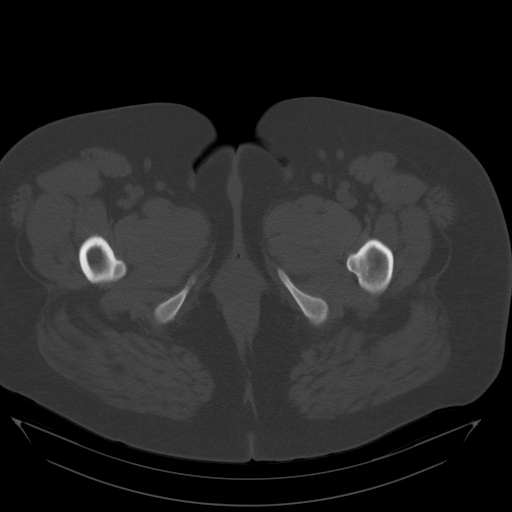
[im 7/50  soft-tissue]
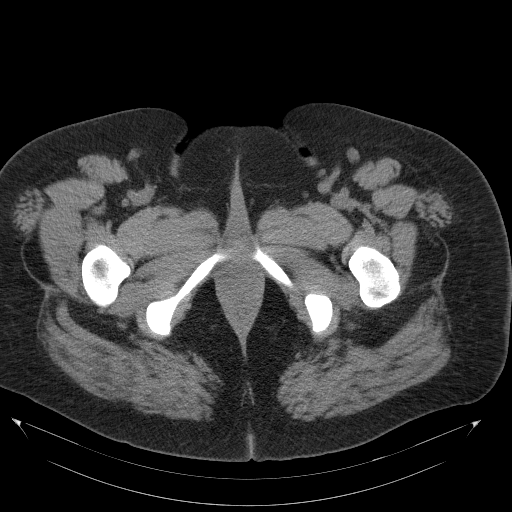
[im 10/50  soft-tissue]
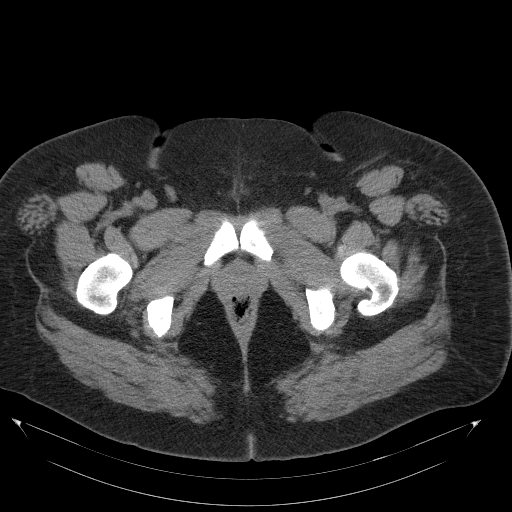
[im 15/50  soft-tissue]
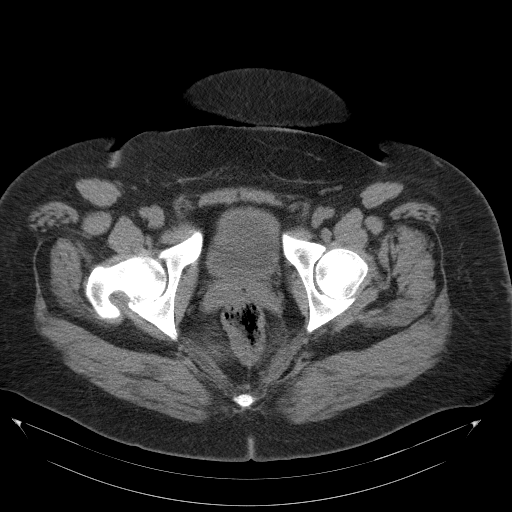
[im 18/50  soft-tissue]
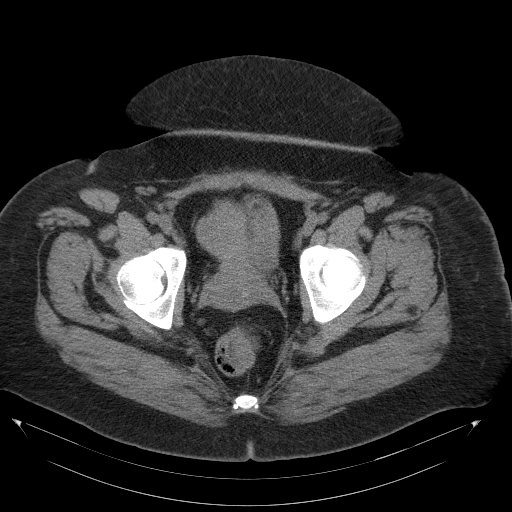
[im 21/50  soft-tissue]
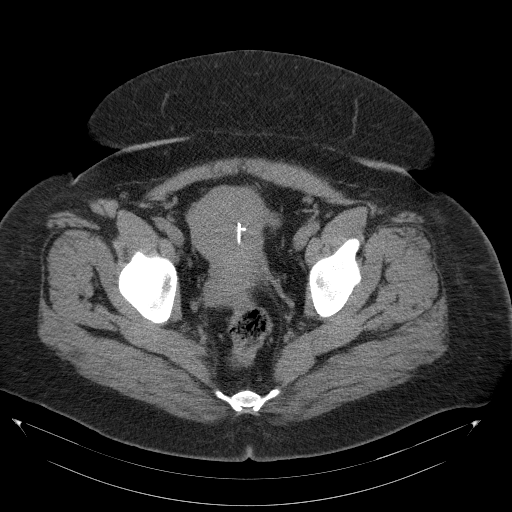
[im 26/50  soft-tissue]
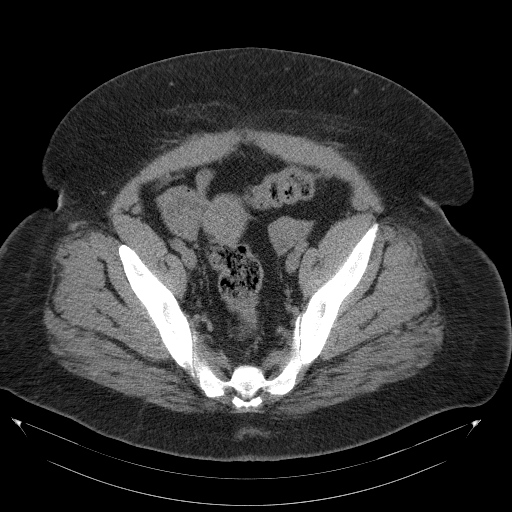
[im 29/50  soft-tissue]
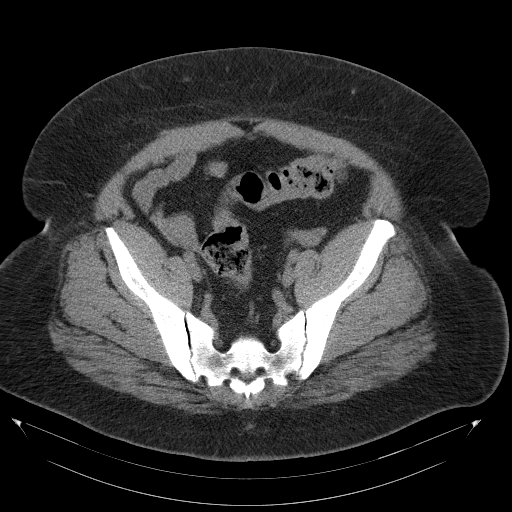
[im 32/50  soft-tissue]
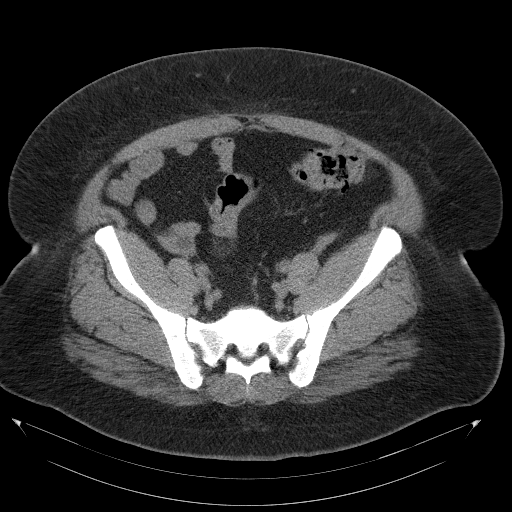
[im 32/50  bone]
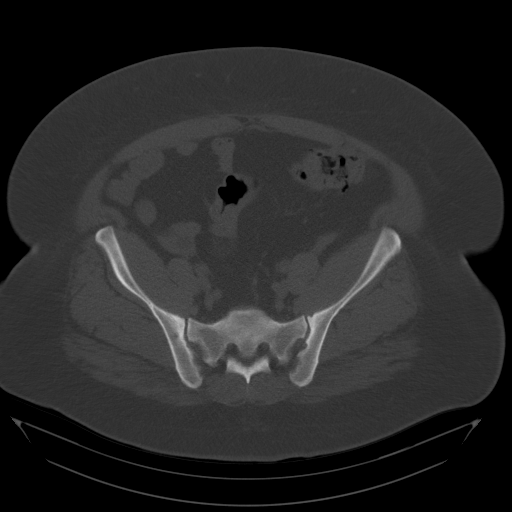
[im 35/50  soft-tissue]
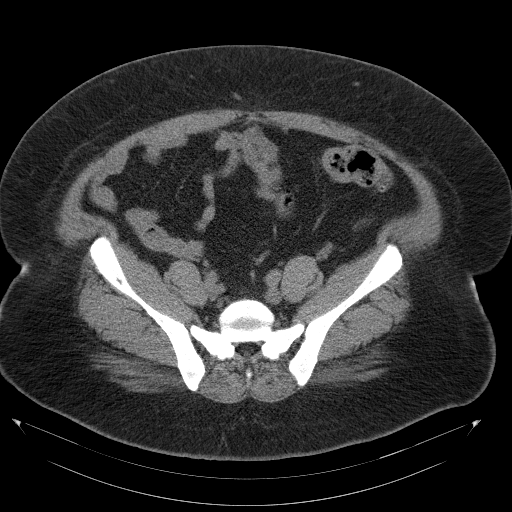
[im 40/50  soft-tissue]
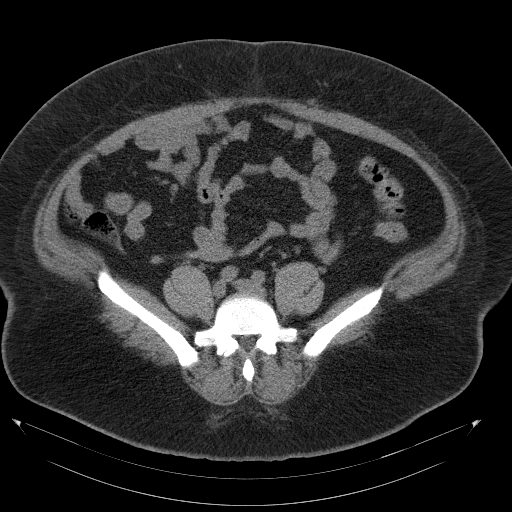
[im 43/50  soft-tissue]
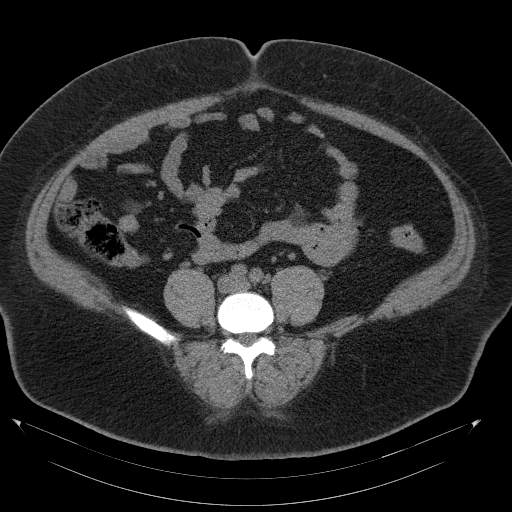
[im 43/50  lung]
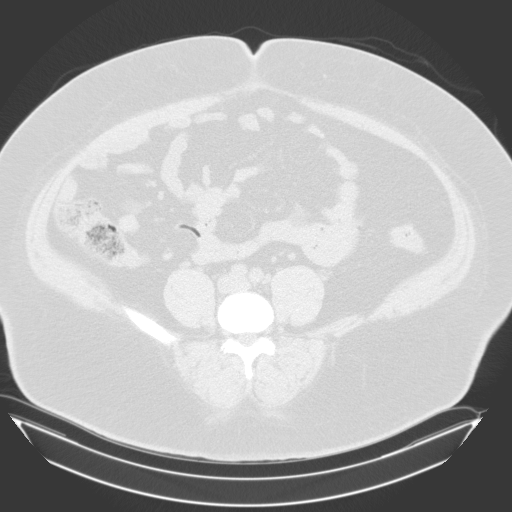
[im 45/50  lung]
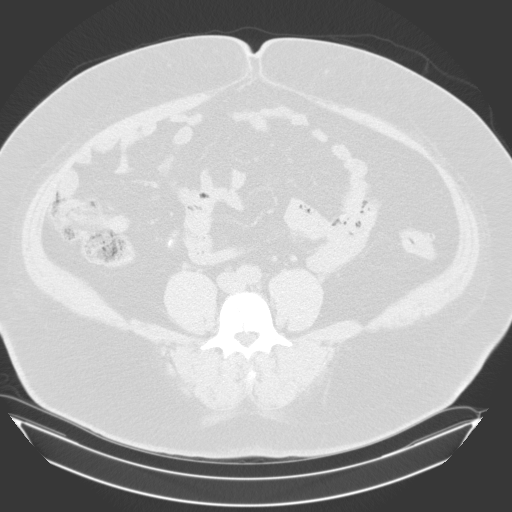
[im 46/50  soft-tissue]
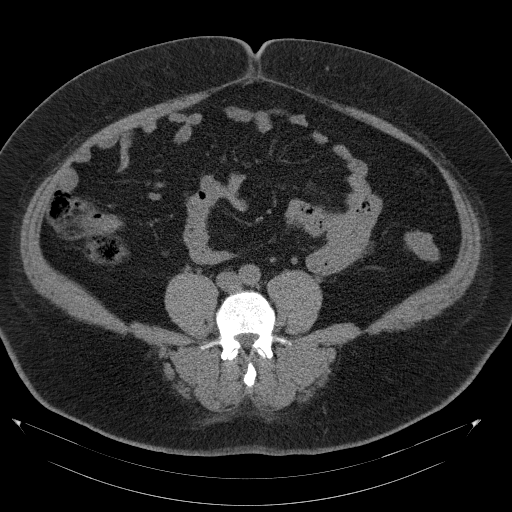
[im 46/50  lung]
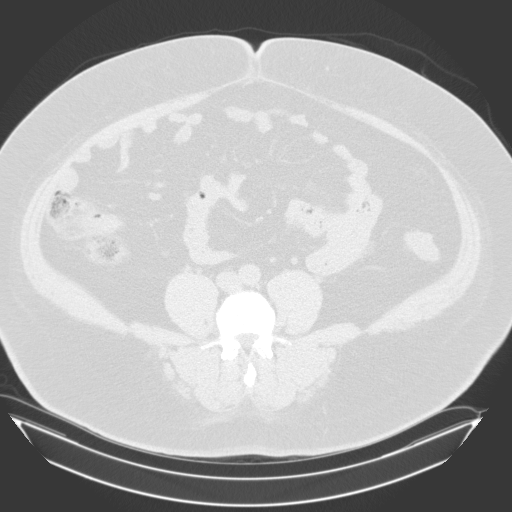
[im 48/50  lung]
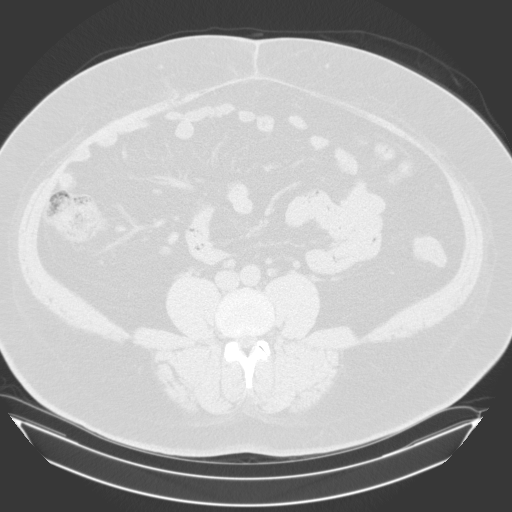

[15 of 32 positions shown; findings below may reference images not displayed]

FINDINGS: Urinary Tract:  Bladder decompressed.

Bowel: Visualize small bowel loops and colonic segments of the
pelvis are unremarkable. Appendicoliths or radiopaque ingested
foreign body identified in the appendix. There is no appendiceal
dilatation or inflammation and appendix is otherwise normal.

Vascular/Lymphatic: Unremarkable common and external iliac arteries.
Insert no pelvic sidewall lymphadenopathy.

Reproductive: 2 IUDs are identified in the uterus. 1 is high in the
fundal region and is inverted. The end to which the threads attach
is just deep to the serosal surface in the region of and potentially
in the left cornua. Alternatively, this IUD may have eroded into the
left fundal myometrium in the region of the cornua.

The second IUD is located in the mid uterus with the long axis
apparently in the endometrial cavity and the thread attachment
directed towards the cervix. This IUD appears rotated 90 degrees
with the T body directed in an anterior -posterior direction
relative to the long axis of the uterus.

Other:  No intraperitoneal free fluid.

Musculoskeletal: No worrisome lytic or sclerotic osseous
abnormality.
IMPRESSION: 1. 2 separate IUDs are visualized in the uterus. 1 of these appears
inverted with the thread attachment positioned in the region of the
superficial left fundus / cornua. The second is in the mid uterus,
apparently in the endometrial canal, with the T top directed in an
anterior posterior direction.
2. Otherwise unremarkable study.

## 2019-07-17 ENCOUNTER — Ambulatory Visit: Payer: Self-pay | Attending: Internal Medicine

## 2019-07-17 DIAGNOSIS — Z20822 Contact with and (suspected) exposure to covid-19: Secondary | ICD-10-CM

## 2019-07-18 LAB — NOVEL CORONAVIRUS, NAA: SARS-CoV-2, NAA: NOT DETECTED

## 2019-09-25 ENCOUNTER — Ambulatory Visit (HOSPITAL_COMMUNITY)
Admission: EM | Admit: 2019-09-25 | Discharge: 2019-09-25 | Disposition: A | Discharge status: Home / Self Care | Payer: 59 | Attending: Family Medicine | Admitting: Family Medicine

## 2019-09-25 ENCOUNTER — Encounter (HOSPITAL_COMMUNITY): Payer: Self-pay

## 2019-09-25 DIAGNOSIS — I10 Essential (primary) hypertension: Secondary | ICD-10-CM | POA: Diagnosis not present

## 2019-09-25 DIAGNOSIS — E782 Mixed hyperlipidemia: Secondary | ICD-10-CM | POA: Diagnosis not present

## 2019-09-25 DIAGNOSIS — Z8249 Family history of ischemic heart disease and other diseases of the circulatory system: Secondary | ICD-10-CM | POA: Insufficient documentation

## 2019-09-25 DIAGNOSIS — Z833 Family history of diabetes mellitus: Secondary | ICD-10-CM | POA: Diagnosis not present

## 2019-09-25 DIAGNOSIS — Z975 Presence of (intrauterine) contraceptive device: Secondary | ICD-10-CM | POA: Diagnosis not present

## 2019-09-25 DIAGNOSIS — E559 Vitamin D deficiency, unspecified: Secondary | ICD-10-CM | POA: Diagnosis not present

## 2019-09-25 DIAGNOSIS — J0100 Acute maxillary sinusitis, unspecified: Secondary | ICD-10-CM | POA: Diagnosis not present

## 2019-09-25 DIAGNOSIS — Z79899 Other long term (current) drug therapy: Secondary | ICD-10-CM | POA: Diagnosis not present

## 2019-09-25 DIAGNOSIS — Z20822 Contact with and (suspected) exposure to covid-19: Secondary | ICD-10-CM | POA: Diagnosis not present

## 2019-09-25 DIAGNOSIS — Z7984 Long term (current) use of oral hypoglycemic drugs: Secondary | ICD-10-CM | POA: Diagnosis not present

## 2019-09-25 DIAGNOSIS — Z8349 Family history of other endocrine, nutritional and metabolic diseases: Secondary | ICD-10-CM | POA: Insufficient documentation

## 2019-09-25 DIAGNOSIS — E119 Type 2 diabetes mellitus without complications: Secondary | ICD-10-CM | POA: Insufficient documentation

## 2019-09-25 LAB — SARS CORONAVIRUS 2 (TAT 6-24 HRS): SARS Coronavirus 2: NEGATIVE

## 2019-09-25 MED ORDER — FLUTICASONE PROPIONATE 50 MCG/ACT NA SUSP: 1.0000 | Freq: Every day | NASAL | 2 refills | Status: DC

## 2019-09-25 MED ORDER — CETIRIZINE HCL 10 MG PO TABS: 10.0000 mg | ORAL_TABLET | Freq: Every day | ORAL | 0 refills | Status: DC

## 2019-09-25 MED ORDER — AMOXICILLIN-POT CLAVULANATE 875-125 MG PO TABS: 1.0000 | ORAL_TABLET | Freq: Two times a day (BID) | ORAL | 0 refills | Status: DC

## 2019-09-25 NOTE — Discharge Instructions (Signed)
Treated for a sinus infection.  Take medication as prescribed.

## 2019-09-25 NOTE — ED Provider Notes (Signed)
[facility]    CSN: [id number] Arrival date & time: 09/25/19  3220      History   Chief Complaint Chief Complaint  Patient presents with  . COVID symptoms    HPI Crystal Wallace is a 44 y.o. female.   Patient is a 44 year old female past medical history of diabetes, hypertension, blood clots, hyperlipidemia.  She presents today with approximately 2 weeks of nasal congestion, brown mucus, sinus pressure.  She has also had a productive cough with green mucus.  Symptoms have been constant and worsening.  History of bronchitis.  Non-smoker.  Has also had some left ear itching and discomfort.  History of ear infections.  No associated fever, chills, body aches, night sweats.  No chest pain or shortness of breath.  ROS per HPI      Past Medical History:  Diagnosis Date  . Diabetes ([place])   . HTN (hypertension)   . Hx of blood clots   . Hyperlipidemia   . Lactose intolerance   . Vitamin D deficiency     Patient Active Problem List   Diagnosis Date Noted  . Vitamin D deficiency 01/15/2018  . History of iron deficiency 07/05/2015  . Type 2 diabetes mellitus without complication ([place]) 25/42/7062  . Morbid obesity ([place]) 07/05/2015  . Resistant hypertension 12/04/2011  . Mixed hyperlipidemia 12/04/2011    Past Surgical History:  Procedure Laterality Date  .  c section  1999  . adenoid removal age 49    . cervical 5 and cervical 6 neck surgery  12-07-2010  . ROBOT ASSISTED MYOMECTOMY  10/20/2011   Procedure: ROBOTIC ASSISTED MYOMECTOMY;  Surgeon: Lahoma Crocker, MD;  Location: [facility];  Service: Gynecology;  Laterality: N/A;    OB History    Gravida  1   Para      Term      Preterm      AB      Living  1     SAB      TAB      Ectopic      Multiple      Live Births               Home Medications    Prior to Admission medications   Medication Sig Start Date End Date Taking? Authorizing Provider  acetaminophen (TYLENOL) 500 MG tablet  Take 1,000 mg by mouth at bedtime as needed for mild pain.    [provider]  amLODipine (NORVASC) 5 MG tablet Take one tablet by mouth once daily. 07/02/19   Elby Showers, MD  amoxicillin-clavulanate (AUGMENTIN) 875-125 MG tablet Take 1 tablet by mouth every 12 (twelve) hours. 09/25/19   Loura Halt A, NP  blood glucose meter kit and supplies KIT Dispense based on patient and insurance preference. Use up to four times daily as directed. (FOR ICD-9 250.00, 250.01). 12/06/17   Dennard Nip D, MD  carvedilol (COREG) 12.5 MG tablet TAKE 1 TABLET(12.5 MG) BY MOUTH TWICE DAILY WITH A MEAL 07/03/19   Elby Showers, MD  cetirizine (ZYRTEC) 10 MG tablet Take 1 tablet (10 mg total) by mouth daily. 09/25/19   Loura Halt A, NP  cholecalciferol (VITAMIN D) 1000 UNITS tablet Take 1,000 Units by mouth daily.    [provider]  cyclobenzaprine (FLEXERIL) 10 MG tablet TAKE 1 TABLET(10 MG) BY MOUTH AT BEDTIME 10/12/17   Elby Showers, MD  fluticasone (FLONASE) 50 MCG/ACT nasal spray Place 1 spray into both nostrils  daily. 09/25/19   Loura Halt A, NP  furosemide (LASIX) 40 MG tablet TAKE 1 TABLET(40 MG) BY MOUTH DAILY 07/02/19   Elby Showers, MD  glucose blood test strip Use as instructed 07/16/13   Elby Showers, MD  glucose monitoring kit (FREESTYLE) monitoring kit 1 each by Does not apply Wallace as needed for other. 07/16/13   Elby Showers, MD  levonorgestrel (MIRENA) 20 MCG/24HR IUD 1 each by Intrauterine Wallace once.    [provider]  metFORMIN (GLUCOPHAGE-XR) 750 MG 24 hr tablet TAKE 1 TABLET(750 MG) BY MOUTH DAILY WITH BREAKFAST 07/04/19   Elby Showers, MD  Multiple Vitamin (MULTIVITAMIN WITH MINERALS) TABS tablet Take 1 tablet by mouth daily.    [provider]  olmesartan (BENICAR) 40 MG tablet Take 1 tablet (40 mg total) by mouth daily. 07/02/19   Elby Showers, MD  Omega-3 Fatty Acids (FISH OIL) 1000 MG CAPS Take 1 capsule by mouth daily.    [provider]    potassium chloride SA (K-DUR,KLOR-CON) 20 MEQ tablet TAKE 1 TABLET(20 MEQ) BY MOUTH DAILY 03/19/18   Elby Showers, MD  simvastatin (ZOCOR) 40 MG tablet Take 1 tablet (40 mg total) by mouth at bedtime. 07/02/19   Elby Showers, MD  buPROPion (WELLBUTRIN SR) 150 MG 12 hr tablet Take 1 tablet (150 mg total) by mouth daily. 01/08/19 09/25/19  Elby Showers, MD    Family History Family History  Problem Relation Age of Onset  . Arthritis Mother   . Heart disease Mother   . Hyperlipidemia Mother   . Hypertension Father   . Diabetes Father   . Obesity Father     Social History Social History   Tobacco Use  . Smoking status: Never Smoker  . Smokeless tobacco: Never Used  Substance Use Topics  . Alcohol use: No  . Drug use: No     Allergies   Patient has no known allergies.   Review of Systems Review of Systems   Physical Exam Triage Vital Signs ED Triage Vitals  Enc Vitals Group     BP 09/25/19 0853 (!) 183/104     Pulse Rate 09/25/19 0853 84     Resp 09/25/19 0853 16     Temp 09/25/19 0853 98.7 F (37.1 C)     Temp Source 09/25/19 0853 Oral     SpO2 09/25/19 0853 98 %     Weight 09/25/19 0854 280 lb (127 kg)     Height 09/25/19 0854 5' 3.5" (1.613 m)     Head Circumference --      Peak Flow --      Pain Score 09/25/19 0854 5     Pain Loc --      Pain Edu? --      Excl. in [place]? --    No data found.  Updated Vital Signs BP (!) 183/104   Pulse 84   Temp 98.7 F (37.1 C) (Oral)   Resp 16   Ht 5' 3.5" (1.613 m)   Wt 280 lb (127 kg)   SpO2 98%   BMI 48.82 kg/m   Visual Acuity Right Eye Distance:   Left Eye Distance:   Bilateral Distance:    Right Eye Near:   Left Eye Near:    Bilateral Near:     Physical Exam Vitals and nursing note reviewed.  Constitutional:      General: She is not in acute distress.    Appearance: Normal  appearance. She is not ill-appearing, toxic-appearing or diaphoretic.  HENT:     Head: Normocephalic.     Right Ear:  Tympanic membrane and ear canal normal.     Left Ear: Tympanic membrane and ear canal normal.     Nose: Nose normal.     Comments: Sounds congested Bilateral nasal turbinate swelling Maxillary sinus pressure    Mouth/Throat:     Pharynx: Oropharynx is clear.  Eyes:     Conjunctiva/sclera: Conjunctivae normal.  Cardiovascular:     Rate and Rhythm: Normal rate and regular rhythm.     Pulses: Normal pulses.     Heart sounds: Normal heart sounds.  Pulmonary:     Effort: Pulmonary effort is normal.     Breath sounds: Normal breath sounds.  Musculoskeletal:        General: Normal range of motion.     Cervical back: Normal range of motion.  Skin:    General: Skin is warm and dry.     Findings: No rash.  Neurological:     Mental Status: She is alert.  Psychiatric:        Mood and Affect: Mood normal.      UC Treatments / Results  Labs (all labs ordered are listed, but only abnormal results are displayed) Labs Reviewed  SARS CORONAVIRUS 2 (TAT 6-24 HRS)    EKG   Radiology No results found.  Procedures Procedures (including critical care time)  Medications Ordered in UC Medications - No data to display  Initial Impression / Assessment and Plan / UC Course  I have reviewed the triage vital signs and the nursing notes.  Pertinent labs & imaging results that were available during my care of the patient were reviewed by me and considered in my medical decision making (see chart for details).     Acute maxillary sinusitis Treating with Augmentin and starting cetirizine and Flonase for symptoms. covid swab pending.  Follow up as needed for continued or worsening symptoms  Final Clinical Impressions(s) / UC Diagnoses   Final diagnoses:  Acute non-recurrent maxillary sinusitis     Discharge Instructions     Treated for a sinus infection.  Take medication as prescribed.    ED Prescriptions    Medication Sig Dispense Auth. Provider   amoxicillin-clavulanate  (AUGMENTIN) 875-125 MG tablet Take 1 tablet by mouth every 12 (twelve) hours. 14 tablet Dalanie Kisner A, NP   fluticasone (FLONASE) 50 MCG/ACT nasal spray Place 1 spray into both nostrils daily. 16 g Chi Woodham A, NP   cetirizine (ZYRTEC) 10 MG tablet Take 1 tablet (10 mg total) by mouth daily. 30 tablet Loura Halt A, NP     PDMP not reviewed this encounter.   Orvan July, NP 09/25/19 [phone]

## 2019-09-25 NOTE — ED Triage Notes (Signed)
Pt c/o productive cough w/green/brown mucous, nasal congestion, left ear painx2 wks. Pt has non labored breathing. Skin color WNL. Lungs sound diminished throughout.

## 2019-12-30 ENCOUNTER — Other Ambulatory Visit: Payer: Self-pay | Admitting: Internal Medicine

## 2019-12-30 DIAGNOSIS — Z1231 Encounter for screening mammogram for malignant neoplasm of breast: Secondary | ICD-10-CM

## 2019-12-31 ENCOUNTER — Other Ambulatory Visit: Payer: Self-pay

## 2019-12-31 ENCOUNTER — Ambulatory Visit
Admission: RE | Admit: 2019-12-31 | Discharge: 2019-12-31 | Disposition: A | Discharge status: Home / Self Care | Payer: 59 | Source: Ambulatory Visit | Attending: Internal Medicine | Admitting: Internal Medicine

## 2019-12-31 DIAGNOSIS — Z1231 Encounter for screening mammogram for malignant neoplasm of breast: Secondary | ICD-10-CM

## 2020-01-01 ENCOUNTER — Telehealth: Payer: Self-pay | Admitting: Internal Medicine

## 2020-01-01 NOTE — Telephone Encounter (Signed)
LVM to CB and schedule CPE past due. Last one was 07/13/17.

## 2020-05-05 ENCOUNTER — Ambulatory Visit (INDEPENDENT_AMBULATORY_CARE_PROVIDER_SITE_OTHER): Payer: 59 | Admitting: Internal Medicine

## 2020-05-05 ENCOUNTER — Encounter: Payer: Self-pay | Admitting: Internal Medicine

## 2020-05-05 ENCOUNTER — Other Ambulatory Visit: Payer: Self-pay

## 2020-05-05 ENCOUNTER — Telehealth: Payer: Self-pay | Admitting: Internal Medicine

## 2020-05-05 VITALS — HR 113 | Temp 99.1°F

## 2020-05-05 DIAGNOSIS — E119 Type 2 diabetes mellitus without complications: Secondary | ICD-10-CM | POA: Diagnosis not present

## 2020-05-05 DIAGNOSIS — J22 Unspecified acute lower respiratory infection: Secondary | ICD-10-CM

## 2020-05-05 DIAGNOSIS — R509 Fever, unspecified: Secondary | ICD-10-CM

## 2020-05-05 DIAGNOSIS — E781 Pure hyperglyceridemia: Secondary | ICD-10-CM

## 2020-05-05 DIAGNOSIS — J3489 Other specified disorders of nose and nasal sinuses: Secondary | ICD-10-CM | POA: Diagnosis not present

## 2020-05-05 DIAGNOSIS — I10 Essential (primary) hypertension: Secondary | ICD-10-CM

## 2020-05-05 MED ORDER — AZITHROMYCIN 250 MG PO TABS: ORAL_TABLET | ORAL | 0 refills | Status: DC

## 2020-05-05 NOTE — Progress Notes (Signed)
° °  Subjective:    Patient ID: Crystal Wallace, female    DOB: 07/24/1975, 44 y.o.   MRN: [id number]  HPI 44 year old Female with history of hypertension, hyperlipidemia, and morbid obesity, type 2 diabetes mellitus seen today with complaint of sore throat, productive cough, postnasal drip and hoarseness. No known COVID-19 exposure that she is aware of. Says she had COVID-19 booster December 9. Had initial vaccine March 29 and April 26.  She tested positive for COVID-19 in February 2021 through her workplace in [place]. Her COVID-19 risk of complication score was 3. At that time had headache and myalgias. Advised her to monitor her pulse oximetry. She coughed up discolored sputum and we called in a Zithromax Z-PAK for her. She was advised to stay well-hydrated. She was out of work for about 2 weeks and did well.  In May she had an episode of acute maxillary sinusitis and was seen at [facility] urgent care center. Tested negative for COVID-19 and was treated with Augmentin and Zyrtec as well as Flonase nasal spray.      Review of Systems see above     Objective:   Physical Exam Her pulse is 113. Temperature 99.1 degrees pulse oximetry 96%. Respiratory rate is 15.  Skin warm and dry. Pharynx slightly injected. TMs are full bilaterally but not red. Neck is supple. Chest is clear to auscultation. COVID-19 test obtained by nasal swab and is pending.       Assessment & Plan:  Acute respiratory infection  History of COVID-27 June 2019  Vaccinated x3 for COVID-19 with the last dose being given December 9  History of diabetes mellitus treated with Metformin  Hypertension treated with multidrug regimen  Morbid obesity  Hyperlipidemia treated with statin medication  Quarantine at home. Note provided to employer to be out of work pending COVID-19 test results. Take Zithromax Z-PAK 2 p.o. day one followed by one p.o. days 2 through 5. Rest and drink plenty of fluids. Watch  Accu-Cheks. Call if symptoms worsen.

## 2020-05-05 NOTE — Telephone Encounter (Signed)
[name] [phone]  Crystal Wallace called to say that yesterday she started having sore throat, cough, today she is coughing up stuff and nose is draning in her throat, hoarse, no fever, No COVID-19 exposure that she knows of. Had COVID-19 Booster about 2 weeks ago.

## 2020-05-05 NOTE — Patient Instructions (Signed)
Note given to be out of work pending COVID-19 results. Take Zithromax Z-PAK 2 p.o. day one followed by one p.o. days 2 through 5. Quarantine at home. Rest and drink plenty of fluids. Watch for respiratory distress and call if symptoms worsen.

## 2020-05-05 NOTE — Telephone Encounter (Signed)
Car visit

## 2020-05-05 NOTE — Telephone Encounter (Signed)
Scheduled

## 2020-05-07 LAB — SARS-COV-2 RNA,(COVID-19) QUALITATIVE NAAT: SARS CoV2 RNA: NOT DETECTED

## 2020-12-07 ENCOUNTER — Telehealth: Payer: Self-pay | Admitting: Internal Medicine

## 2020-12-07 NOTE — Telephone Encounter (Signed)
Crystal Wallace [phone]  [name] calls to say her elbow has been hurting for a couple of months, and she has a knot on her right foot that hurts to push on it, not sure how long she has had it.  She is also needing refills on all of her medications, because she has not been taking them for  awhile. Has same insurance that she has had, works at night.

## 2020-12-07 NOTE — Telephone Encounter (Signed)
LVM to CB.

## 2020-12-08 NOTE — Telephone Encounter (Signed)
Scheduled appt.

## 2020-12-13 ENCOUNTER — Other Ambulatory Visit: Payer: Self-pay

## 2020-12-13 ENCOUNTER — Ambulatory Visit: Payer: 59 | Admitting: Internal Medicine

## 2020-12-13 ENCOUNTER — Encounter: Payer: Self-pay | Admitting: Internal Medicine

## 2020-12-13 VITALS — BP 200/100 | HR 99 | Temp 99.2°F | Ht 64.0 in | Wt 272.0 lb

## 2020-12-13 DIAGNOSIS — I10 Essential (primary) hypertension: Secondary | ICD-10-CM | POA: Diagnosis not present

## 2020-12-13 DIAGNOSIS — E119 Type 2 diabetes mellitus without complications: Secondary | ICD-10-CM

## 2020-12-13 DIAGNOSIS — E781 Pure hyperglyceridemia: Secondary | ICD-10-CM | POA: Diagnosis not present

## 2020-12-13 DIAGNOSIS — F3289 Other specified depressive episodes: Secondary | ICD-10-CM

## 2020-12-13 MED ORDER — METFORMIN HCL ER 750 MG PO TB24: ORAL_TABLET | ORAL | 3 refills | Status: DC

## 2020-12-13 MED ORDER — FUROSEMIDE 40 MG PO TABS: ORAL_TABLET | ORAL | 3 refills | Status: DC

## 2020-12-13 MED ORDER — SIMVASTATIN 40 MG PO TABS: 40.0000 mg | ORAL_TABLET | Freq: Every day | ORAL | 3 refills | Status: DC

## 2020-12-13 MED ORDER — SERTRALINE HCL 50 MG PO TABS: 50.0000 mg | ORAL_TABLET | Freq: Every day | ORAL | 5 refills | Status: DC

## 2020-12-13 MED ORDER — AMLODIPINE BESYLATE 5 MG PO TABS: ORAL_TABLET | ORAL | 3 refills | Status: DC

## 2020-12-13 MED ORDER — OLMESARTAN MEDOXOMIL 40 MG PO TABS: 40.0000 mg | ORAL_TABLET | Freq: Every day | ORAL | 3 refills | Status: DC

## 2020-12-13 MED ORDER — POTASSIUM CHLORIDE CRYS ER 20 MEQ PO TBCR: EXTENDED_RELEASE_TABLET | ORAL | 0 refills | Status: DC

## 2020-12-13 MED ORDER — CARVEDILOL 12.5 MG PO TABS: ORAL_TABLET | ORAL | 3 refills | Status: DC

## 2020-12-13 NOTE — Progress Notes (Signed)
   Subjective:    Patient ID: Crystal Wallace, female    DOB: [birth year], 45 y.o.   MRN: [id number]  HPI 44 year old Female not seen since Late December 2021.  At that time she had an acute respiratory infection.  Tested negative for COVID-19 and was treated with Zithromax Z-PAK.  She has a history of type 2 diabetes mellitus, hypertension and morbid obesity as well as hyperlipidemia.  History of COVID-19 in February 2021.  She recovered without hospitalization.  In May 2021 she had an episode of acute maxillary sinusitis, was seen at [facility] urgent care and tested negative for COVID-19.  Was treated with Augmentin, Zyrtec and Flonase nasal spray.  She called on August 2 complaining of elbow pain for couple of months and a knot on her right foot.  Also needed medication refills because she had not been taking her medications for some time.  Continues to work night shift.    Review of Systems     Objective:   Physical Exam Blood pressure now was 200/100 off medication.  Pulse is 99.  Temperature 99.2 and weight 272 pounds BMI 46.69  Tender over lateral epicondyle consistent with epicondylitis.  Tells me she wants to get back on her medications and get lipids, diabetes as well as hypertension under control.  Has had some pain over metatarsal but says that has been improving.  Do not think we should do x-ray today.  Today have refilled: Metformin XR 750 mg daily, Lasix 40 mg daily, potassium 20 mEq daily, amlodipine 5 mg daily, Coreg 12.5 mg twice daily, Benicar 40 mg daily, Zocor 40 mg daily.  History of type 2 diabetes mellitus, morbid obesity, essential hypertension requiring multiple medications, hyperlipidemia.  Zoloft has been discontinued.  Suggest we follow-up with labs only in early September and see where we are with lipid and diabetic control.      Assessment & Plan:  Mild epicondylitis of elbow  Musculoskeletal pain of foot- will not order Xray today  Type 2 diabetes  mellitus-has been off medication for some time.  Restart Metformin and have Hgb AIC drawn in 4 weeks.  Essential hypertension-has been off medications for some time and we will refill multiple blood pressure medications today.  She is to monitor blood pressure and let me know how things are going.  These include Lasix 40 mg daily, potassium supplement, amlodipine, Coreg, Benicar.   Hyperlipidemia-have refilled Zocor 40 mg daily.  Follow-up September 6 for lipid panel, liver functions  Addendum September 7: Unfortunately, Hemoglobin A1c is 11.3 %previously was 6.5 %in 2019.  Triglycerides are 168 and were 138 in 2019.  LDL is 110. These are about the same as in 2019.  Total cholesterol 176 and that is about the same as in 2019.    What is concerning is her hemoglobin A1c.  My staff called her twice to discuss results from September 6 and verify medication use on September 7 and September 9. Each time a message was left.  As of September 11, we have not heard back from her.  I have made an additional call on the evening of September 11 and left detailed info. Need to know if she is taking metformin or not before can add or adjust diabetic meds.

## 2021-01-04 ENCOUNTER — Other Ambulatory Visit: Payer: Self-pay | Admitting: Internal Medicine

## 2021-01-11 ENCOUNTER — Other Ambulatory Visit: Payer: Self-pay

## 2021-01-11 ENCOUNTER — Other Ambulatory Visit: Payer: 59 | Admitting: Internal Medicine

## 2021-01-11 DIAGNOSIS — E119 Type 2 diabetes mellitus without complications: Secondary | ICD-10-CM

## 2021-01-11 DIAGNOSIS — E781 Pure hyperglyceridemia: Secondary | ICD-10-CM

## 2021-01-11 DIAGNOSIS — I10 Essential (primary) hypertension: Secondary | ICD-10-CM

## 2021-01-12 ENCOUNTER — Telehealth: Payer: Self-pay

## 2021-01-12 LAB — LIPID PANEL
Cholesterol: 176 mg/dL (ref <200)
HDL: 38 mg/dL — ABNORMAL LOW (ref >=50)
LDL Cholesterol (Calc): 110 mg/dL (calc) — ABNORMAL HIGH
Non-HDL Cholesterol (Calc): 138 mg/dL (calc) — ABNORMAL HIGH (ref <130)
Total CHOL/HDL Ratio: 4.6 (calc) (ref <5.0)
Triglycerides: 168 mg/dL — ABNORMAL HIGH (ref <150)

## 2021-01-12 LAB — HEPATIC FUNCTION PANEL
AG Ratio: 1.8 (calc) (ref 1.0–2.5)
ALT: 19 U/L (ref 6–29)
AST: 16 U/L (ref 10–30)
Albumin: 4.2 g/dL (ref 3.6–5.1)
Alkaline phosphatase (APISO): 85 U/L (ref 31–125)
Bilirubin, Direct: 0.2 mg/dL (ref 0.0–0.2)
Globulin: 2.4 g/dL (calc) (ref 1.9–3.7)
Indirect Bilirubin: 0.8 mg/dL (calc) (ref 0.2–1.2)
Total Bilirubin: 1 mg/dL (ref 0.2–1.2)
Total Protein: 6.6 g/dL (ref 6.1–8.1)

## 2021-01-12 LAB — HEMOGLOBIN A1C
Hgb A1c MFr Bld: 11.3 % of total Hgb — ABNORMAL HIGH (ref <5.7)
Mean Plasma Glucose: 278 mg/dL
eAG (mmol/L): 15.4 mmol/L

## 2021-01-12 NOTE — Telephone Encounter (Signed)
-----   Message from Elby Showers, MD sent at 01/12/2021 12:05 PM EDT ----- Please call and see if she is taking Glucophage- AIC is 11%??? May need additional medication. ----- Message ----- From: Cheyenne Adas Lab Results In Sent: 01/12/2021  12:18 AM EDT To: Elby Showers, MD

## 2021-01-12 NOTE — Telephone Encounter (Signed)
Left message for patient to call office to discuss lab results and if she is taking her glucophage. Mychart message also sent to patient.

## 2021-01-14 ENCOUNTER — Telehealth: Payer: Self-pay

## 2021-01-14 NOTE — Telephone Encounter (Signed)
Left message for patient to call back to discuss results and verify medication use.

## 2021-01-14 NOTE — Telephone Encounter (Signed)
-----   Message from Elby Showers, MD sent at 01/12/2021 12:05 PM EDT ----- Please call and see if she is taking Glucophage- AIC is 11%??? May need additional medication. ----- Message ----- From: Cheyenne Adas Lab Results In Sent: 01/12/2021  12:18 AM EDT To: Elby Showers, MD

## 2021-01-16 NOTE — Patient Instructions (Signed)
Restarting medications for hypertension and diabetes mellitus with follow-up with labs only September 9.

## 2021-01-19 ENCOUNTER — Telehealth: Payer: Self-pay

## 2021-01-19 NOTE — Telephone Encounter (Signed)
Multiple attempts to reach patient.  She works nights so left message for her father to text her to follow up.

## 2021-01-19 NOTE — Telephone Encounter (Signed)
-----   Message from Elby Showers, MD sent at 01/12/2021 12:05 PM EDT ----- Please call and see if she is taking Glucophage- AIC is 11%??? May need additional medication. ----- Message ----- From: Cheyenne Adas Lab Results In Sent: 01/12/2021  12:18 AM EDT To: Elby Showers, MD

## 2021-02-16 ENCOUNTER — Other Ambulatory Visit: Payer: Self-pay | Admitting: Internal Medicine

## 2021-02-16 DIAGNOSIS — Z1231 Encounter for screening mammogram for malignant neoplasm of breast: Secondary | ICD-10-CM

## 2021-02-28 ENCOUNTER — Ambulatory Visit: Payer: 59 | Admitting: Internal Medicine

## 2021-02-28 ENCOUNTER — Telehealth: Payer: Self-pay

## 2021-02-28 ENCOUNTER — Encounter: Payer: Self-pay | Admitting: Internal Medicine

## 2021-02-28 ENCOUNTER — Other Ambulatory Visit: Payer: Self-pay

## 2021-02-28 VITALS — BP 158/90 | HR 82 | Temp 98.6°F

## 2021-02-28 DIAGNOSIS — J069 Acute upper respiratory infection, unspecified: Secondary | ICD-10-CM

## 2021-02-28 DIAGNOSIS — E119 Type 2 diabetes mellitus without complications: Secondary | ICD-10-CM

## 2021-02-28 DIAGNOSIS — H6693 Otitis media, unspecified, bilateral: Secondary | ICD-10-CM | POA: Diagnosis not present

## 2021-02-28 DIAGNOSIS — E1165 Type 2 diabetes mellitus with hyperglycemia: Secondary | ICD-10-CM | POA: Diagnosis not present

## 2021-02-28 MED ORDER — HYDROCODONE BIT-HOMATROP MBR 5-1.5 MG/5ML PO SOLN: 5.0000 mL | Freq: Three times a day (TID) | ORAL | 0 refills | Status: DC | PRN

## 2021-02-28 MED ORDER — METHYLPREDNISOLONE ACETATE 80 MG/ML IJ SUSP
80.0000 mg | Freq: Once | INTRAMUSCULAR | Status: AC
  Administered 2021-02-28: 80 mg via INTRAMUSCULAR

## 2021-02-28 MED ORDER — SITAGLIPTIN PHOSPHATE 100 MG PO TABS: 100.0000 mg | ORAL_TABLET | Freq: Every day | ORAL | 5 refills | Status: DC

## 2021-02-28 MED ORDER — AZITHROMYCIN 250 MG PO TABS: ORAL_TABLET | ORAL | 0 refills | Status: AC

## 2021-02-28 NOTE — Telephone Encounter (Signed)
Appt today

## 2021-02-28 NOTE — Telephone Encounter (Signed)
Negative COVID test

## 2021-02-28 NOTE — Patient Instructions (Addendum)
Try to find your glucose meter and check accuchecks. Take Zithromax Z pak as directed. Add generic Januvia to Glucophage. See me in 4-6 weeks to have Hgb AIC checked. If not improving will send you to [facility]. Hycodan cough syrup prescribed for cough.  Given Depo-Medrol 80 mg IM for respiratory congestion and ear congestion.  Cannot take decongestants given hypertension.

## 2021-02-28 NOTE — Telephone Encounter (Signed)
Patient called stating that she thinks she has a sinus infection. She had a headache yesterday but not today. She has a productive cough and it is thick mucous. Her ears are hurting her as well. Denies fever.

## 2021-02-28 NOTE — Progress Notes (Signed)
   Subjective:    Patient ID: Crystal Wallace, female    DOB: 06-24-1975, 45 y.o.   MRN: [id number]  HPI 45 year old Female seen with respiratory infection for several days. Father has been ill in hospital with cardiac issues and had stents placed.  He is now at home.  He is 44 years old and still working.  Patient is worried about him.  She is tearful in the office today.  Tells me she wants to try an injection for diabetes mellitus treatment.  I do not know if she is unable to afford it.  We may need to get her some patient assistance.  However she is only taking Glucophage for her diabetes and recently hemoglobin A1c was elevated at 11.3%.  Had been 6.5% in August 2019.  Finances are an issue for her.  Patient has bilateral ear pain and respiratory congestion.  No fever or shaking chills.  Cough is nonproductive.  Has been ill for about 3 days.  Has had some headache.  Has had maxillary sinus pressure but main complaint is bilateral ear pain.  Review of Systems see above-took COVID test before coming to the office which was negative     Objective:   Physical Exam Blood pressure 158/90 pulse 82 regular temperature 98.6 degrees pulse oximetry 97%  Both TMs are dull and red bilaterally.  Pharynx is slightly injected.  Neck is supple.  Chest is clear to auscultation without rales or wheezing.       Assessment & Plan:  Type 2 diabetes mellitus-currently just on Glucophage.  Add Januvia 100 mg daily and monitor Accu-Cheks.  She has lost her machine and her test strips.  She says they are around the house somewhere.  She needs to find these and start testing on a regular basis.  Follow-up in 4 weeks with hemoglobin A1c and office visit.  Acute upper respiratory infection  Acute bilateral otitis media  Situational stress with father  Plan: Zithromax Z-PAK 2 tabs day 1 followed by 1 tab days 2 through 5.  Hycodan 1 teaspoon every 8 hours as needed for cough.  Rest and drink fluids.  Off work  for 3 days which is helpful giving her time to recover.  Recheck in 4 weeks with hemoglobin A1c and if persistently elevated refer to endocrinology.  Continue Glucophage in addition to Januvia.  Given Depo-Medrol 80 mg IM for respiratory congestion.

## 2021-02-28 NOTE — Progress Notes (Signed)
   Subjective:    Patient ID: Crystal Wallace, female    DOB: [birth year], 45 y.o.   MRN: [id number]  HPI    Review of Systems     Objective:   Physical Exam        Assessment & Plan:

## 2021-03-01 MED ORDER — BLOOD GLUCOSE MONITOR KIT: PACK | 0 refills | Status: DC

## 2021-03-01 MED ORDER — BLOOD GLUCOSE MONITOR KIT
PACK | 0 refills | Status: DC
Start: 2021-03-01 — End: 2023-04-12

## 2021-03-03 ENCOUNTER — Other Ambulatory Visit: Payer: Self-pay

## 2021-03-03 ENCOUNTER — Telehealth: Payer: Self-pay | Admitting: Internal Medicine

## 2021-03-03 DIAGNOSIS — E119 Type 2 diabetes mellitus without complications: Secondary | ICD-10-CM

## 2021-03-03 MED ORDER — ACCU-CHEK GUIDE VI STRP: ORAL_STRIP | 12 refills | Status: DC

## 2021-03-03 MED ORDER — ACCU-CHEK SOFTCLIX LANCETS MISC: 12 refills | Status: DC

## 2021-03-03 NOTE — Telephone Encounter (Signed)
Done

## 2021-03-03 NOTE — Telephone Encounter (Signed)
Crystal Wallace [phone]  [name] called to see if you had sent in supplies for her glucose meter.

## 2021-03-05 ENCOUNTER — Other Ambulatory Visit: Payer: Self-pay | Admitting: Internal Medicine

## 2021-03-18 ENCOUNTER — Ambulatory Visit
Admission: RE | Admit: 2021-03-18 | Discharge: 2021-03-18 | Disposition: A | Discharge status: Home / Self Care | Payer: 59 | Source: Ambulatory Visit | Attending: Internal Medicine | Admitting: Internal Medicine

## 2021-03-18 ENCOUNTER — Other Ambulatory Visit: Payer: Self-pay

## 2021-03-18 DIAGNOSIS — Z1231 Encounter for screening mammogram for malignant neoplasm of breast: Secondary | ICD-10-CM

## 2021-07-22 ENCOUNTER — Encounter (HOSPITAL_COMMUNITY): Payer: Self-pay

## 2021-07-22 ENCOUNTER — Ambulatory Visit (HOSPITAL_COMMUNITY)
Admission: EM | Admit: 2021-07-22 | Discharge: 2021-07-22 | Disposition: A | Discharge status: Home / Self Care | Payer: 59 | Attending: Emergency Medicine | Admitting: Emergency Medicine

## 2021-07-22 DIAGNOSIS — J0100 Acute maxillary sinusitis, unspecified: Secondary | ICD-10-CM | POA: Diagnosis not present

## 2021-07-22 DIAGNOSIS — I10 Essential (primary) hypertension: Secondary | ICD-10-CM | POA: Diagnosis not present

## 2021-07-22 MED ORDER — CETIRIZINE HCL 10 MG PO TABS: 10.0000 mg | ORAL_TABLET | Freq: Every day | ORAL | 0 refills | Status: DC

## 2021-07-22 MED ORDER — GUAIFENESIN ER 600 MG PO TB12: 1200.0000 mg | ORAL_TABLET | Freq: Two times a day (BID) | ORAL | 0 refills | Status: DC

## 2021-07-22 MED ORDER — AMOXICILLIN-POT CLAVULANATE 875-125 MG PO TABS: 1.0000 | ORAL_TABLET | Freq: Two times a day (BID) | ORAL | 0 refills | Status: AC

## 2021-07-22 NOTE — Discharge Instructions (Signed)
Today you are being treated for a sinus infection, most sinus infections are caused by viruses and are prolonged once bacteria is introduced in the system, therefore we must give your body time to try to fight off the infection before we interfere ? ?Begin use of Mucinex taking 2 tablets every morning and every evening, this medication is to help thin out your secretions and allow them to drain ? ?Begin use of your Flonase, taking twice daily, this medication is to help clear out your sinus passages and will help with your ear pain ? ?Begin use of Zyrtec daily, this medication will help to reduce the amount of secretions produced by your body ? ?On day 7 of your illness which is Tuesday, July 26, 2021 if your symptoms have not improved you may begin use of Augmentin, take twice daily for the next 10 days ? ?Perform sinus irrigation 2-3 times a day with a [employer] sinus rinse kit and distilled water.  Do not use tap water. ? ?Increase your oral fluid intake to thin out your mucus so that is also able for your body to clear more easily. ? ? ?If you develop any new or worsening symptoms return for reevaluation or see your primary care provider.   ?

## 2021-07-22 NOTE — ED Provider Notes (Signed)
[facility]    CSN: [id number] Arrival date & time: 07/22/21  0818      History   Chief Complaint Chief Complaint  Patient presents with   Facial Pain    HPI Crystal Wallace is a 46 y.o. female.   Patient presents with nasal congestion, bilateral ear pain and fullness, sinus pain and pressure in the maxillary region, sore throat worsened by coughing, nonproductive cough and a generalized headache for 3 days.  Tolerating food and liquids.  No known sick contacts.  Has attempted use of Sudafed and Coricidin which has not been helpful.  History of diabetes, hypertension, hyperlipidemia.    Past Medical History:  Diagnosis Date   Diabetes (HCC)    HTN (hypertension)    Hx of blood clots    Hyperlipidemia    Lactose intolerance    Vitamin D deficiency     Patient Active Problem List   Diagnosis Date Noted   Vitamin D deficiency 01/15/2018   History of iron deficiency 07/05/2015   Type 2 diabetes mellitus without complication (HCC) 07/05/2015   Morbid obesity (HCC) 07/05/2015   Resistant hypertension 12/04/2011   Mixed hyperlipidemia 12/04/2011    Past Surgical History:  Procedure Laterality Date    c section  1999   adenoid removal age 81     cervical 5 and cervical 6 neck surgery  12-07-2010   ROBOT ASSISTED MYOMECTOMY  10/20/2011   Procedure: ROBOTIC ASSISTED MYOMECTOMY;  Surgeon: Antionette Char, MD;  Location: [facility];  Service: Gynecology;  Laterality: N/A;    OB History     Gravida  1   Para      Term      Preterm      AB      Living  1      SAB      IAB      Ectopic      Multiple      Live Births               Home Medications    Prior to Admission medications   Medication Sig Start Date End Date Taking? Authorizing Provider  Accu-Chek Softclix Lancets lancets Use as instructed 03/03/21   Margaree Mackintosh, MD  acetaminophen (TYLENOL) 500 MG tablet Take 1,000 mg by mouth at bedtime as needed for mild pain.    [provider]  amLODipine (NORVASC) 5 MG tablet Take one tablet by mouth once daily. 12/13/20   Margaree Mackintosh, MD  blood glucose meter kit and supplies KIT Dispense based on patient and insurance preference. Use up to four times daily as directed. (FOR ICD-9 250.00, 250.01). 03/01/21   Margaree Mackintosh, MD  carvedilol (COREG) 12.5 MG tablet TAKE 1 TABLET(12.5 MG) BY MOUTH TWICE DAILY WITH A MEAL 12/13/20   Margaree Mackintosh, MD  cetirizine (ZYRTEC) 10 MG tablet Take 1 tablet (10 mg total) by mouth daily. 09/25/19   Dahlia Byes A, NP  cholecalciferol (VITAMIN D) 1000 UNITS tablet Take 1,000 Units by mouth daily.    [provider]  cyclobenzaprine (FLEXERIL) 10 MG tablet TAKE 1 TABLET(10 MG) BY MOUTH AT BEDTIME 10/12/17   Margaree Mackintosh, MD  fluticasone (FLONASE) 50 MCG/ACT nasal spray Place 1 spray into both nostrils daily. 09/25/19   Dahlia Byes A, NP  furosemide (LASIX) 40 MG tablet TAKE 1 TABLET(40 MG) BY MOUTH DAILY 12/13/20   Margaree Mackintosh, MD  glucose blood (ACCU-CHEK GUIDE) test strip  Use as instructed 03/03/21   Margaree Mackintosh, MD  glucose blood test strip Use as instructed 07/16/13   Margaree Mackintosh, MD  glucose monitoring kit (FREESTYLE) monitoring kit 1 each by Does not apply route as needed for other. 07/16/13   Margaree Mackintosh, MD  HYDROcodone bit-homatropine (HYCODAN) 5-1.5 MG/5ML syrup Take 5 mLs by mouth every 8 (eight) hours as needed for cough. 02/28/21   Margaree Mackintosh, MD  levonorgestrel (MIRENA) 20 MCG/24HR IUD 1 each by Intrauterine route once.    [provider]  metFORMIN (GLUCOPHAGE-XR) 750 MG 24 hr tablet TAKE 1 TABLET(750 MG) BY MOUTH DAILY WITH BREAKFAST 12/13/20   Margaree Mackintosh, MD  Multiple Vitamin (MULTIVITAMIN WITH MINERALS) TABS tablet Take 1 tablet by mouth daily.    [provider]  olmesartan (BENICAR) 40 MG tablet Take 1 tablet (40 mg total) by mouth daily. 12/13/20   Margaree Mackintosh, MD  Omega-3 Fatty Acids (FISH OIL) 1000 MG CAPS Take 1 capsule by mouth  daily.    [provider]  potassium chloride SA (KLOR-CON M20) 20 MEQ tablet TAKE 1 TABLET(20 MEQ) BY MOUTH DAILY 03/05/21   Margaree Mackintosh, MD  sertraline (ZOLOFT) 50 MG tablet TAKE 1 TABLET BY MOUTH EVERY DAY 01/04/21   Margaree Mackintosh, MD  simvastatin (ZOCOR) 40 MG tablet Take 1 tablet (40 mg total) by mouth at bedtime. 12/13/20   Margaree Mackintosh, MD  sitaGLIPtin (JANUVIA) 100 MG tablet Take 1 tablet (100 mg total) by mouth daily. 02/28/21   Margaree Mackintosh, MD  buPROPion (WELLBUTRIN SR) 150 MG 12 hr tablet Take 1 tablet (150 mg total) by mouth daily. 01/08/19 09/25/19  Margaree Mackintosh, MD    Family History Family History  Problem Relation Age of Onset   Arthritis Mother    Heart disease Mother    Hyperlipidemia Mother    Hypertension Father    Diabetes Father    Obesity Father     Social History Social History   Tobacco Use   Smoking status: Never   Smokeless tobacco: Never  Substance Use Topics   Alcohol use: No   Drug use: No     Allergies   Patient has no known allergies.   Review of Systems Review of Systems  Constitutional: Negative.   HENT:  Positive for congestion, ear pain, sinus pressure, sinus pain and sore throat. Negative for dental problem, drooling, ear discharge, facial swelling, hearing loss, mouth sores, nosebleeds, postnasal drip, rhinorrhea, sneezing, tinnitus, trouble swallowing and voice change.   Respiratory:  Positive for cough. Negative for apnea, choking, chest tightness, shortness of breath, wheezing and stridor.   Cardiovascular: Negative.   Gastrointestinal: Negative.   Skin: Negative.   Neurological:  Positive for headaches. Negative for dizziness, tremors, seizures, syncope, facial asymmetry, speech difficulty, weakness, light-headedness and numbness.    Physical Exam Triage Vital Signs ED Triage Vitals  Enc Vitals Group     BP 07/22/21 0836 (!) 186/127     Pulse Rate 07/22/21 0836 92     Resp 07/22/21 0836 19     Temp 07/22/21  0836 98.4 F (36.9 C)     Temp src --      SpO2 07/22/21 0836 95 %     Weight --      Height --      Head Circumference --      Peak Flow --      Pain Score 07/22/21 0835 0  Pain Loc --      Pain Edu? --      Excl. in [place]? --    No data found.  Updated Vital Signs BP (!) 186/127   Pulse 92   Temp 98.4 F (36.9 C)   Resp 19   SpO2 95%   Visual Acuity Right Eye Distance:   Left Eye Distance:   Bilateral Distance:    Right Eye Near:   Left Eye Near:    Bilateral Near:     Physical Exam Constitutional:      Appearance: Normal appearance.  HENT:     Head: Normocephalic.     Right Ear: Hearing, ear canal and external ear normal. A middle ear effusion is present.     Left Ear: Hearing, ear canal and external ear normal. A middle ear effusion is present.     Nose: Congestion and rhinorrhea present.     Mouth/Throat:     Mouth: Mucous membranes are moist.     Pharynx: Oropharynx is clear.  Eyes:     Extraocular Movements: Extraocular movements intact.  Cardiovascular:     Rate and Rhythm: Normal rate and regular rhythm.     Pulses: Normal pulses.     Heart sounds: Normal heart sounds.  Pulmonary:     Effort: Pulmonary effort is normal.     Breath sounds: Normal breath sounds.  Musculoskeletal:     Cervical back: Normal range of motion and neck supple.  Lymphadenopathy:     Cervical: Cervical adenopathy present.  Skin:    General: Skin is warm and dry.  Neurological:     Mental Status: She is alert and oriented to person, place, and time. Mental status is at baseline.  Psychiatric:        Mood and Affect: Mood normal.        Behavior: Behavior normal.     UC Treatments / Results  Labs (all labs ordered are listed, but only abnormal results are displayed) Labs Reviewed - No data to display  EKG   Radiology No results found.  Procedures Procedures (including critical care time)  Medications Ordered in UC Medications - No data to  display  Initial Impression / Assessment and Plan / UC Course  I have reviewed the triage vital signs and the nursing notes.  Pertinent labs & imaging results that were available during my care of the patient were reviewed by me and considered in my medical decision making (see chart for details).  Acute nonrecurrent maxillary sinusitis Elevated blood pressure reading in office with diagnosis of hypertension  Patient most likely has a sinus infection caused by a virus, discussed etiology, timeline and possible resolution, advised beginning use of Mucinex and Zyrtec for management of congestion, endorses that she has Flonase at home, recommended restarting medication, will place watchful wait antibiotic pharmacy for day 7 of illness, prescribed Augmentin for 10-day course, may also try saline irrigation and increase fluid intake for further management, may follow-up with urgent care as needed  Patient blood pressure elevated at 186/127 in triage, endorses that she stopped taking her 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   Valinda Hoar, [place] 07/22/21 [phone]

## 2021-07-22 NOTE — ED Triage Notes (Addendum)
Pt presents with complaints of facial pressure, nasal drainage, chest congestion, cough, soreness from cough x 3 days. Reports cough has made throat scratchy and left ear pain. Pt is hypertensive during intake. Reports not taking her bp medication like she should and feeling stress at home.  ?

## 2021-12-26 ENCOUNTER — Ambulatory Visit: Payer: 59 | Admitting: Internal Medicine

## 2021-12-26 ENCOUNTER — Other Ambulatory Visit: Payer: Self-pay | Admitting: Internal Medicine

## 2021-12-26 ENCOUNTER — Encounter: Payer: Self-pay | Admitting: Internal Medicine

## 2021-12-26 VITALS — BP 168/100 | HR 79 | Temp 98.9°F | Wt 263.4 lb

## 2021-12-26 DIAGNOSIS — I10 Essential (primary) hypertension: Secondary | ICD-10-CM

## 2021-12-26 DIAGNOSIS — H6693 Otitis media, unspecified, bilateral: Secondary | ICD-10-CM

## 2021-12-26 DIAGNOSIS — F4320 Adjustment disorder, unspecified: Secondary | ICD-10-CM

## 2021-12-26 DIAGNOSIS — E1165 Type 2 diabetes mellitus with hyperglycemia: Secondary | ICD-10-CM

## 2021-12-26 DIAGNOSIS — E119 Type 2 diabetes mellitus without complications: Secondary | ICD-10-CM

## 2021-12-26 DIAGNOSIS — J069 Acute upper respiratory infection, unspecified: Secondary | ICD-10-CM

## 2021-12-26 DIAGNOSIS — F4321 Adjustment disorder with depressed mood: Secondary | ICD-10-CM | POA: Diagnosis not present

## 2021-12-26 MED ORDER — SIMVASTATIN 40 MG PO TABS: 40.0000 mg | ORAL_TABLET | Freq: Every day | ORAL | 3 refills | Status: DC

## 2021-12-26 MED ORDER — OLMESARTAN MEDOXOMIL 40 MG PO TABS: 40.0000 mg | ORAL_TABLET | Freq: Every day | ORAL | 0 refills | Status: DC

## 2021-12-26 MED ORDER — CARVEDILOL 12.5 MG PO TABS: 12.5000 mg | ORAL_TABLET | Freq: Two times a day (BID) | ORAL | 3 refills | Status: DC

## 2021-12-26 MED ORDER — AMOXICILLIN 500 MG PO CAPS: 500.0000 mg | ORAL_CAPSULE | Freq: Three times a day (TID) | ORAL | 0 refills | Status: AC

## 2021-12-26 MED ORDER — FUROSEMIDE 40 MG PO TABS: ORAL_TABLET | ORAL | 3 refills | Status: DC

## 2021-12-26 MED ORDER — NEOMYCIN-POLYMYXIN-HC 3.5-10000-1 OT SOLN: OTIC | 0 refills | Status: DC

## 2021-12-26 MED ORDER — AMLODIPINE BESYLATE 5 MG PO TABS: 5.0000 mg | ORAL_TABLET | Freq: Every day | ORAL | 0 refills | Status: DC

## 2021-12-26 MED ORDER — SITAGLIPTIN PHOSPHATE 100 MG PO TABS: 100.0000 mg | ORAL_TABLET | Freq: Every day | ORAL | 0 refills | Status: DC

## 2021-12-26 NOTE — Patient Instructions (Addendum)
Have refilled amlodipine, Carvidilol, Lasix and olmesartan.  Start Onglyza 5 mg daily for diabetes.  Follow-up in 3 to 4 weeks..  Will need hemoglobin A1c and b-met at that time.  Take amoxicillin 500 mg 3 times a day for 10 days.  Refilled statin medication and antihypertensive medications.  Use Cortisporin suspension in left ear 4 times a day

## 2021-12-26 NOTE — Progress Notes (Signed)
   Subjective:    Patient ID: Crystal Wallace, female    DOB: Aug 25, 1975, 46 y.o.   MRN: [id number]  HPI 46 year old obese Female with history of hypertension and type II diabetes mellitus has not been seen here since October 2022.  In March of this year she went to the [facility] urgent care center with nasal congestion ear pain and sinus pressure along with cough and sore throat.  She had a right middle ear effusion and a left middle ear effusion with congestion of the nasal passages and rhinorrhea.  Was told that she could take Augmentin if no improvement with conservative measures after 7 days.  Had run out of blood pressure medications at that time.  She continues to work full-time.  Her father passed away in the Fall 2022 with multiple medical issues.  Still grieving father's passing.  She is not taking her medications for diabetes or hypertension.  She remains morbidly obese.    Review of Systems has malaise and fatigue and respiratory congestion     Objective:   Physical Exam Labs are not drawn today.  She will plan to have hemoglobin A1c, lipid panel and liver functions on September in 4 weeks.  Morbidly obese female seen visibly upset when speaking about her father's illness and passing blood pressure is 170/120 and was rechecked some 20 minutes later and was 168/100.  Her BMI is 45.21.  Pulse oximetry 99% and pulse is 79 and regular.  She is afebrile.  Left TM is full and dull without external ear drainage or TM perforation.  Right TM is slightly full.  Pharynx is clear.  Neck is supple.  Cardiac exam: Regular rate and rhythm without ectopy.  Chest is clear to auscultation without rales or wheezing.     Assessment & Plan:  Grief reaction with prolonged bereavement-father passed in the Fall 2022 and she is continues to grieve  Morbid obesity-needs to work on diet  Type 2 diabetes mellitus-have prescribed Onglyza.  She does not like taking metformin  Hypertension-blood pressure not  under good control but she is not taking antihypertensive medications  Acute respiratory infection/otitis media-treated with amoxicillin 500 mg 3 times a day for 10 days  Hyperlipidemia-refill simvastatin 40 mg daily.  She has history of mixed hyperlipidemia and low HDL in the 40s  Plan: Today we have refilled amlodipine 5 mg daily, carvedilol 12.5 mg twice daily, Lasix 40 mg daily and olmesartan 40 mg daily.  She says she has Monday to get her medications.  I offered her some assistance if she did not have means to get her medicines.  She will get back on her diabetic medications and have prescribed today Onglyza 5 mg daily.  She previously has been on metformin but does not like it very much.  Plan to draw hemoglobin A1c lipid panel and liver functions in early September.  Did not refill metformin.  Need to follow-up in 3 to 4 weeks with office visit, lipid panel, liver functions and hemoglobin A1c.  She does not want to take metformin.  Use Cortisporin suspension and left ear 4 times a day.

## 2022-01-30 ENCOUNTER — Encounter: Payer: Self-pay | Admitting: Internal Medicine

## 2022-02-06 ENCOUNTER — Other Ambulatory Visit: Payer: 59

## 2022-02-06 DIAGNOSIS — E781 Pure hyperglyceridemia: Secondary | ICD-10-CM

## 2022-02-06 DIAGNOSIS — E119 Type 2 diabetes mellitus without complications: Secondary | ICD-10-CM

## 2022-02-06 DIAGNOSIS — E782 Mixed hyperlipidemia: Secondary | ICD-10-CM

## 2022-02-07 LAB — LIPID PANEL
Cholesterol: 177 mg/dL (ref <200)
HDL: 45 mg/dL — ABNORMAL LOW (ref >=50)
LDL Cholesterol (Calc): 92 mg/dL (calc)
Non-HDL Cholesterol (Calc): 132 mg/dL (calc) — ABNORMAL HIGH (ref <130)
Total CHOL/HDL Ratio: 3.9 (calc) (ref <5.0)
Triglycerides: 279 mg/dL — ABNORMAL HIGH (ref <150)

## 2022-02-07 LAB — BASIC METABOLIC PANEL
BUN: 11 mg/dL (ref 7–25)
CO2: 25 mmol/L (ref 20–32)
Calcium: 9.1 mg/dL (ref 8.6–10.2)
Chloride: 97 mmol/L — ABNORMAL LOW (ref 98–110)
Creat: 0.58 mg/dL (ref 0.50–0.99)
Glucose, Bld: 332 mg/dL — ABNORMAL HIGH (ref 65–99)
Potassium: 4 mmol/L (ref 3.5–5.3)
Sodium: 138 mmol/L (ref 135–146)

## 2022-02-07 LAB — HEMOGLOBIN A1C
Hgb A1c MFr Bld: 11.7 % of total Hgb — ABNORMAL HIGH (ref <5.7)
Mean Plasma Glucose: 289 mg/dL
eAG (mmol/L): 16 mmol/L

## 2022-02-07 LAB — MICROALBUMIN, URINE: Microalb, Ur: 9.4 mg/dL

## 2022-02-09 ENCOUNTER — Telehealth (HOSPITAL_COMMUNITY): Payer: Self-pay | Admitting: Emergency Medicine

## 2022-02-09 ENCOUNTER — Encounter (HOSPITAL_COMMUNITY): Payer: Self-pay | Admitting: Emergency Medicine

## 2022-02-09 ENCOUNTER — Ambulatory Visit (HOSPITAL_COMMUNITY)
Admission: EM | Admit: 2022-02-09 | Discharge: 2022-02-09 | Disposition: A | Discharge status: Home / Self Care | Payer: 59 | Attending: Family Medicine | Admitting: Family Medicine

## 2022-02-09 ENCOUNTER — Other Ambulatory Visit: Payer: Self-pay

## 2022-02-09 ENCOUNTER — Encounter: Payer: Self-pay | Admitting: Internal Medicine

## 2022-02-09 DIAGNOSIS — U071 COVID-19: Secondary | ICD-10-CM | POA: Insufficient documentation

## 2022-02-09 DIAGNOSIS — J069 Acute upper respiratory infection, unspecified: Secondary | ICD-10-CM

## 2022-02-09 DIAGNOSIS — R059 Cough, unspecified: Secondary | ICD-10-CM | POA: Diagnosis present

## 2022-02-09 DIAGNOSIS — I10 Essential (primary) hypertension: Secondary | ICD-10-CM | POA: Diagnosis not present

## 2022-02-09 DIAGNOSIS — E119 Type 2 diabetes mellitus without complications: Secondary | ICD-10-CM | POA: Diagnosis not present

## 2022-02-09 DIAGNOSIS — R519 Headache, unspecified: Secondary | ICD-10-CM | POA: Diagnosis present

## 2022-02-09 LAB — RESP PANEL BY RT-PCR (FLU A&B, COVID) ARPGX2
Influenza A by PCR: NEGATIVE
Influenza B by PCR: NEGATIVE
SARS Coronavirus 2 by RT PCR: POSITIVE — AB

## 2022-02-09 MED ORDER — NIRMATRELVIR/RITONAVIR (PAXLOVID)TABLET: 3.0000 | ORAL_TABLET | Freq: Two times a day (BID) | ORAL | 0 refills | Status: AC

## 2022-02-09 MED ORDER — BENZONATATE 100 MG PO CAPS: 100.0000 mg | ORAL_CAPSULE | Freq: Three times a day (TID) | ORAL | 0 refills | Status: DC | PRN

## 2022-02-09 NOTE — Discharge Instructions (Addendum)
Take benzonatate 100 mg, 1 tab every 8 hours as needed for cough.  Phenylephrine and pseudoephedrine are both decongestants and can raise your blood pressure.  If you see that either of these medicines are in the over-the-counter medication you have been taking, I would stop taking them.  The Coricidin is okay to take.  You may also get Mucinex or Robitussin plain take as needed for congestion   You have been swabbed for COVID and flu, and the test will result in the next 24 hours. Our staff will call you if positive. If the COVID test is positive, you should quarantine for 5 days from the start of your symptoms

## 2022-02-09 NOTE — ED Triage Notes (Signed)
Symptoms started yesterday.  Cough, congestion, left ear pain, headache, runny nose reports brown-yellow phlegm.    Has not done a covid test.    Coricidin, Vicks sinus max with Mucinex

## 2022-02-09 NOTE — ED Provider Notes (Signed)
[facility]    CSN: [id number] Arrival date & time: 02/09/22  0258      History   Chief Complaint Chief Complaint  Patient presents with   URI    HPI Crystal Wallace is a 46 y.o. female.    URI  Here for cough and congestion that began yesterday.  She has had some headache and has had some chills though she has not documented any fever.  No nausea, vomiting, or diarrhea.  She does have a history of diabetes, and has not been taking as good care of her condition in the last few months due to a death in her family.  Last creatinine was normal a few days ago at 0.58.    Past Medical History:  Diagnosis Date   Diabetes ([place])    HTN (hypertension)    Hx of blood clots    Hyperlipidemia    Lactose intolerance    Vitamin D deficiency     Patient Active Problem List   Diagnosis Date Noted   Poorly controlled diabetes mellitus ([place]) 12/26/2021   Vitamin D deficiency 01/15/2018   History of iron deficiency 07/05/2015   Type 2 diabetes mellitus without complication ([place]) 52/77/8242   Morbid obesity ([place]) 07/05/2015   Resistant hypertension 12/04/2011   Mixed hyperlipidemia 12/04/2011    Past Surgical History:  Procedure Laterality Date    c section  1999   adenoid removal age 45     cervical 83 and cervical 6 neck surgery  12-07-2010   ROBOT ASSISTED MYOMECTOMY  10/20/2011   Procedure: ROBOTIC ASSISTED MYOMECTOMY;  Surgeon: Lahoma Crocker, MD;  Location: [facility];  Service: Gynecology;  Laterality: N/A;    OB History     Gravida  1   Para      Term      Preterm      AB      Living  1      SAB      IAB      Ectopic      Multiple      Live Births               Home Medications    Prior to Admission medications   Medication Sig Start Date End Date Taking? Authorizing Provider  benzonatate (TESSALON) 100 MG capsule Take 1 capsule (100 mg total) by mouth 3 (three) times daily as needed for cough. 02/09/22  Yes Barrett Henle,  MD  Accu-Chek Softclix Lancets lancets Use as instructed Patient not taking: Reported on 12/26/2021 03/03/21   Elby Showers, MD  acetaminophen (TYLENOL) 500 MG tablet Take 1,000 mg by mouth at bedtime as needed for mild pain.    [provider]  amLODipine (NORVASC) 5 MG tablet Take 1 tablet (5 mg total) by mouth daily. 12/26/21   Elby Showers, MD  blood glucose meter kit and supplies KIT Dispense based on patient and insurance preference. Use up to four times daily as directed. (FOR ICD-9 250.00, 250.01). Patient not taking: Reported on 12/26/2021 03/01/21   Elby Showers, MD  carvedilol (COREG) 12.5 MG tablet Take 1 tablet (12.5 mg total) by mouth 2 (two) times daily with a meal. 12/26/21   Baxley, Cresenciano Lick, MD  cetirizine (ZYRTEC) 10 MG tablet Take 1 tablet (10 mg total) by mouth daily. Patient not taking: Reported on 02/09/2022 07/22/21   Hans Eden, NP  fluticasone (FLONASE) 50 MCG/ACT nasal spray Place 1 spray into both  nostrils daily. Patient not taking: Reported on 02/09/2022 09/25/19   Loura Halt A, NP  furosemide (LASIX) 40 MG tablet TAKE 1 TABLET(40 MG) BY MOUTH DAILY 12/26/21   Elby Showers, MD  glucose blood (ACCU-CHEK GUIDE) test strip Use as instructed Patient not taking: Reported on 12/26/2021 03/03/21   Elby Showers, MD  glucose blood test strip Use as instructed Patient not taking: Reported on 12/26/2021 07/16/13   Elby Showers, MD  glucose monitoring kit (FREESTYLE) monitoring kit 1 each by Does not apply route as needed for other. 07/16/13   Elby Showers, MD  levonorgestrel (MIRENA) 20 MCG/24HR IUD 1 each by Intrauterine route once.    [provider]  Multiple Vitamin (MULTIVITAMIN WITH MINERALS) TABS tablet Take 1 tablet by mouth daily.    [provider]  olmesartan (BENICAR) 40 MG tablet Take 1 tablet (40 mg total) by mouth daily. 12/26/21   Elby Showers, MD  saxagliptin HCl (ONGLYZA) 5 MG TABS tablet Take 1 tablet (5 mg total) by mouth  daily. 12/26/21   Elby Showers, MD  simvastatin (ZOCOR) 40 MG tablet Take 1 tablet (40 mg total) by mouth at bedtime. 12/26/21   Elby Showers, MD  buPROPion (WELLBUTRIN SR) 150 MG 12 hr tablet Take 1 tablet (150 mg total) by mouth daily. 01/08/19 09/25/19  Elby Showers, MD    Family History Family History  Problem Relation Age of Onset   Arthritis Mother    Heart disease Mother    Hyperlipidemia Mother    Hypertension Father    Diabetes Father    Obesity Father     Social History Social History   Tobacco Use   Smoking status: Never   Smokeless tobacco: Never  Vaping Use   Vaping Use: Never used  Substance Use Topics   Alcohol use: No   Drug use: No     Allergies   Patient has no known allergies.   Review of Systems Review of Systems   Physical Exam Triage Vital Signs ED Triage Vitals  Enc Vitals Group     BP 02/09/22 1103 (!) 164/97     Pulse Rate 02/09/22 1103 91     Resp 02/09/22 1103 (!) 22     Temp 02/09/22 1103 99.1 F (37.3 C)     Temp Source 02/09/22 1103 Oral     SpO2 02/09/22 1103 95 %     Weight --      Height --      Head Circumference --      Peak Flow --      Pain Score 02/09/22 1059 10     Pain Loc --      Pain Edu? --      Excl. in [place]? --    No data found.  Updated Vital Signs BP (!) 164/97 (BP Location: Left Arm) Comment: large cuff/has NOT had medicine today  Pulse 91   Temp 99.1 F (37.3 C) (Oral)   Resp (!) 22   SpO2 95%   Visual Acuity Right Eye Distance:   Left Eye Distance:   Bilateral Distance:    Right Eye Near:   Left Eye Near:    Bilateral Near:     Physical Exam Vitals reviewed.  Constitutional:      General: She is not in acute distress.    Appearance: She is not toxic-appearing.  HENT:     Right Ear: Tympanic membrane and ear canal normal.  Left Ear: Tympanic membrane and ear canal normal.     Nose: Nose normal.     Mouth/Throat:     Mouth: Mucous membranes are moist.     Comments: There is no  erythema, but there is clear mucus draining Eyes:     Extraocular Movements: Extraocular movements intact.     Conjunctiva/sclera: Conjunctivae normal.     Pupils: Pupils are equal, round, and reactive to light.  Cardiovascular:     Rate and Rhythm: Normal rate and regular rhythm.     Heart sounds: No murmur heard. Pulmonary:     Effort: Pulmonary effort is normal. No respiratory distress.     Breath sounds: No stridor. No wheezing, rhonchi or rales.  Musculoskeletal:     Cervical back: Neck supple.  Lymphadenopathy:     Cervical: No cervical adenopathy.  Skin:    Capillary Refill: Capillary refill takes less than 2 seconds.     Coloration: Skin is not jaundiced or pale.  Neurological:     General: No focal deficit present.     Mental Status: She is alert and oriented to person, place, and time.  Psychiatric:        Behavior: Behavior normal.      UC Treatments / Results  Labs (all labs ordered are listed, but only abnormal results are displayed) Labs Reviewed  RESP PANEL BY RT-PCR (FLU A&B, COVID) [id number]    EKG   Radiology No results found.  Procedures Procedures (including critical care time)  Medications Ordered in UC Medications - No data to display  Initial Impression / Assessment and Plan / UC Course  I have reviewed the triage vital signs and the nursing notes.  Pertinent labs & imaging results that were available during my care of the patient were reviewed by me and considered in my medical decision making (see chart for details).        We will swab for COVID, and if she is positive she is a candidate for Paxlovid.  Her last creatinine was normal so her estimated GFR should be over 60  I am also swabbing her for flu with the PCR, and if she is positive for that instead she is a candidate for Tamiflu.  I want to do the flu test since she is immunosuppressed with her diabetes and may not mount a fever response to the flu. Final Clinical Impressions(s)  / UC Diagnoses   Final diagnoses:  Viral URI with cough     Discharge Instructions      Take benzonatate 100 mg, 1 tab every 8 hours as needed for cough.  Phenylephrine and pseudoephedrine are both decongestants and can raise your blood pressure.  If you see that either of these medicines are in the over-the-counter medication you have been taking, I would stop taking them.  The Coricidin is okay to take.  You may also get Mucinex or Robitussin plain take as needed for congestion   You have been swabbed for COVID and flu, and the test will result in the next 24 hours. Our staff will call you if positive. If the COVID test is positive, you should quarantine for 5 days from the start of your symptoms      ED Prescriptions     Medication Sig Dispense Auth. Provider   benzonatate (TESSALON) 100 MG capsule Take 1 capsule (100 mg total) by mouth 3 (three) times daily as needed for cough. 21 capsule Barrett Henle, MD  PDMP not reviewed this encounter.   Barrett Henle, MD 02/09/22 1121

## 2022-03-23 ENCOUNTER — Other Ambulatory Visit: Payer: Self-pay | Admitting: Internal Medicine

## 2022-03-23 NOTE — Telephone Encounter (Signed)
Spoke with patient and she stated she has enough meds for approximally 22 days,  also stated she has a cough that hasn't gone away since having Covid. Patient is scheduled 11/17

## 2022-03-24 ENCOUNTER — Ambulatory Visit: Payer: 59 | Admitting: Internal Medicine

## 2022-03-24 ENCOUNTER — Encounter: Payer: Self-pay | Admitting: Internal Medicine

## 2022-03-24 VITALS — BP 136/84 | HR 82 | Temp 98.9°F

## 2022-03-24 DIAGNOSIS — I10 Essential (primary) hypertension: Secondary | ICD-10-CM | POA: Diagnosis not present

## 2022-03-24 DIAGNOSIS — E119 Type 2 diabetes mellitus without complications: Secondary | ICD-10-CM | POA: Diagnosis not present

## 2022-03-24 DIAGNOSIS — E1165 Type 2 diabetes mellitus with hyperglycemia: Secondary | ICD-10-CM

## 2022-03-24 DIAGNOSIS — J22 Unspecified acute lower respiratory infection: Secondary | ICD-10-CM | POA: Diagnosis not present

## 2022-03-24 DIAGNOSIS — E781 Pure hyperglyceridemia: Secondary | ICD-10-CM

## 2022-03-24 LAB — GLUCOSE, POCT (MANUAL RESULT ENTRY): POC Glucose: 388 mg/dl — AB (ref 70–99)

## 2022-03-24 MED ORDER — BUPROPION HCL ER (SR) 150 MG PO TB12: 150.0000 mg | ORAL_TABLET | Freq: Two times a day (BID) | ORAL | 5 refills | Status: DC

## 2022-03-24 MED ORDER — SAXAGLIPTIN HCL 5 MG PO TABS: 5.0000 mg | ORAL_TABLET | Freq: Every day | ORAL | 5 refills | Status: DC

## 2022-03-24 NOTE — Progress Notes (Signed)
   Subjective:    Patient ID: Crystal Wallace, female    DOB: 1975-05-19, 46 y.o.   MRN: [id number]  HPI Had Covid for the second time around early October 5.  Was seen at [facility] and given Paxlovid.  Has improved but still has protracted cough.   Patient has history of type 2 diabetes mellitus non-insulin-dependent.  Hemoglobin A1c in early October 2023 was 11.7%.  We are going to try her on Onglyza 5 mg daily.  She does not want to be on insulin.  She has a history of hypertension.  History of hyperlipidemia treated with Zocor.  She needs referral to Endocrinology for poorly controlled diabetes.  She is  interested Dexcom glucose monitor.  History of depression surrounding the loss of her father.  She had COVID-19 in February 2021.  She tested positive through her workplace and [place].  We treated her with Zithromax at that time.  She was out of work for about 2 weeks.  She has morbid obesity.  Not really motivated to diet and exercise.  In October, her triglycerides were significantly elevated at 279 increased from 168 in September 2022.  She has history of low HDL cholesterol.  Total cholesterol in October was 177 and LDL cholesterol was 92.  However glucose was 332 that day and hemoglobin A1c was 11.7%.  This is concerning.  In the Fall  of 2022, her father was ill in the hospital with cardiac issues and had stents placed.  He subsequently passed away.  This was devastating for her.  She saw Dr. [name] Wallace regarding resistant hypertension in 2019.  She was going to healthy weight clinic around in 2019 with Dr. Leafy Ro.  At that time her weight was 279 pounds.  At that time, she was on metformin 750 mg in 24 hours.  She was on simvastatin 40 mg daily and Lasix 40 mg daily along with carvedilol 12.5 mg twice daily  History of vitamin D deficiency and took high-dose vitamin D while going to weight loss center.  Review of Systems no fever, no N or V. No headache, no  chills,     Objective:   Physical Exam Blood pressure 130/84, pulse 82 regular, temperature 98.9 degrees, pulse oximetry 98% Skin: Warm and dry.  No cervical adenopathy.  Chest clear.  Cardiac exam: Regular rate and rhythm.  TMs and pharynx are clear.      Assessment & Plan:  Persistent cough status post COVID-19 in October.  Received Paxlovid at the time..  Does not want antibiotics.  Steroids will raise her glucose.  Type 2 diabetes mellitus-non-insulin-dependent at this time-poorly controlled- try Onglyza.  Needs referral to Endocrinology.  I can place an order for Dexcom monitor but not sure insurance will cover it.  Endocrinology may be able to help with this.  Bereavement and depression-is on Wellbutrin which will not cause weight gain  Hypertension- continue amlodipine, Lasix and carvedilol  Morbid obesity  Mixed hyperlipidemia-treated with Zocor 40 mg daily  Essential hypertension  Morbid obesity  Plan: Try Onglyza 5 mg daily for Diabetes mellitus.  Referral to Endocrinology.  May need assistance getting Dexcom monitor.  Needs diabetic teaching and education.  Needs to be motivated to take care of herself.

## 2022-04-05 ENCOUNTER — Encounter: Payer: Self-pay | Admitting: Gastroenterology

## 2022-04-05 ENCOUNTER — Other Ambulatory Visit: Payer: Self-pay | Admitting: Internal Medicine

## 2022-04-07 ENCOUNTER — Ambulatory Visit: Payer: 59 | Admitting: Internal Medicine

## 2022-04-07 ENCOUNTER — Telehealth: Payer: Self-pay | Admitting: Internal Medicine

## 2022-04-07 ENCOUNTER — Encounter: Payer: Self-pay | Admitting: Internal Medicine

## 2022-04-07 VITALS — BP 146/88 | HR 91 | Temp 99.0°F

## 2022-04-07 DIAGNOSIS — R0989 Other specified symptoms and signs involving the circulatory and respiratory systems: Secondary | ICD-10-CM | POA: Diagnosis not present

## 2022-04-07 DIAGNOSIS — F4321 Adjustment disorder with depressed mood: Secondary | ICD-10-CM

## 2022-04-07 DIAGNOSIS — I10 Essential (primary) hypertension: Secondary | ICD-10-CM | POA: Diagnosis not present

## 2022-04-07 DIAGNOSIS — J069 Acute upper respiratory infection, unspecified: Secondary | ICD-10-CM | POA: Diagnosis not present

## 2022-04-07 DIAGNOSIS — G4483 Primary cough headache: Secondary | ICD-10-CM

## 2022-04-07 DIAGNOSIS — E1165 Type 2 diabetes mellitus with hyperglycemia: Secondary | ICD-10-CM

## 2022-04-07 DIAGNOSIS — E119 Type 2 diabetes mellitus without complications: Secondary | ICD-10-CM

## 2022-04-07 MED ORDER — BENZONATATE 100 MG PO CAPS: 100.0000 mg | ORAL_CAPSULE | Freq: Three times a day (TID) | ORAL | 0 refills | Status: DC | PRN

## 2022-04-07 MED ORDER — HYDROCODONE BIT-HOMATROP MBR 5-1.5 MG/5ML PO SOLN: 5.0000 mL | Freq: Three times a day (TID) | ORAL | 0 refills | Status: DC | PRN

## 2022-04-07 MED ORDER — AZITHROMYCIN 250 MG PO TABS: ORAL_TABLET | ORAL | 0 refills | Status: AC

## 2022-04-07 NOTE — Progress Notes (Signed)
Subjective:    Patient ID: Crystal Wallace, female    DOB: 1976-01-19, 46 y.o.   MRN: [id number]  HPI 46 year old Female seen with headache, ear pain, cough, head and chest congestion and sneezing.  Symptoms started last evening.  Patient did a home COVID test today at request prior to coming to the office.  Results were reported as negative.  Patient has history of diabetes mellitus and morbid obesity.  She also has essential hypertension and hyperlipidemia.  She was last here in mid November regarding depression and type 2 diabetes mellitus.  She lost her father several months ago and misses him dearly.  Her glucose in the office in November was 388.  She said she would like to have a Dexcom meter.  We wrote a prescription but she tells me today that for some reason she did not qualify for it.  We will see if [facility] can help her out with this.  She has been on oral medications and continues to struggle with glucose control.  She has been on Onglyza but it simply not working for her.  She needs to be seen by Endocrinology as well.  She has been reluctant to see Endocrinologist.  She has a history of anxiety and depression. Her triglycerides were elevated in October at 279.  She has a low HDL of 45.  Total cholesterol in October was 177 with an LDL cholesterol of 92.  Hemoglobin A1c in October was 11.7%.  I am quite concerned about her.  She has not wanted to go to counseling for grief.  Has not wanted to be on antidepressant other than Wellbutrin.  History of essential hypertension treated with carvedilol and amlodipine as well has olmesartan.  She had COVID-19 and was treated with Paxlovid in October.  Has been vaccinated for pneumococcal disease with pneumococcal 13 and pneumococcal 23.  Received flu vaccine October 6.  Past medical history: Adenoids removed at age 50.  C-section 1999.  Robotic assisted myomectomy 2013.  Cervical disc surgery in 2012 C5 and C6.  Social history: She is single.  She has a  high school education.  She did attend college but did not graduate.  She has 1 daughter.  Does not smoke or consume alcohol.  Family history: Both parents with history of hypertension.  Mother with history of hyperlipidemia.  Father had diabetes and kidney stones.  Father had history of CABG.  Maternal grandmother with history of MI in her 44s.  Brother and sister fairly healthy.  History of iron deficiency anemia related to uterine fibroids and menorrhagia.  In June 2013 she underwent a robotic assisted myomectomy.  She did see Dr. [name] Wallace in 2019 for resistant hypertension.  Patient told Dr. [name] Wallace she started blood pressure medications in 1999 after the birth of her daughter.  She stopped taking it for a number of years and started back around the time of her myomectomy.   Review of Systems see above     Objective:   Physical Exam  Vital signs reviewed.  She looks fatigued.  Blood pressure 150/92 with large cuff, pulse 91 regular, temperature 99 degrees by ear thermometer pulse oximetry 96% on room air repeat blood pressure check today was 146/88 with large cuff TMs lightly full bilaterally.  Pharynx clear.  Neck supple.  Chest clear to auscultation.     Assessment & Plan:  Type 2 diabetes mellitus-referral to [facility] Endocrinology.  Order previously written for Dexcom meter.  She says she  does not qualify for some reason.  We may need to get [facility] to help with this.  Grief reaction secondary to losing father.  Does not want to go to counseling.  Is on Wellbutrin.  I think SSRI might contribute to her weight gain.  Acute upper respiratory infection-treat with Zithromax Z-PAK 2 tabs day 1 followed by 1 tab days 2 through 5.  Rest and stay well-hydrated.  Morbid obesity  Essential hypertension-treated with carvedilol, olmesartan and amlodipine  Hyperlipidemia treated with simvastatin 40 mg daily  Acute respiratory infection  Plan: Hycodan 1 teaspoon every 8 hours as needed  for cough.  Zithromax Z-PAK 2 tabs day 1 followed by 1 tab days 2 through 5.  Rest and stay well-hydrated.  Watch Accu-Cheks.  Encourage patient to keep appointment with [facility] Endocrinology.

## 2022-04-07 NOTE — Telephone Encounter (Signed)
The COVID test was negative. Scheduled for 12:00, ask to wear a mask

## 2022-04-07 NOTE — Telephone Encounter (Signed)
Jennalynn Rivard [phone]  Crystal Wallace called to say she has Headache,ears hurting, cough, head and chest congestion, sneezing all started last night. I ask her to do COVID test and call back.

## 2022-04-15 ENCOUNTER — Other Ambulatory Visit: Payer: Self-pay | Admitting: Internal Medicine

## 2022-04-21 ENCOUNTER — Other Ambulatory Visit: Payer: Self-pay

## 2022-04-21 ENCOUNTER — Ambulatory Visit (AMBULATORY_SURGERY_CENTER): Payer: 59

## 2022-04-21 VITALS — Ht 64.0 in | Wt 263.0 lb

## 2022-04-21 DIAGNOSIS — Z1211 Encounter for screening for malignant neoplasm of colon: Secondary | ICD-10-CM

## 2022-04-21 MED ORDER — NA SULFATE-K SULFATE-MG SULF 17.5-3.13-1.6 GM/177ML PO SOLN: 1.0000 | Freq: Once | ORAL | 0 refills | Status: AC

## 2022-04-21 NOTE — Progress Notes (Signed)
Denies allergies to eggs or soy products. Denies complication of anesthesia or sedation. Denies use of weight loss medication. Denies use of O2.   Emmi instructions given for colonoscopy.  

## 2022-04-28 ENCOUNTER — Other Ambulatory Visit: Payer: Self-pay | Admitting: Internal Medicine

## 2022-04-28 DIAGNOSIS — Z1231 Encounter for screening mammogram for malignant neoplasm of breast: Secondary | ICD-10-CM

## 2022-05-02 ENCOUNTER — Encounter: Payer: Self-pay | Admitting: Internal Medicine

## 2022-05-02 NOTE — Patient Instructions (Addendum)
Referral placed to [facility] Endocrinology for diabetes treatment. Currently appt scheduled for Sept 2024.Will see if [facility] can get her some assistance. Take Zithromax z-pak as directed and Hycodan sparingly for cough.

## 2022-05-05 NOTE — Patient Instructions (Signed)
We need urgent Endocrinology evaluation for this patient. She needs diabetic education and teaching. She has depression since her father passed away. She used to go to [facility] but quit going.She would like a Dexcom meter but needs assistance getting it approved.

## 2022-05-10 ENCOUNTER — Telehealth: Payer: Self-pay

## 2022-05-10 NOTE — Progress Notes (Signed)
   Care Guide Note  05/10/2022 Name: Crystal Wallace MRN: [id number] DOB: 03/13/1976  Referred by: Elby Showers, MD Reason for referral : Care Coordination (Outreach to schedule with Pharm D )   AFTIN LYE is a 47 y.o. year old female who is a primary care patient of Baxley, Cresenciano Lick, MD. [name] Gave was referred to the pharmacist for assistance related to DM.    An unsuccessful telephone outreach was attempted today to contact the patient who was referred to the pharmacy team for assistance with medication management. Additional attempts will be made to contact the patient.   Noreene Larsson, [place], [place] [address] Direct Dial: [phone] [email or web address]

## 2022-05-18 NOTE — Progress Notes (Signed)
   Care Guide Note  05/18/2022 Name: Crystal Wallace MRN: [id number] DOB: 1975/10/05  Referred by: Elby Showers, MD Reason for referral : Care Coordination (Outreach to schedule with Pharm D )   Crystal Wallace is a 47 y.o. year old female who is a primary care patient of Baxley, Cresenciano Lick, MD. Crystal Wallace was referred to the pharmacist for assistance related to DM.    Successful contact was made with the patient to discuss pharmacy services including being ready for the pharmacist to call at least 5 minutes before the scheduled appointment time, to have medication bottles and any blood sugar or blood pressure readings ready for review. The patient agreed to meet with the pharmacist via with the pharmacist via telephone visit on (date/time).  05/25/2022  Noreene Larsson, [place], [place] [address] Direct Dial: [phone] [email or web address]

## 2022-05-25 ENCOUNTER — Encounter: Payer: Self-pay | Admitting: Gastroenterology

## 2022-05-25 ENCOUNTER — Other Ambulatory Visit: Payer: 59

## 2022-05-25 NOTE — Patient Instructions (Signed)
Ms. Geng,  It was a pleasure to speak with you today.  Just to summarize our conversation: -A prior authorization has been initiated to get the Dexcom device and sensors covered on your insurance -I reached out to Dr. Renold Genta for refills on carvedilol and olmesartan -I also have requested that she send an order in for Ozempic and activated a copay coupon to assist with cost for it.  If the pharmacy does not apply that for some reason, the information they would need is:  [place], [place] [address], Group [id number], and [place] [id number] -I have also recommended a follow up with Dr. Verlene Mayer office in approximately 4 weeks  I will follow up on the prior authorization status for the Dexcom, as well as order status for the other medications and will keep you in the loop.  If you need anything else from me in the mean time, I can be reached at [phone].  Have a great evening!  Paulina Fusi, PharmD, DPLA

## 2022-05-25 NOTE — Progress Notes (Signed)
05/25/2022 Name: [name] MRN: [id number] DOB: [birth year]  Chief Complaint  Patient presents with   Medication Assistance    Dexcom    Crystal Wallace is a 47 y.o. year old female who presented for a telephone visit.   They were referred to the pharmacist by their PCP for assistance in managing medication access.   Patient is participating in a Managed Medicaid Plan:  No  Subjective: Patient referred to pharmacy to assist with obtaining Dexcom CGM devices. Care Team: Primary Care Provider: Renold Genta Cresenciano Lick, MD Medication Access/Adherence  Current Pharmacy:  [id number] [place] [id number]: [phone]: [phone]  Patient reports affordability concerns with their medications: Yes  Patient reports access/transportation concerns to their pharmacy: No  Patient reports adherence concerns with their medications:  Yes  Reports not taking any medications for months at a time after the passing of her father, but she could not tell me the exact period/amount of time.  Has been taking again regularly since the end of 2023.  Objective:  Lab Results  Component Value Date   HGBA1C 11.7 (H) 02/06/2022    Lab Results  Component Value Date   CREATININE 0.58 02/06/2022   BUN 11 02/06/2022   NA 138 02/06/2022   K 4.0 02/06/2022   CL 97 (L) 02/06/2022   CO2 25 02/06/2022    Lab Results  Component Value Date   CHOL 177 02/06/2022   HDL 45 (L) 02/06/2022   LDLCALC 92 02/06/2022   TRIG 279 (H) 02/06/2022   CHOLHDL 3.9 02/06/2022    Medications Reviewed Today     Reviewed by GDarlina Guys RSun[facility](Pharmacist) on 05/25/22 at [phone] Med List Status: <None>   Medication Order Taking? Sig Documenting Provider Last Dose Status Informant  Accu-Chek Softclix Lancets lancets [id number] Use as instructed Baxley, MCresenciano Lick MD  Active            Med Note (Colin Rhein  CNixonA   Thu May 25, 2022  8:45 AM) Patient states not using bc got tired of poking finger    Discontinued 05/25/22 0845 (Completed Course) amLODipine (NORVASC) 5 MG tablet [id number] TAKE 1 TABLET (5 MG TOTAL) BY MOUTH DAILY. BElby Showers MD  Active   blood glucose meter kit and supplies KIT [id number] Dispense based on patient and insurance preference. Use up to four times daily as directed. (FOR ICD-9 250.00, 250.01). BElby Showers MD  Active   buPROPion ([facility]) 150 MG 12 hr tablet [id number] TAKE 1 TABLET BY MOUTH TWICE A DAY Baxley, MCresenciano Lick MD  Active   carvedilol (COREG) 12.5 MG tablet [id number] Take 1 tablet (12.5 mg total) by mouth 2 (two) times daily with a meal. BElby Showers MD  Active     Discontinued 05/25/22 0844 (Completed Course)   furosemide (LASIX) 40 MG tablet [id number] TAKE 1 TABLET(40 MG) BY MOUTH DAILY Baxley, MCresenciano Lick MD  Active   glucose blood (ACCU-CHEK GUIDE) test strip [id number] Use as instructed BElby Showers MD  Active            Med Note (Colin Rhein [facility] Center[facility]   Thu May 25, 2022  8:46 AM) Patient states not using bc got tired of poking finger    Discontinued 027/51/7000174(Duplicate)   HYDROcodone bit-homatropine (HYCODAN) 5-1.5 MG/5ML syrup [id number]  Take 5 mLs by mouth every 8 (eight) hours as needed for cough.  Patient not taking: Reported on 04/21/2022   Elby Showers, MD  Active   levonorgestrel [facility]) 20 MCG/24HR IUD [id number]  1 each by Intrauterine route once. [provider]  Active   Multiple Vitamin (MULTIVITAMIN WITH MINERALS) TABS tablet [id number] Yes Take 1 tablet by mouth daily. [provider]  Active   olmesartan (BENICAR) 40 MG tablet [id number] Yes TAKE 1 TABLET BY MOUTH EVERY DAY Baxley, Cresenciano Lick, MD  Active   saxagliptin HCl (ONGLYZA) 5 MG TABS tablet [id number] Yes Take 1 tablet (5 mg total) by mouth daily. Elby Showers, MD  Active   simvastatin (ZOCOR) 40 MG tablet [id number] Yes Take 1 tablet (40 mg  total) by mouth at bedtime. Elby Showers, MD  Active             Assessment/Plan:  DM/Dexcom assistance Referral to endocrinology in but appt could not be scheduled until 01/10/23 Patient currently not checking blood glucose AT ALL due to "being tired of pricking fingers multiple times a day." Spoke with patient's insurance, and they will cover Dexcom with a prior authorization.  This has been initiated in Cover My Meds, but provider will need to answer questions and submit to plan:  Key for sensors is [id number]; Key for device is [id number] Orders pending for both to be sent to patient's preferred pharmacy Patient stated interest in GLP1 weekly injection, which I believe she would be a great candidate for.  No history of pancreatitis of personal/family history of medullary thyroid cancer.  Insurance covers [facility] without a PA, and I was able to get a coupon card to assist with copay.  Order pending to be sent to preferred pharmacy Continue Onglyza, but may want to consider alternative in the future:  Metformin (stated made stomach feel weird, but could initiate lower XR dose and titrate up) or SGLT2 2. HTN [employer] A.  Patient in need of refills on olmesartan '40mg'$  and carvedilol 12.'5mg'$ ; orders pending to be sent to preferred pharmacy. B.  Recommend follow-up with PCP to be scheduled in approximately 4 weeks to evaluate BP and medication regiment C.  If not at goal (<130/80), could increase carvedilol to '25mg'$  BID   Follow Up Plan: Will check on status of Dexcom PA early next week and schedule a follow-up appointment with patient after she is seen again by Dr. Renold Genta.  Thank you for involving me in the care of this patient.  Please do not hesitate to reach out to me with any further needs!

## 2022-05-26 ENCOUNTER — Encounter: Payer: Self-pay | Admitting: Gastroenterology

## 2022-05-26 ENCOUNTER — Ambulatory Visit (AMBULATORY_SURGERY_CENTER): Payer: 59 | Admitting: Gastroenterology

## 2022-05-26 VITALS — BP 169/89 | HR 83 | Temp 99.6°F | Resp 16 | Ht 64.0 in | Wt 269.0 lb

## 2022-05-26 DIAGNOSIS — Z1211 Encounter for screening for malignant neoplasm of colon: Secondary | ICD-10-CM | POA: Diagnosis present

## 2022-05-26 DIAGNOSIS — D122 Benign neoplasm of ascending colon: Secondary | ICD-10-CM

## 2022-05-26 DIAGNOSIS — K621 Rectal polyp: Secondary | ICD-10-CM | POA: Diagnosis not present

## 2022-05-26 MED ORDER — SODIUM CHLORIDE 0.9 % IV SOLN: 500.0000 mL | Freq: Once | INTRAVENOUS | Status: DC

## 2022-05-26 NOTE — Progress Notes (Signed)
Called to room to assist during endoscopic procedure.  Patient ID and intended procedure confirmed with present staff. Received instructions for my participation in the procedure from the performing physician.  

## 2022-05-26 NOTE — Patient Instructions (Signed)
Handout on hemorrhoids and polyps given to patient. Await pathology results. Resume previous diet and continue present medications. Repeat colonoscopy in 3-5 years based off of pathology results for surveillance!  YOU HAD AN ENDOSCOPIC PROCEDURE TODAY AT [facility]:   Refer to the procedure report that was given to you for any specific questions about what was found during the examination.  If the procedure report does not answer your questions, please call your gastroenterologist to clarify.  If you requested that your care partner not be given the details of your procedure findings, then the procedure report has been included in a sealed envelope for you to review at your convenience later.  YOU SHOULD EXPECT: Some feelings of bloating in the abdomen. Passage of more gas than usual.  Walking can help get rid of the air that was put into your GI tract during the procedure and reduce the bloating. If you had a lower endoscopy (such as a colonoscopy or flexible sigmoidoscopy) you may notice spotting of blood in your stool or on the toilet paper. If you underwent a bowel prep for your procedure, you may not have a normal bowel movement for a few days.  Please Note:  You might notice some irritation and congestion in your nose or some drainage.  This is from the oxygen used during your procedure.  There is no need for concern and it should clear up in a day or so.  SYMPTOMS TO REPORT IMMEDIATELY:  Following lower endoscopy (colonoscopy or flexible sigmoidoscopy):  Excessive amounts of blood in the stool  Significant tenderness or worsening of abdominal pains  Swelling of the abdomen that is new, acute  Fever of 100F or higher  For urgent or emergent issues, a gastroenterologist can be reached at any hour by calling [phone]. Do not use MyChart messaging for urgent concerns.    DIET:  We do recommend a small meal at first, but then you may proceed to your regular diet.   Drink plenty of fluids but you should avoid alcoholic beverages for 24 hours.  ACTIVITY:  You should plan to take it easy for the rest of today and you should NOT DRIVE or use heavy machinery until tomorrow (because of the sedation medicines used during the test).    FOLLOW UP: Our staff will call the number listed on your records the next business day following your procedure.  We will call around 7:15- 8:00 am to check on you and address any questions or concerns that you may have regarding the information given to you following your procedure. If we do not reach you, we will leave a message.     If any biopsies were taken you will be contacted by phone or by letter within the next 1-3 weeks.  Please call us at [phone] if you have not heard about the biopsies in 3 weeks.    SIGNATURES/CONFIDENTIALITY: You and/or your care partner have signed paperwork which will be entered into your electronic medical record.  These signatures attest to the fact that that the information above on your After Visit Summary has been reviewed and is understood.  Full responsibility of the confidentiality of this discharge information lies with you and/or your care-partner.

## 2022-05-26 NOTE — Progress Notes (Signed)
Pt's states no medical or surgical changes since previsit or office visit. VS assessed by D.T

## 2022-05-26 NOTE — Progress Notes (Signed)
[facility] History and Physical   Primary Care Physician:  Elby Showers, MD   Reason for Procedure:  Colorectal cancer screening  Plan:    Screening colonoscopy with possible interventions as needed     HPI: Crystal Wallace is a very pleasant 47 y.o. female here for screening colonoscopy. Denies any nausea, vomiting, abdominal pain, melena or bright red blood per rectum  The risks and benefits as well as alternatives of endoscopic procedure(s) have been discussed and reviewed. All questions answered. The patient agrees to proceed.    Past Medical History:  Diagnosis Date   Allergy    Depression    Diabetes ([place])    HTN (hypertension)    Hx of blood clots    Hyperlipidemia    Lactose intolerance    Vitamin D deficiency     Past Surgical History:  Procedure Laterality Date    c section  1999   adenoid removal age 79     cervical 61 and cervical 6 neck surgery  12-07-2010   ROBOT ASSISTED MYOMECTOMY  10/20/2011   Procedure: ROBOTIC ASSISTED MYOMECTOMY;  Surgeon: Lahoma Crocker, MD;  Location: [facility];  Service: Gynecology;  Laterality: N/A;    Prior to Admission medications   Medication Sig Start Date End Date Taking? Authorizing Provider  amLODipine (NORVASC) 5 MG tablet TAKE 1 TABLET (5 MG TOTAL) BY MOUTH DAILY. 03/23/22  Yes Elby Showers, MD  buPROPion (WELLBUTRIN SR) 150 MG 12 hr tablet TAKE 1 TABLET BY MOUTH TWICE A DAY 04/05/22  Yes Baxley, Cresenciano Lick, MD  carvedilol (COREG) 12.5 MG tablet Take 1 tablet (12.5 mg total) by mouth 2 (two) times daily with a meal. 12/26/21  Yes Baxley, Cresenciano Lick, MD  furosemide (LASIX) 40 MG tablet TAKE 1 TABLET(40 MG) BY MOUTH DAILY 12/26/21  Yes Baxley, Cresenciano Lick, MD  levonorgestrel (MIRENA) 20 MCG/24HR IUD 1 each by Intrauterine route once.   Yes [provider]  olmesartan (BENICAR) 40 MG tablet TAKE 1 TABLET BY MOUTH EVERY DAY 04/16/22  Yes Baxley, Cresenciano Lick, MD  saxagliptin HCl (ONGLYZA) 5 MG TABS tablet Take 1 tablet (5  mg total) by mouth daily. 03/24/22  Yes Baxley, Cresenciano Lick, MD  simvastatin (ZOCOR) 40 MG tablet Take 1 tablet (40 mg total) by mouth at bedtime. 12/26/21  Yes BaxleyCresenciano Lick, MD  Accu-Chek Softclix Lancets lancets Use as instructed Patient not taking: Reported on 05/26/2022 03/03/21   Elby Showers, MD  blood glucose meter kit and supplies KIT Dispense based on patient and insurance preference. Use up to four times daily as directed. (FOR ICD-9 250.00, 250.01). Patient not taking: Reported on 05/26/2022 03/01/21   Elby Showers, MD  glucose blood (ACCU-CHEK GUIDE) test strip Use as instructed Patient not taking: Reported on 05/26/2022 03/03/21   Elby Showers, MD  HYDROcodone bit-homatropine (HYCODAN) 5-1.5 MG/5ML syrup Take 5 mLs by mouth every 8 (eight) hours as needed for cough. Patient not taking: Reported on 04/21/2022 04/07/22   Elby Showers, MD  Multiple Vitamin (MULTIVITAMIN WITH MINERALS) TABS tablet Take 1 tablet by mouth daily. Patient not taking: Reported on 05/26/2022    [provider]    Current Outpatient Medications  Medication Sig Dispense Refill   amLODipine (NORVASC) 5 MG tablet TAKE 1 TABLET (5 MG TOTAL) BY MOUTH DAILY. 90 tablet 0   buPROPion (WELLBUTRIN SR) 150 MG 12 hr tablet TAKE 1 TABLET BY MOUTH TWICE A DAY 180 tablet 1  carvedilol (COREG) 12.5 MG tablet Take 1 tablet (12.5 mg total) by mouth 2 (two) times daily with a meal. 60 tablet 3   furosemide (LASIX) 40 MG tablet TAKE 1 TABLET(40 MG) BY MOUTH DAILY 90 tablet 3   levonorgestrel (MIRENA) 20 MCG/24HR IUD 1 each by Intrauterine route once.     olmesartan (BENICAR) 40 MG tablet TAKE 1 TABLET BY MOUTH EVERY DAY 90 tablet 1   saxagliptin HCl (ONGLYZA) 5 MG TABS tablet Take 1 tablet (5 mg total) by mouth daily. 30 tablet 5   simvastatin (ZOCOR) 40 MG tablet Take 1 tablet (40 mg total) by mouth at bedtime. 90 tablet 3   Accu-Chek Softclix Lancets lancets Use as instructed (Patient not taking: Reported on  05/26/2022) 100 each 12   blood glucose meter kit and supplies KIT Dispense based on patient and insurance preference. Use up to four times daily as directed. (FOR ICD-9 250.00, 250.01). (Patient not taking: Reported on 05/26/2022) 1 each 0   glucose blood (ACCU-CHEK GUIDE) test strip Use as instructed (Patient not taking: Reported on 05/26/2022) 100 each 12   HYDROcodone bit-homatropine (HYCODAN) 5-1.5 MG/5ML syrup Take 5 mLs by mouth every 8 (eight) hours as needed for cough. (Patient not taking: Reported on 04/21/2022) 120 mL 0   Multiple Vitamin (MULTIVITAMIN WITH MINERALS) TABS tablet Take 1 tablet by mouth daily. (Patient not taking: Reported on 05/26/2022)     Current Facility-Administered Medications  Medication Dose Route Frequency Provider Last Rate Last Admin   0.9 %  sodium chloride infusion  500 mL Intravenous Once Mauri Pole, MD        Allergies as of 05/26/2022   (No Known Allergies)    Family History  Problem Relation Age of Onset   Arthritis Mother    Heart disease Mother    Hyperlipidemia Mother    Hypertension Father    Diabetes Father    Obesity Father    Colon cancer Neg Hx    Esophageal cancer Neg Hx    Rectal cancer Neg Hx    Stomach cancer Neg Hx     Social History   Socioeconomic History   Marital status: Single    Spouse name: Not on file   Number of children: Not on file   Years of education: Not on file   Highest education level: Not on file  Occupational History   Occupation: Diplomatic Services operational officer  Tobacco Use   Smoking status: Never   Smokeless tobacco: Never  Vaping Use   Vaping Use: Never used  Substance and Sexual Activity   Alcohol use: No   Drug use: No   Sexual activity: Not on file  Other Topics Concern   Not on file  Social History Narrative   Not on file   Social Determinants of Health   Financial Resource Strain: Not on file  Food Insecurity: Not on file  Transportation Needs: Not on file  Physical Activity: Not on  file  Stress: Not on file  Social Connections: Not on file  Intimate Partner Violence: Not on file    Review of Systems:  All other review of systems negative except as mentioned in the HPI.  Physical Exam: Vital signs in last 24 hours: Blood Pressure (Abnormal) 175/68   Pulse 80   Temperature 99.6 F (37.6 C) (Skin)   Height '5\' 4"'$  (1.626 m)   Weight 269 lb (122 kg)   Oxygen Saturation 96%   Body Mass Index 46.17 kg/m  General:  Alert, NAD Lungs:  Clear .   Heart:  Regular rate and rhythm Abdomen:  Soft, nontender and nondistended. Neuro/Psych:  Alert and cooperative. Normal mood and affect. A and O x 3  Reviewed labs, radiology imaging, old records and pertinent past GI work up  Patient is appropriate for planned procedure(s) and anesthesia in an ambulatory setting   K. Denzil Magnuson , MD [phone]

## 2022-05-26 NOTE — Op Note (Signed)
[facility] Patient Name: Crystal Wallace Procedure Date: 05/26/2022 10:57 AM MRN: [id number] Endoscopist: Mauri Pole , MD, [id number] Age: 47 Referring MD:  Date of Birth: April 22, 1976 Gender: Female Account #: [id number] Procedure:                Colonoscopy Indications:              Screening for colorectal malignant neoplasm Medicines:                Monitored Anesthesia Care Procedure:                Pre-Anesthesia Assessment:                           - Prior to the procedure, a History and Physical                            was performed, and patient medications and                            allergies were reviewed. The patient's tolerance of                            previous anesthesia was also reviewed. The risks                            and benefits of the procedure and the sedation                            options and risks were discussed with the patient.                            All questions were answered, and informed consent                            was obtained. Prior Anticoagulants: The patient has                            taken no anticoagulant or antiplatelet agents. ASA                            Grade Assessment: III - A patient with severe                            systemic disease. After reviewing the risks and                            benefits, the patient was deemed in satisfactory                            condition to undergo the procedure.                           After obtaining informed consent, the colonoscope  was passed under direct vision. Throughout the                            procedure, the patient's blood pressure, pulse, and                            oxygen saturations were monitored continuously. The                            PCF-HQ190L Colonoscope was introduced through the                            anus and advanced to the the cecum, identified by                             appendiceal orifice and ileocecal valve. The                            colonoscopy was performed without difficulty. The                            patient tolerated the procedure well. The quality                            of the bowel preparation was good. The ileocecal                            valve, appendiceal orifice, and rectum were                            photographed. Scope In: 11:09:38 AM Scope Out: 11:22:11 AM Scope Withdrawal Time: 0 hours 9 minutes 34 seconds  Total Procedure Duration: 0 hours 12 minutes 33 seconds  Findings:                 The perianal and digital rectal examinations were                            normal.                           Two sessile polyps were found in the ascending                            colon. The polyps were 4 to 11 mm in size. These                            polyps were removed with a cold snare. Resection                            and retrieval were complete.                           Two sessile polyps were found in the rectum. The  polyps were 1 to 2 mm in size. These polyps were                            removed with a cold biopsy forceps. Resection and                            retrieval were complete.                           Non-bleeding external and internal hemorrhoids were                            found during retroflexion. The hemorrhoids were                            medium-sized. Complications:            No immediate complications. Estimated Blood Loss:     Estimated blood loss was minimal. Impression:               - Two 4 to 11 mm polyps in the ascending colon,                            removed with a cold snare. Resected and retrieved.                           - Two 1 to 2 mm polyps in the rectum, removed with                            a cold biopsy forceps. Resected and retrieved.                           - Non-bleeding external and internal hemorrhoids. Recommendation:            - Patient has a contact number available for                            emergencies. The signs and symptoms of potential                            delayed complications were discussed with the                            patient. Return to normal activities tomorrow.                            Written discharge instructions were provided to the                            patient.                           - Resume previous diet.                           -  Continue present medications.                           - Await pathology results.                           - Repeat colonoscopy in 3 - 5 years for                            surveillance based on pathology results. Mauri Pole, MD 05/26/2022 11:27:43 AM This report has been signed electronically.

## 2022-05-29 ENCOUNTER — Telehealth: Payer: Self-pay | Admitting: *Deleted

## 2022-05-29 NOTE — Telephone Encounter (Signed)
  Follow up Call-    Row Labels 05/26/2022   10:44 AM  Call back number   Section Header. No data exists in this row.   Post procedure Call Back phone  #   [phone]  Permission to leave phone message   Yes     Patient questions:  Do you have a fever, pain , or abdominal swelling? No. Pain Score  0 *  Have you tolerated food without any problems? Yes.    Have you been able to return to your normal activities? Yes.    Do you have any questions about your discharge instructions: Diet   No. Medications  No. Follow up visit  No.  Do you have questions or concerns about your Care? No.  Actions: * If pain score is 4 or above: No action needed, pain <4.

## 2022-05-30 ENCOUNTER — Telehealth: Payer: Self-pay | Admitting: Internal Medicine

## 2022-05-30 MED ORDER — OLMESARTAN MEDOXOMIL 40 MG PO TABS: 40.0000 mg | ORAL_TABLET | Freq: Every day | ORAL | 1 refills | Status: DC

## 2022-05-30 MED ORDER — CARVEDILOL 12.5 MG PO TABS: 12.5000 mg | ORAL_TABLET | Freq: Two times a day (BID) | ORAL | 3 refills | Status: DC

## 2022-05-30 NOTE — Telephone Encounter (Signed)
Received Fax RX request from  Pharmacy -  [id number] [place], [place]: [phone] Fax: [phone]     Medication - olmesartan (BENICAR) 40 MG tablet  Last Refill - 04/16/22  carvedilol (COREG) 12.5 MG tablet  Last Refill - 12/26/21  Last OV - 04/07/2022  Last CPE - 07/13/2017  Next Appointment - 06/23/2021

## 2022-05-30 NOTE — Telephone Encounter (Signed)
Rx sent to the pharmacy.

## 2022-05-31 ENCOUNTER — Telehealth: Payer: Self-pay

## 2022-05-31 NOTE — Progress Notes (Signed)
Reached out to inform patient that Dexcom PA was denied- insurance requires insulin therapy for approval.  I did discuss consideration of adding on long acting insulin and attempting PA again.  Also, informed her that [facility] prescriptions can be filled without insurance for $75/month.  Patient stated she will think about options and discuss with Dr. Renold Genta at upcoming appointment in February.

## 2022-06-14 ENCOUNTER — Encounter: Payer: Self-pay | Admitting: Gastroenterology

## 2022-06-16 ENCOUNTER — Other Ambulatory Visit: Payer: 59

## 2022-06-16 DIAGNOSIS — E782 Mixed hyperlipidemia: Secondary | ICD-10-CM

## 2022-06-16 DIAGNOSIS — E119 Type 2 diabetes mellitus without complications: Secondary | ICD-10-CM

## 2022-06-17 LAB — HEMOGLOBIN A1C
Hgb A1c MFr Bld: 11.3 % of total Hgb — ABNORMAL HIGH (ref <5.7)
Mean Plasma Glucose: 278 mg/dL
eAG (mmol/L): 15.4 mmol/L

## 2022-06-17 LAB — HEPATIC FUNCTION PANEL
AG Ratio: 1.6 (calc) (ref 1.0–2.5)
ALT: 23 U/L (ref 6–29)
AST: 21 U/L (ref 10–35)
Albumin: 4.1 g/dL (ref 3.6–5.1)
Alkaline phosphatase (APISO): 66 U/L (ref 31–125)
Bilirubin, Direct: 0.2 mg/dL (ref 0.0–0.2)
Globulin: 2.5 g/dL (calc) (ref 1.9–3.7)
Indirect Bilirubin: 0.8 mg/dL (calc) (ref 0.2–1.2)
Total Bilirubin: 1 mg/dL (ref 0.2–1.2)
Total Protein: 6.6 g/dL (ref 6.1–8.1)

## 2022-06-17 LAB — LIPID PANEL
Cholesterol: 158 mg/dL (ref <200)
HDL: 37 mg/dL — ABNORMAL LOW (ref >=50)
LDL Cholesterol (Calc): 86 mg/dL (calc)
Non-HDL Cholesterol (Calc): 121 mg/dL (calc) (ref <130)
Total CHOL/HDL Ratio: 4.3 (calc) (ref <5.0)
Triglycerides: 268 mg/dL — ABNORMAL HIGH (ref <150)

## 2022-06-17 LAB — MICROALBUMIN / CREATININE URINE RATIO
Creatinine, Urine: 23 mg/dL (ref 20–275)
Microalb Creat Ratio: 26 mcg/mg creat (ref <30)
Microalb, Ur: 0.6 mg/dL

## 2022-06-18 ENCOUNTER — Other Ambulatory Visit: Payer: Self-pay | Admitting: Internal Medicine

## 2022-06-20 NOTE — Progress Notes (Signed)
Patient Care Team: Elby Showers, MD as PCP - General (Internal Medicine) Buford Dresser, MD as PCP - Cardiology (Cardiology) Buford Dresser, MD as Consulting Physician (Cardiology)  Visit Date: 06/23/22  Subjective:    Patient ID: Crystal Wallace , Female   DOB: November 26, 1975, 47 y.o.    MRN: [id number]   47 y.o. Female presents today for blood pressure and A1C check. Patient has a past medical history of hypertension, diabetes, depression, hyperlipidemia, Vitamin D deficiency, blood clots.  History of Type 2 diabetes mellitus previously treated with Onglyza 5 mg daily. HGBA1C at 11.3 on 2/924. She is interested in trying at-home insulin injections.  History of hyperlipidemia treated with Zocor 40 mg daily at bedtime. HDL low at 37, TRIG elevated at 268 on 06/16/22, lipid panel otherwise normal.  History of hypertension treated with Norvasc 5 mg daily, Coreg 12.5 twice daily with meals, Benicar 40 mg daily. Blood pressure at 160/86 today, down from 169/89 on 05/26/22.  History of depression treated with Wellbutrin SR 150 mg twice daily. Reports mood is stable.  Reports intermittent left ankle pain that is worse when standing and walking. Reports sprained ankle 14 years ago. She has an ankle brace that she can wear to work.  Creatinine, microalbumin normal on 06/16/22.   Past Medical History:  Diagnosis Date   Allergy    Depression    Diabetes ([place])    HTN (hypertension)    Hx of blood clots    Hyperlipidemia    Lactose intolerance    Vitamin D deficiency      Family History  Problem Relation Age of Onset   Arthritis Mother    Heart disease Mother    Hyperlipidemia Mother    Hypertension Father    Diabetes Father    Obesity Father    Colon cancer Neg Hx    Esophageal cancer Neg Hx    Rectal cancer Neg Hx    Stomach cancer Neg Hx    Breast cancer Neg Hx     Social History   Social History Narrative   Not on file      Review of Systems   Constitutional:  Negative for fever and malaise/fatigue.  HENT:  Negative for congestion.   Eyes:  Negative for blurred vision.  Respiratory:  Negative for cough and shortness of breath.   Cardiovascular:  Negative for chest pain, palpitations and leg swelling.  Gastrointestinal:  Negative for vomiting.  Musculoskeletal:  Positive for joint pain (Intermittent left ankle). Negative for back pain.  Skin:  Negative for rash.  Neurological:  Negative for loss of consciousness and headaches.        Objective:   Vitals: BP (!) 160/86   Pulse 72   Temp 97.9 F (36.6 C) (Tympanic)   Ht 5' 4"$  (1.626 m)   Wt 273 lb 12.8 oz (124.2 kg)   SpO2 99%   BMI 47.00 kg/m    Physical Exam Vitals and nursing note reviewed.  Constitutional:      General: She is not in acute distress.    Appearance: Normal appearance. She is not toxic-appearing.  HENT:     Head: Normocephalic and atraumatic.  Cardiovascular:     Pulses:          Dorsalis pedis pulses are 2+ on the right side and 2+ on the left side.       Posterior tibial pulses are 0 on the right side and 0 on the left side.  Pulmonary:  Effort: Pulmonary effort is normal.  Feet:     Comments: Feet warm, no lesions bilaterally. Sensation intact bilaterally. Skin:    General: Skin is warm and dry.  Neurological:     Mental Status: She is alert and oriented to person, place, and time. Mental status is at baseline.  Psychiatric:        Mood and Affect: Mood normal.        Behavior: Behavior normal.        Thought Content: Thought content normal.        Judgment: Judgment normal.       Results:   Studies obtained and personally reviewed by me:   Labs:       Component Value Date/Time   NA 138 02/06/2022 0915   NA 139 12/06/2017 1036   K 4.0 02/06/2022 0915   CL 97 (L) 02/06/2022 0915   CO2 25 02/06/2022 0915   GLUCOSE 332 (H) 02/06/2022 0915   BUN 11 02/06/2022 0915   BUN 11 12/06/2017 1036   CREATININE 0.58  02/06/2022 0915   CALCIUM 9.1 02/06/2022 0915   PROT 6.6 06/16/2022 0919   PROT 6.8 12/06/2017 1036   ALBUMIN 4.4 12/06/2017 1036   AST 21 06/16/2022 0919   ALT 23 06/16/2022 0919   ALKPHOS 83 12/06/2017 1036   BILITOT 1.0 06/16/2022 0919   BILITOT 0.7 12/06/2017 1036   GFRNONAA 116 12/06/2017 1036   GFRNONAA 110 07/10/2017 0908   GFRAA 134 12/06/2017 1036   GFRAA 128 07/10/2017 0908     Lab Results  Component Value Date   WBC 11.2 (H) 12/06/2017   HGB 13.1 12/06/2017   HCT 38.9 12/06/2017   MCV 89 12/06/2017   PLT 392 07/10/2017    Lab Results  Component Value Date   CHOL 158 06/16/2022   HDL 37 (L) 06/16/2022   LDLCALC 86 06/16/2022   TRIG 268 (H) 06/16/2022   CHOLHDL 4.3 06/16/2022    Lab Results  Component Value Date   HGBA1C 11.3 (H) 06/16/2022     Lab Results  Component Value Date   TSH 1.480 12/06/2017      Assessment & Plan:   Type 2 diabetes mellitus: Previously treated with Onglyza 5 mg daily. HGBA1C at 11.3 on 2/924. Prescribed Humulin N 20 units QAU. Advised to use Accu-Chek strips before breakfast and dinner and call on Monday with glucose readings.  Hyperlipidemia: treated with Zocor 40 mg daily at bedtime. HDL low at 37, TRIG elevated at 268 on 06/16/22, lipid panel otherwise normal.  Hypertension: treated with Norvasc 5 mg daily, Coreg 12.5 twice daily with meals, Benicar 40 mg daily. Blood pressure at 160/86 today, down from 169/89 on 05/26/22.  Depression: treated with Wellbutrin SR 150 mg twice daily. Reports mood is stable.  Vaccine Counseling: Administered tetanus vaccine. Will return for pneumococcal 20 vaccine. UTD on flu vaccine.  Ordered CMB with GFR, CBG.   I,Alexander Ruley,acting as a Education administrator for Elby Showers, MD.,have documented all relevant documentation on the behalf of Elby Showers, MD,as directed by  Elby Showers, MD while in the presence of Elby Showers, MD.   I, Elby Showers, MD, have reviewed all documentation for this  visit. The documentation on 06/23/22 for the exam, diagnosis, procedures, and orders are all accurate and complete.

## 2022-06-23 ENCOUNTER — Ambulatory Visit
Admission: RE | Admit: 2022-06-23 | Discharge: 2022-06-23 | Disposition: A | Discharge status: Home / Self Care | Payer: 59 | Source: Ambulatory Visit | Attending: Internal Medicine | Admitting: Internal Medicine

## 2022-06-23 ENCOUNTER — Other Ambulatory Visit: Payer: Self-pay

## 2022-06-23 ENCOUNTER — Other Ambulatory Visit: Payer: Self-pay | Admitting: Internal Medicine

## 2022-06-23 ENCOUNTER — Telehealth: Payer: Self-pay

## 2022-06-23 ENCOUNTER — Ambulatory Visit: Payer: 59 | Admitting: Internal Medicine

## 2022-06-23 ENCOUNTER — Telehealth: Payer: Self-pay | Admitting: Internal Medicine

## 2022-06-23 VITALS — BP 160/86 | HR 72 | Temp 97.9°F | Ht 64.0 in | Wt 273.8 lb

## 2022-06-23 DIAGNOSIS — I10 Essential (primary) hypertension: Secondary | ICD-10-CM | POA: Diagnosis not present

## 2022-06-23 DIAGNOSIS — Z23 Encounter for immunization: Secondary | ICD-10-CM

## 2022-06-23 DIAGNOSIS — Z794 Long term (current) use of insulin: Secondary | ICD-10-CM

## 2022-06-23 DIAGNOSIS — E119 Type 2 diabetes mellitus without complications: Secondary | ICD-10-CM | POA: Diagnosis not present

## 2022-06-23 DIAGNOSIS — Z1231 Encounter for screening mammogram for malignant neoplasm of breast: Secondary | ICD-10-CM

## 2022-06-23 LAB — GLUCOSE, POCT (MANUAL RESULT ENTRY): POC Glucose: 428 mg/dl — AB (ref 70–99)

## 2022-06-23 MED ORDER — ACCU-CHEK GUIDE VI STRP: ORAL_STRIP | 12 refills | Status: DC

## 2022-06-23 MED ORDER — ACCU-CHEK SOFTCLIX LANCETS MISC: 12 refills | Status: DC

## 2022-06-23 NOTE — Telephone Encounter (Signed)
Patient left the office without getting Cmet lab drawn, patient was called and asked to call back to schedule lab appt

## 2022-06-23 NOTE — Telephone Encounter (Signed)
Call Saja on Monday to find out what her sugar readings were over the weekend.

## 2022-06-23 NOTE — Patient Instructions (Signed)
Begin Humulin N 20 u SQ q am. Take accuchecks twice daily. Eat regularly. Call Monday with accucheck readings

## 2022-06-26 ENCOUNTER — Encounter: Payer: Self-pay | Admitting: Internal Medicine

## 2022-06-27 ENCOUNTER — Other Ambulatory Visit: Payer: Self-pay

## 2022-06-27 MED ORDER — NOVOLIN N FLEXPEN 100 UNIT/ML ~~LOC~~ SUPN: 20.0000 [IU] | PEN_INJECTOR | Freq: Two times a day (BID) | SUBCUTANEOUS | 11 refills | Status: DC

## 2022-06-27 NOTE — Telephone Encounter (Signed)
Left message for patient to return call to office at [phone].

## 2022-06-27 NOTE — Telephone Encounter (Signed)
Called patient back and left a detailed message informing her after picking up Rx from pharmacy make sure she logs all glucose readings and call the office to inform Dr. Renold Genta of her reading.

## 2022-06-27 NOTE — Telephone Encounter (Signed)
LVM to CB and give me blood sugar readings

## 2022-06-27 NOTE — Telephone Encounter (Signed)
Crystal Wallace just called back and said the pharmacy said the she called the pharmacy and they said the insurance does not cover the insulin we sent in for her.

## 2022-06-27 NOTE — Telephone Encounter (Signed)
Anesa called back and said that the pharmacy said they had faxed [name] something and were waiting on [name] to respond. I let her know we had not received anything from them, and any time she received a text like that she should call them to see what it means and let us know.

## 2022-06-28 ENCOUNTER — Telehealth: Payer: Self-pay

## 2022-06-28 MED ORDER — BD PEN NEEDLE NANO 2ND GEN 32G X 4 MM MISC: 2 refills | Status: DC

## 2022-06-28 NOTE — Addendum Note (Signed)
Addended by: Geradine Girt D on: 06/28/2022 10:11 AM   Modules accepted: Orders

## 2022-06-28 NOTE — Telephone Encounter (Signed)
Patient called back stating she picked up insulin from the pharmacy and realized when she got home she did not have the needles to attached to the insulin flexpen. I called pharmacy to inquire about which pen needle is needed.  Pen needle Rx was ordered.  Patient will pick up this evening when she gets off work and will call the office with reading.

## 2022-06-28 NOTE — Progress Notes (Signed)
   06/28/2022  Patient ID: Crystal Wallace, female   DOB: 28-Feb-1976, 47 y.o.   MRN: [id number]  Left message for patient to call to schedule follow-up telephone visit around recent medication changes related to DM.  Working with PCP office to follow until she can be seen by Endocrinology in September.  Darlina Guys, PharmD, DPLA

## 2022-06-29 NOTE — Telephone Encounter (Signed)
Returned patient's called and informed her to get reading 3 x daily breakfast, lunch, dinner.  Patient verbalized understanding and agreed to call back tomorrow.

## 2022-06-29 NOTE — Telephone Encounter (Signed)
Called patient back informed her of message and asked patient to call back if any question

## 2022-06-29 NOTE — Telephone Encounter (Signed)
Crystal Wallace called to say her glucose reading was   11:15 -- 301 2:20 -- 292  I did let her know to call back tomorrow.  How many times does she need to be taking readings and when? She said to call back and leave a message.

## 2022-06-29 NOTE — Telephone Encounter (Signed)
Patient was called and asked if she took 20 units of insulin this morning and she replied yes.

## 2022-06-30 ENCOUNTER — Telehealth: Payer: Self-pay | Admitting: Internal Medicine

## 2022-06-30 ENCOUNTER — Encounter: Payer: Self-pay | Admitting: Internal Medicine

## 2022-06-30 MED ORDER — NOVOLIN N FLEXPEN 100 UNIT/ML ~~LOC~~ SUPN: 20.0000 [IU] | PEN_INJECTOR | Freq: Two times a day (BID) | SUBCUTANEOUS | 11 refills | Status: DC

## 2022-06-30 NOTE — Telephone Encounter (Signed)
Left detailed message for patient to call the office back with glucose readings, so if any adjustment need to be made before the weekend. Pharmacist has also reach out to patient and waiting on a return call

## 2022-06-30 NOTE — Telephone Encounter (Signed)
I reached out to Crystal Wallace today regarding accucheck readings.  She says last night it 20 p.m. Accu-Chek was 221   This morning Accu-Chek was 223 and most recently was 176.  She is planning to leave work soon and go home and eat dinner.  Says she is in the bed by 9 PM.  She has been taking insulin twice daily in the mornings before going to work and at bedtime which is around 9 PM.  She agrees to keep monitoring Accu-Cheks and let me know how things are going on Wednesday, February 28.

## 2022-06-30 NOTE — Addendum Note (Signed)
Addended by: Geradine Girt D on: 06/30/2022 02:27 PM   Modules accepted: Orders

## 2022-07-03 ENCOUNTER — Other Ambulatory Visit: Payer: Self-pay | Admitting: Internal Medicine

## 2022-08-16 ENCOUNTER — Telehealth: Payer: Self-pay

## 2022-08-16 NOTE — Telephone Encounter (Signed)
Called patient and she stated she has not received Dexcom and that her readings have been good

## 2022-09-11 ENCOUNTER — Other Ambulatory Visit: Payer: Self-pay | Admitting: Internal Medicine

## 2022-09-18 ENCOUNTER — Other Ambulatory Visit: Payer: Self-pay | Admitting: Internal Medicine

## 2022-09-22 NOTE — Telephone Encounter (Signed)
Left patient a voicemail asking for her glucose reading and to see how she is doing?

## 2022-12-10 ENCOUNTER — Other Ambulatory Visit: Payer: Self-pay | Admitting: Internal Medicine

## 2022-12-11 ENCOUNTER — Telehealth: Payer: Self-pay | Admitting: Internal Medicine

## 2022-12-11 NOTE — Telephone Encounter (Signed)
Called and left a VM that she needs to CB and schedule an appointment so we can refill her medications.

## 2022-12-11 NOTE — Telephone Encounter (Signed)
LVM to CB and schedule an appointment so we can refill medications

## 2022-12-25 ENCOUNTER — Ambulatory Visit: Payer: 59 | Admitting: Advanced Practice Midwife

## 2022-12-25 ENCOUNTER — Encounter: Payer: Self-pay | Admitting: Advanced Practice Midwife

## 2022-12-25 ENCOUNTER — Other Ambulatory Visit (HOSPITAL_COMMUNITY)
Admission: RE | Admit: 2022-12-25 | Discharge: 2022-12-25 | Disposition: A | Discharge status: Home / Self Care | Payer: 59 | Source: Ambulatory Visit | Attending: Advanced Practice Midwife | Admitting: Advanced Practice Midwife

## 2022-12-25 VITALS — BP 191/103 | HR 82 | Ht 63.5 in | Wt 269.0 lb

## 2022-12-25 DIAGNOSIS — Z01419 Encounter for gynecological examination (general) (routine) without abnormal findings: Secondary | ICD-10-CM

## 2022-12-25 DIAGNOSIS — Z30433 Encounter for removal and reinsertion of intrauterine contraceptive device: Secondary | ICD-10-CM

## 2022-12-25 DIAGNOSIS — Z975 Presence of (intrauterine) contraceptive device: Secondary | ICD-10-CM

## 2022-12-25 MED ORDER — LEVONORGESTREL 20 MCG/DAY IU IUD
1.0000 | INTRAUTERINE_SYSTEM | Freq: Once | INTRAUTERINE | Status: AC
  Administered 2022-12-25: 1 via INTRAUTERINE

## 2022-12-25 NOTE — Progress Notes (Signed)
Pt presents for AEX.  Last PAP 07/2017 Last mammogram 06/2022 Colonoscopy this year Declines STD testing Pt states IUD expired last months. Requesting removal and reinsertion.

## 2022-12-25 NOTE — Progress Notes (Signed)
Subjective:     Crystal Wallace is a 47 y.o. female here at [facility] for a routine exam.  Current complaints: Periods heavier in last 2-3 months, was amenorrheic with Mirena but now having regular heavy menses again.  Personal health history reviewed: yes.  Do you have a primary care provider? yes Do you feel safe at home? yes  Flowsheet Row Office Visit from 12/25/2022 in [facility] Total Score 1       Health Maintenance Due  Topic Date Due   COVID-19 Vaccine (4 - 2023-24 season) 01/06/2022   INFLUENZA VACCINE  12/07/2022   HEMOGLOBIN A1C  12/15/2022     Risk factors for chronic health problems: Smoking: Alchohol/how much: Pt BMI: Body mass index is 46.9 kg/m.   Gynecologic History No LMP recorded. (Menstrual status: IUD). Contraception: IUD Last Pap: 07/13/17. Results were: normal Last mammogram: 06/23/22. Results were: normal  Obstetric History OB History  Gravida Para Term Preterm AB Living  1         1  SAB IAB Ectopic Multiple Live Births               # Outcome Date GA Lbr Len/2nd Weight Sex Type Anes PTL Lv  1 Gravida              The following portions of the patient's history were reviewed and updated as appropriate: allergies, current medications, past family history, past medical history, past social history, past surgical history, and problem list.  Review of Systems Pertinent items noted in HPI and remainder of comprehensive ROS otherwise negative.    Objective:  BP (!) 191/103   Pulse 82   Ht 5' 3.5" (1.613 m)   Wt 269 lb (122 kg)   BMI 46.90 kg/m   VS reviewed, nursing note reviewed,  Constitutional: well developed, well nourished, no distress HEENT: normocephalic, thyroid without enlargement or mass HEART: RRR, no murmurs rubs/gallops RESP: clear and equal to auscultation bilaterally in all lobes  Breast Exam:   exam performed: right breast normal without mass, skin or nipple changes or  axillary nodes, left breast normal without mass, skin or nipple changes or axillary nodes Abdomen: soft Neuro: alert and oriented x 3 Skin: warm, dry Psych: affect normal Pelvic exam: Performed: Cervix pink, visually closed, without lesion, scant white creamy discharge, vaginal walls and external genitalia normal Bimanual exam: Cervix 0/long/high, firm, anterior, neg CMT, uterus nontender, nonenlarged, adnexa without tenderness, enlargement, or mass       IUD Removal  Patient was in the dorsal lithotomy position, normal external genitalia was noted.  A speculum was placed in the patient's vagina, normal discharge was noted, no lesions. The multiparous cervix was visualized, no lesions, no abnormal discharge.  The strings of the IUD were grasped and pulled using ring forceps. The IUD was removed in its entirety. Patient tolerated the procedure well.      IUD Procedure Note Patient identified, informed consent performed.  Discussed risks of irregular bleeding, cramping, infection, malpositioning or misplacement of the IUD outside the uterus which may require further procedures. Time out was performed.  Urine pregnancy test negative.  Speculum placed in the vagina.  Cervix visualized.  Cleaned with Betadine x 2.  Grasped anteriorly with a single tooth tenaculum.  Uterus sounded to 7 cm.  Mirena IUD placed per manufacturer's recommendations.  Strings trimmed to 3 cm. Tenaculum was removed, good hemostasis noted.  Patient tolerated procedure  well.   Patient was given post-procedure instructions and the Mirena care card with expiration date.  Patient was also asked to check IUD strings periodically and follow up in 4-6 weeks for IUD check.     Assessment/Plan:   1. Encounter for annual routine gynecological examination --Up to date on mammogram, colonoscopy --Declines STI screening  - Cytology - PAP( [place])  2. Encounter for IUD removal and reinsertion --IUD removed and new Mirena IUD  placed without difficulty.  See procedure note above.   Return for string check.   Sharen Counter, CNM 3:32 AM

## 2022-12-26 LAB — CYTOLOGY - PAP
Comment: NEGATIVE
Diagnosis: NEGATIVE
High risk HPV: NEGATIVE

## 2022-12-31 DIAGNOSIS — Z975 Presence of (intrauterine) contraceptive device: Secondary | ICD-10-CM | POA: Insufficient documentation

## 2023-01-11 ENCOUNTER — Encounter: Payer: Self-pay | Admitting: Internal Medicine

## 2023-01-11 ENCOUNTER — Ambulatory Visit: Payer: 59 | Admitting: Internal Medicine

## 2023-01-11 VITALS — BP 138/88 | HR 70 | Ht 63.5 in | Wt 271.0 lb

## 2023-01-11 DIAGNOSIS — E1165 Type 2 diabetes mellitus with hyperglycemia: Secondary | ICD-10-CM | POA: Diagnosis not present

## 2023-01-11 DIAGNOSIS — R6889 Other general symptoms and signs: Secondary | ICD-10-CM | POA: Diagnosis not present

## 2023-01-11 DIAGNOSIS — Z794 Long term (current) use of insulin: Secondary | ICD-10-CM | POA: Diagnosis not present

## 2023-01-11 LAB — CORTISOL: Cortisol, Plasma: 8.6 ug/dL

## 2023-01-11 LAB — BASIC METABOLIC PANEL
BUN: 14 mg/dL (ref 6–23)
CO2: 27 meq/L (ref 19–32)
Calcium: 9.5 mg/dL (ref 8.4–10.5)
Chloride: 101 meq/L (ref 96–112)
Creatinine, Ser: 0.65 mg/dL (ref 0.40–1.20)
GFR: 105.47 mL/min (ref >60.00)
Glucose, Bld: 214 mg/dL — ABNORMAL HIGH (ref 70–99)
Potassium: 4.1 meq/L (ref 3.5–5.1)
Sodium: 137 meq/L (ref 135–145)

## 2023-01-11 LAB — POCT GLYCOSYLATED HEMOGLOBIN (HGB A1C): Hemoglobin A1C: 10.2 % — AB (ref 4.0–5.6)

## 2023-01-11 LAB — POCT GLUCOSE (DEVICE FOR HOME USE): Glucose Fasting, POC: 231 mg/dL — AB (ref 70–99)

## 2023-01-11 MED ORDER — INSULIN PEN NEEDLE 31G X 5 MM MISC: 1.0000 | Freq: Two times a day (BID) | 3 refills | Status: DC

## 2023-01-11 MED ORDER — NOVOLIN N FLEXPEN 100 UNIT/ML ~~LOC~~ SUPN: 22.0000 [IU] | PEN_INJECTOR | Freq: Two times a day (BID) | SUBCUTANEOUS | 3 refills | Status: DC

## 2023-01-11 MED ORDER — TIRZEPATIDE 2.5 MG/0.5ML ~~LOC~~ SOAJ: 2.5000 mg | SUBCUTANEOUS | 3 refills | Status: DC

## 2023-01-11 MED ORDER — DEXCOM G7 SENSOR MISC: 1.0000 | 3 refills | Status: DC

## 2023-01-11 NOTE — Progress Notes (Signed)
Name: Crystal Wallace  MRN/ DOB: [id number], 06-Jul-1975   Age/ Sex: 47 y.o., female    PCP: Margaree Mackintosh, MD   Reason for Endocrinology Evaluation: Type 2 Diabetes Mellitus     Date of Initial Endocrinology Visit: 01/11/2023     PATIENT IDENTIFIER: Crystal Wallace is a 47 y.o. female with a past medical history of HTN, DM and dyslipidemia. The patient presented for initial endocrinology clinic visit on 01/11/2023 for consultative assistance with her diabetes management.    HPI: Crystal Wallace was    Diagnosed with DM 2014 Prior Medications tried/Intolerance: Metformin- stomach pain/diarrhea. Insulin started 06/2022  Currently checking blood sugars 0 x / day Hemoglobin A1c has ranged from 5.9% in 2016, peaking at 11.7% in 2023.   In terms of diet, the patient eats 3 meals a day, snacks occasionally, drinks rare regular ginger ale.    Patient on Mirena  In reviewing her chart she had a normal renin, aldosterone, and Aldo/renin ratio 11/2017  Denies nausea or vomiting  Denies constipation or diarrhea   Works 12 hr shifts. Goes to bed before 10 pm   NO recent steroids    HOME DIABETES REGIMEN: NPH 20 units BID    Statin: yes ACE-I/ARB: yes  METER DOWNLOAD SUMMARY: n/a  DIABETIC COMPLICATIONS: Microvascular complications:   Denies: CKD, neuropathy, retinopathy Last eye exam: Completed 01/2022  Macrovascular complications:   Denies: CAD, PVD, CVA   PAST HISTORY: Past Medical History:  Past Medical History:  Diagnosis Date   Allergy    Depression    Diabetes (HCC)    HTN (hypertension)    Hx of blood clots    Hyperlipidemia    Lactose intolerance    Vitamin D deficiency    Past Surgical History:  Past Surgical History:  Procedure Laterality Date    c section  1999   adenoid removal age 30     cervical 5 and cervical 6 neck surgery  12-07-2010   ROBOT ASSISTED MYOMECTOMY  10/20/2011   Procedure: ROBOTIC ASSISTED MYOMECTOMY;  Surgeon: Antionette Char, MD;   Location: [facility];  Service: Gynecology;  Laterality: N/A;    Social History:  reports that she has never smoked. She has never used smokeless tobacco. She reports that she does not drink alcohol and does not use drugs. Family History:  Family History  Problem Relation Age of Onset   Arthritis Mother    Heart disease Mother    Hyperlipidemia Mother    Hypertension Father    Diabetes Father    Obesity Father    Colon cancer Neg Hx    Esophageal cancer Neg Hx    Rectal cancer Neg Hx    Stomach cancer Neg Hx    Breast cancer Neg Hx      HOME MEDICATIONS: Allergies as of 01/11/2023   No Known Allergies      Medication List        Accurate as of January 11, 2023 10:56 AM. If you have any questions, ask your nurse or doctor.          Accu-Chek Guide test strip Generic drug: glucose blood Use as instructed   Accu-Chek Softclix Lancets lancets Use as instructed   amLODipine 5 MG tablet Commonly known as: NORVASC TAKE 1 TABLET (5 MG TOTAL) BY MOUTH DAILY.   BD Pen Needle Nano 2nd Gen 32G X 4 MM Misc Generic drug: Insulin Pen Needle Inject 20 Units into the skin in the morning and at  bedtime   blood glucose meter kit and supplies Kit Dispense based on patient and insurance preference. Use up to four times daily as directed. (FOR ICD-9 250.00, 250.01).   carvedilol 12.5 MG tablet Commonly known as: COREG TAKE 1 TABLET (12.5MG  TOTAL) BY MOUTH TWICE A DAY WITH MEALS   furosemide 40 MG tablet Commonly known as: LASIX TAKE 1 TABLET(40 MG) BY MOUTH DAILY   levonorgestrel 20 MCG/24HR IUD Commonly known as: MIRENA 1 each by Intrauterine route once.   multivitamin with minerals Tabs tablet Take 1 tablet by mouth daily.   NON FORMULARY Diabetic Health Pack from [facility] N FlexPen 100 UNIT/ML FlexPen Generic drug: Insulin NPH (Human) (Isophane) Inject 20 Units into the skin 2 (two) times daily.   olmesartan 40 MG tablet Commonly known as:  BENICAR TAKE 1 TABLET BY MOUTH EVERY DAY   simvastatin 40 MG tablet Commonly known as: ZOCOR Take 1 tablet (40 mg total) by mouth at bedtime.   tirzepatide 2.5 MG/0.5ML Pen Commonly known as: MOUNJARO Inject 2.5 mg into the skin once a week. Started by: Johnney Ou Jaivyn Gulla         ALLERGIES: No Known Allergies   REVIEW OF SYSTEMS: A comprehensive ROS was conducted with the patient and is negative except as per HPI     OBJECTIVE:   VITAL SIGNS: BP 138/88 (BP Location: Left Arm, Patient Position: Sitting, Cuff Size: Large)   Pulse 70   Ht 5' 3.5" (1.613 m)   Wt 271 lb (122.9 kg)   SpO2 95%   BMI 47.25 kg/m    PHYSICAL EXAM:  General: Pt appears well and is in NAD  Neck: General: Supple without adenopathy or carotid bruits. Thyroid: Thyroid size normal.  No goiter or nodules appreciated.   Lungs: Clear with good BS bilat   Heart: RRR   Abdomen:  soft, nontender Abdominal wall striae noted  Extremities:  Lower extremities - No pretibial edema.   Skin: Hirsutism over chin area  Neuro: MS is good with appropriate affect, pt is alert and Ox3    DM foot exam: 01/11/2023  The skin of the feet is intact without sores or ulcerations. The pedal pulses are 2+ on right and 2+ on left. The sensation is intact to a screening 5.07, 10 gram monofilament bilaterally    DATA REVIEWED:  Lab Results  Component Value Date   HGBA1C 10.2 (A) 01/11/2023   HGBA1C 11.3 (H) 06/16/2022   HGBA1C 11.7 (H) 02/06/2022    Latest Reference Range & Units 01/11/23 11:16  Sodium 135 - 145 mEq/L 137  Potassium 3.5 - 5.1 mEq/L 4.1  Chloride 96 - 112 mEq/L 101  CO2 19 - 32 mEq/L 27  Glucose 70 - 99 mg/dL 119 (H)  BUN 6 - 23 mg/dL 14  Creatinine 1.47 - 8.29 mg/dL 5.62  Calcium 8.4 - 13.0 mg/dL 9.5  GFR >86.57 mL/min 105.47    Latest Reference Range & Units 01/11/23 11:16  Cortisol, Plasma ug/dL 8.6       In office BG 231 mg/dL     ASSESSMENT / PLAN / RECOMMENDATIONS:   1)  Type 2 Diabetes Mellitus, Poorly controlled, Without complications - Most recent A1c of 12.2 %. Goal A1c < 7.0 %.    Plan: GENERAL: I have discussed with the patient the pathophysiology of diabetes. We went over the natural progression of the disease. We stressed the importance of lifestyle changes. I explained the complications associated with diabetes including retinopathy,  nephropathy, neuropathy as well as increased risk of cardiovascular disease. We went over the benefit seen with glycemic control.   I explained to the patient that diabetic patients are at higher than normal risk for amputations.  Intolerant to metformin We discussed opioid agonist, caution against GI side effects Will increase insulin as below Today's labs show normal BMP   MEDICATIONS: Start Mounjaro 2.5 mg weekly Increase Novolin- N 22 units twice daily  EDUCATION / INSTRUCTIONS: BG monitoring instructions: Patient is instructed to check her blood sugars 3 times a day. Call [place] [facility] if: BG persistently < 70  I reviewed the Rule of 15 for the treatment of hypoglycemia in detail with the patient. Literature supplied.   2) Diabetic complications:  Eye: Does not have known diabetic retinopathy.  Neuro/ Feet: Does not have known diabetic peripheral neuropathy. Renal: Patient does not have known baseline CKD. She is  on an ACEI/ARB at present.  3) Cushingoid features:  -Will proceed with screening for Cushing syndrome with saliva cortisol, emphasized the importance of swallowing mucosal surface at midnight, discussed the importance of timing -ACTH, pending today -cortisol is normal    Follow-up in 3 months   Signed electronically by: Lyndle Herrlich, MD  [facility]  [facility] [address] [place], [place] [address] Phone: [phone] FAX: [phone]   CC: Margaree Mackintosh, MD [address] [place] [address] Phone:  [phone]  Fax: [phone]    Return to Endocrinology clinic as below: No future appointments.

## 2023-01-11 NOTE — Patient Instructions (Addendum)
Start Mounjaro 2.5 mg once weekly Increase Novolin- N 22 units in the morning and 22 units at bedtime     SALIVARY CORTISOL COLLECTION INSTRUCTIONS    Precautions:  Please collect sample at Midnight . You will need to do this on 2 nights  No food or fluids 30 minutes prior to collection.  Do not use any creams, lotions on hands, or use steroid inhalers 24- hours prior to collection.  Wash hands carefully.  Avoid any activity that could cause your gums to bleed: including flushing of brushing your teeth.  Kit must not be used in children less than 6 years of age, or a person that is at risk for choking on collection kit.  Instructions for saliva collection:   Rinse mouth thoroughly with water and discard. Do not swallow.  Hold the Salivette at the rim of the suspended insert and remove the stopper.  Remove the swab.  Place swab under tongue until well saturated, approximately 1 minute.   Return the saturated swab to the suspended insert and close the Salivette firmly with the stopper.  Do not remove the tube holding the insert. The Salivette should be sent to the lab with the swab.   Come to the lab and leave the Salivette kit for labeling with your identifying information.   Make sure you refrigerate sample if not bringing to the lab immediately. Try to use cold packs for transportation if available.    HOW TO TREAT LOW BLOOD SUGARS (Blood sugar LESS THAN 70 MG/DL) Please follow the RULE OF 15 for the treatment of hypoglycemia treatment (when your (blood sugars are less than 70 mg/dL)   STEP 1: Take 15 grams of carbohydrates when your blood sugar is low, which includes:  3-4 GLUCOSE TABS  OR 3-4 OZ OF JUICE OR REGULAR SODA OR ONE TUBE OF GLUCOSE GEL    STEP 2: RECHECK blood sugar in 15 MINUTES STEP 3: If your blood sugar is still low at the 15 minute recheck --> then, go back to STEP 1 and treat AGAIN with another 15 grams of carbohydrates.

## 2023-01-12 ENCOUNTER — Telehealth: Payer: Self-pay | Admitting: Pharmacist

## 2023-01-12 NOTE — Progress Notes (Addendum)
   01/12/2023 Name: Crystal Wallace MRN: [id number] DOB: November 18, 1975  Patient left voicemail on pharmacist Cheryl's phone yesterday. Tried to call patient back and see how we may assist. LVM to return call to patient. Spoke with mother who does not know what patient was calling for- will advice she calls back when she gets the chance.  Of note, appears patient had visit with Endo yesterday and Rx sent for Novolin N 20U BID and Mounjaro 2.5mg  weekly. Can provide coupon card for $25 Mounjaro due to [employer]. May consider calling insurance company for better long-acting insulin and coverage under formulary for better adherence with once daily dosing vs twice daily.   Marlowe Aschoff, PharmD [facility] Phone Number: [phone]

## 2023-01-14 LAB — ACTH: C206 ACTH: 15 pg/mL (ref 6–50)

## 2023-01-16 ENCOUNTER — Telehealth: Payer: Self-pay

## 2023-01-16 ENCOUNTER — Other Ambulatory Visit: Payer: Self-pay | Admitting: Pharmacist

## 2023-01-16 MED ORDER — LANTUS SOLOSTAR 100 UNIT/ML ~~LOC~~ SOPN: 44.0000 [IU] | PEN_INJECTOR | Freq: Every day | SUBCUTANEOUS | 2 refills | Status: DC

## 2023-01-16 NOTE — Progress Notes (Unsigned)
   01/16/2023  Patient ID: Crystal Wallace, female   DOB: 1975-12-10, 47 y.o.   MRN: [id number]  Spoke with the patient on the phone. She reports having difficulty affording the new Mounjaro and insulin prescription- requesting help with cost concerns.  For [facility], contacted pharmacy. Will be $0 for 1 month supply of 0.25mg  dose. Aware to ask for coupon card to be added for each refill.   For insulin, it was $64.78 for a 3 month supply (102 days worth). Looks like this was billed for 1 month supply instead of 3 months at the pharmacy. May not be on formulary. Will investigate cost of long-acting insulin as well for better patient adherence- expressed interest in alternative.   On 01/16/23: Patient made aware she can pick-up and start Mounjaro while investigating cost concerns with insulin.   **LVM for Dr. Lonzo Cloud on 01/16/23 at 12:00PM requesting call back for approval to investigate cost concerns related to Novolin vs Lantus/Toujeo based on patient's formulary. Also need to ask about PA with [facility].  (Would do 36 units Lantus starting w/ max of 60 units daily for titration.)   Marlowe Aschoff, PharmD [facility] Phone Number: [phone]

## 2023-01-16 NOTE — Telephone Encounter (Signed)
Crystal Wallace pharmacist working with Dr. Lenord Fellers office is trying to see about changing the Novolin to Toujeo or Lantus as patient is unable to afford the $65 dollars a month for the Novolin.   CB [phone]

## 2023-01-17 ENCOUNTER — Other Ambulatory Visit (HOSPITAL_COMMUNITY): Payer: Self-pay

## 2023-01-17 ENCOUNTER — Telehealth: Payer: Self-pay

## 2023-01-17 NOTE — Telephone Encounter (Signed)
Pharmacy Patient Advocate Encounter   Received notification from Pt Calls Messages that prior authorization for Dexcom G7 sensor is required/requested.   Insurance verification completed.   The patient is insured through [facility] .   Per test claim: PA required; PA submitted to [facility] via CoverMyMeds Key/confirmation #/EOC [id number] Status is pending   Status is APPROVED through 01/17/2024  PA Case ID #: [id number]

## 2023-01-30 ENCOUNTER — Telehealth: Payer: Self-pay | Admitting: Pharmacist

## 2023-01-30 NOTE — Progress Notes (Signed)
01/30/2023  Patient ID: Crystal Wallace, female   DOB: 09/27/1975, 47 y.o.   MRN: [id number]  Received call stating the Dexcom was $75, but doesn't know if that was for a 1 or 3 month supply. Asked if there was a coupon card or something cheaper. Reports she cannot afford until her FLEX (similar to HSA dollars) kicks back in at January. Already received 1 sample from the office to trial and first time getting from the pharmacy.  Advised I will verify Dexcom is preferred by her insurance vs Libre. I will also checked if it's a 1 or 3 month supply at the pharmacy.  Aware if unable to afford, will have to wait until January to start the Dexcom.   *Updates:* - For a 3 month supply of sensors it is $75- advised she can break into $25 per 1 mo if she would prefer - There is no preference by her insurance for [employer] vs Dexcom sensors - Also no coupons available to make it cheaper   Marlowe Aschoff, PharmD [facility] Phone Number: [phone]

## 2023-02-08 ENCOUNTER — Encounter: Payer: Self-pay | Admitting: Advanced Practice Midwife

## 2023-02-08 ENCOUNTER — Telehealth: Payer: Self-pay

## 2023-02-08 NOTE — Telephone Encounter (Signed)
Called patient regarding mychart message stating that her cramps are about an 8/10. She states that her pain is not relieved by tylenol or ibuprofen. States that she takes tylenol 1000 mg about every 4 hours and ibuprofen 400 mg every 4 hours. She as not tried a heating pad heating. Patient advised to try increase ibuprofen to 600 mg every 6 hours or 800 mg every 8 hours along with using a heating pad. Advised to return call if symptoms are not being managed.

## 2023-03-02 ENCOUNTER — Ambulatory Visit: Payer: 59 | Admitting: Internal Medicine

## 2023-03-02 VITALS — BP 140/90 | HR 67 | Ht 63.5 in | Wt 273.0 lb

## 2023-03-02 DIAGNOSIS — E781 Pure hyperglyceridemia: Secondary | ICD-10-CM | POA: Diagnosis not present

## 2023-03-02 DIAGNOSIS — E119 Type 2 diabetes mellitus without complications: Secondary | ICD-10-CM

## 2023-03-02 DIAGNOSIS — Z794 Long term (current) use of insulin: Secondary | ICD-10-CM

## 2023-03-02 DIAGNOSIS — I10 Essential (primary) hypertension: Secondary | ICD-10-CM | POA: Diagnosis not present

## 2023-03-02 MED ORDER — CARVEDILOL 12.5 MG PO TABS: 12.5000 mg | ORAL_TABLET | Freq: Two times a day (BID) | ORAL | 1 refills | Status: DC

## 2023-03-02 MED ORDER — AMLODIPINE BESYLATE 5 MG PO TABS: 5.0000 mg | ORAL_TABLET | Freq: Every day | ORAL | 1 refills | Status: DC

## 2023-03-02 MED ORDER — OLMESARTAN MEDOXOMIL 40 MG PO TABS: 40.0000 mg | ORAL_TABLET | Freq: Every day | ORAL | 1 refills | Status: DC

## 2023-03-02 MED ORDER — FUROSEMIDE 40 MG PO TABS: ORAL_TABLET | ORAL | 3 refills | Status: DC

## 2023-03-02 MED ORDER — SIMVASTATIN 40 MG PO TABS: 40.0000 mg | ORAL_TABLET | Freq: Every day | ORAL | 3 refills | Status: DC

## 2023-03-02 NOTE — Progress Notes (Shared)
Patient Care Team: Margaree Mackintosh, MD as PCP - General (Internal Medicine) Jodelle Red, MD as PCP - Cardiology (Cardiology) Jodelle Red, MD as Consulting Physician (Cardiology)  Visit Date: 03/02/23  Subjective:    Patient ID: Crystal Wallace , Female   DOB: Apr 11, 1976, 47 y.o.    MRN: [id number]   47 y.o. Female presents today for medication refills. History of Type 2 diabetes mellitus treated with insulin glargine 44 units daily, Mounjaro 2.5 mg once weekly. She is eating regular meals. Her aunt passed away recently and she seems to be doing well with the impacts. She has been feeling better now that her blood sugar is stable. Dexcom blood glucose measurements are stable aside from one blood glucose measurement of 70 on one night that resolved quickly.  History of hypertension treated with carvedilol 12.5 mg twice daily, amlodipine 5 mg daily, furosemide 40 mg daily, olmesartan 40 mg daily. Requesting refills. Denies chest pain, shortness of breath. Blood pressure elevated in-office today at 140/90.  History of hyperlipidemia treated with simvastatin 40 mg daily. Requesting refill.  Reminded of eye exam.  Past Medical History:  Diagnosis Date   Allergy    Depression    Diabetes (HCC)    HTN (hypertension)    Hx of blood clots    Hyperlipidemia    Lactose intolerance    Vitamin D deficiency      Family History  Problem Relation Age of Onset   Arthritis Mother    Heart disease Mother    Hyperlipidemia Mother    Hypertension Father    Diabetes Father    Obesity Father    Colon cancer Neg Hx    Esophageal cancer Neg Hx    Rectal cancer Neg Hx    Stomach cancer Neg Hx    Breast cancer Neg Hx     Social History   Social History Narrative   Not on file      Review of Systems  Constitutional:  Negative for fever and malaise/fatigue.  HENT:  Negative for congestion.   Eyes:  Negative for blurred vision.  Respiratory:  Negative for cough and  shortness of breath.   Cardiovascular:  Negative for chest pain, palpitations and leg swelling.  Gastrointestinal:  Negative for vomiting.  Musculoskeletal:  Negative for back pain.  Skin:  Negative for rash.  Neurological:  Negative for loss of consciousness and headaches.        Objective:   Vitals: BP (!) 140/90   Pulse 67   Ht 5' 3.5" (1.613 m)   Wt 273 lb (123.8 kg)   SpO2 98%   BMI 47.60 kg/m    Physical Exam Vitals and nursing note reviewed.  Constitutional:      General: She is not in acute distress.    Appearance: Normal appearance. She is not toxic-appearing.  HENT:     Head: Normocephalic and atraumatic.  Cardiovascular:     Rate and Rhythm: Normal rate and regular rhythm. No extrasystoles are present.    Pulses: Normal pulses.     Heart sounds: Normal heart sounds. No murmur heard.    No friction rub. No gallop.  Pulmonary:     Effort: Pulmonary effort is normal. No respiratory distress.     Breath sounds: Normal breath sounds. No wheezing or rales.  Musculoskeletal:     Right lower leg: No edema.     Left lower leg: No edema.  Skin:    General: Skin is warm and  dry.  Neurological:     Mental Status: She is alert and oriented to person, place, and time. Mental status is at baseline.  Psychiatric:        Mood and Affect: Mood normal.        Behavior: Behavior normal.        Thought Content: Thought content normal.        Judgment: Judgment normal.      Results:   Studies obtained and personally reviewed by me:   Labs:       Component Value Date/Time   NA 137 01/11/2023 1116   NA 139 12/06/2017 1036   K 4.1 01/11/2023 1116   CL 101 01/11/2023 1116   CO2 27 01/11/2023 1116   GLUCOSE 214 (H) 01/11/2023 1116   BUN 14 01/11/2023 1116   BUN 11 12/06/2017 1036   CREATININE 0.65 01/11/2023 1116   CREATININE 0.58 02/06/2022 0915   CALCIUM 9.5 01/11/2023 1116   PROT 6.6 06/16/2022 0919   PROT 6.8 12/06/2017 1036   ALBUMIN 4.4 12/06/2017 1036    AST 21 06/16/2022 0919   ALT 23 06/16/2022 0919   ALKPHOS 83 12/06/2017 1036   BILITOT 1.0 06/16/2022 0919   BILITOT 0.7 12/06/2017 1036   GFRNONAA 116 12/06/2017 1036   GFRNONAA 110 07/10/2017 0908   GFRAA 134 12/06/2017 1036   GFRAA 128 07/10/2017 0908     Lab Results  Component Value Date   WBC 11.2 (H) 12/06/2017   HGB 13.1 12/06/2017   HCT 38.9 12/06/2017   MCV 89 12/06/2017   PLT 392 07/10/2017    Lab Results  Component Value Date   CHOL 158 06/16/2022   HDL 37 (L) 06/16/2022   LDLCALC 86 06/16/2022   TRIG 268 (H) 06/16/2022   CHOLHDL 4.3 06/16/2022    Lab Results  Component Value Date   HGBA1C 10.2 (A) 01/11/2023     Lab Results  Component Value Date   TSH 1.480 12/06/2017      Assessment & Plan:   Type 2 diabetes mellitus: treated with insulin glargine 44 units daily, Mounjaro 2.5 mg once weekly. Continue to monitor Dexcom readings.   Hypertension: treated with carvedilol 12.5 mg twice daily, amlodipine 5 mg daily, furosemide 40 mg daily, olmesartan 40 mg daily. Refilled these.   Hyperlipidemia: treated with simvastatin 40 mg daily. Refilled.  Vaccine counseling: reports she is UTD on flu vaccine and will get Covid-19 booster soon.  Ordered CBC with Diff/Plat, CMET, lipid panel, A1c. Will contact with results.    I,Alexander Ruley,acting as a Neurosurgeon for Margaree Mackintosh, MD.,have documented all relevant documentation on the behalf of Margaree Mackintosh, MD,as directed by  Margaree Mackintosh, MD while in the presence of Margaree Mackintosh, MD.   ***

## 2023-03-03 LAB — COMPLETE METABOLIC PANEL WITH GFR
AG Ratio: 1.8 (calc) (ref 1.0–2.5)
ALT: 12 U/L (ref 6–29)
AST: 10 U/L (ref 10–35)
Albumin: 4.1 g/dL (ref 3.6–5.1)
Alkaline phosphatase (APISO): 62 U/L (ref 31–125)
BUN: 10 mg/dL (ref 7–25)
CO2: 27 mmol/L (ref 20–32)
Calcium: 8.8 mg/dL (ref 8.6–10.2)
Chloride: 106 mmol/L (ref 98–110)
Creat: 0.51 mg/dL (ref 0.50–0.99)
Globulin: 2.3 g/dL (ref 1.9–3.7)
Glucose, Bld: 144 mg/dL — ABNORMAL HIGH (ref 65–99)
Potassium: 4.2 mmol/L (ref 3.5–5.3)
Sodium: 141 mmol/L (ref 135–146)
Total Bilirubin: 1 mg/dL (ref 0.2–1.2)
Total Protein: 6.4 g/dL (ref 6.1–8.1)
eGFR: 117 mL/min/{1.73_m2} (ref >=60)

## 2023-03-03 LAB — CBC WITH DIFFERENTIAL/PLATELET
Absolute Lymphocytes: 4081 {cells}/uL — ABNORMAL HIGH (ref 850–3900)
Absolute Monocytes: 627 {cells}/uL (ref 200–950)
Basophils Absolute: 66 {cells}/uL (ref 0–200)
Basophils Relative: 0.6 %
Eosinophils Absolute: 286 {cells}/uL (ref 15–500)
Eosinophils Relative: 2.6 %
HCT: 36.6 % (ref 35.0–45.0)
Hemoglobin: 12.2 g/dL (ref 11.7–15.5)
MCH: 30 pg (ref 27.0–33.0)
MCHC: 33.3 g/dL (ref 32.0–36.0)
MCV: 90.1 fL (ref 80.0–100.0)
MPV: 9.5 fL (ref 7.5–12.5)
Monocytes Relative: 5.7 %
Neutro Abs: 5940 {cells}/uL (ref 1500–7800)
Neutrophils Relative %: 54 %
Platelets: 366 10*3/uL (ref 140–400)
RBC: 4.06 10*6/uL (ref 3.80–5.10)
RDW: 12.2 % (ref 11.0–15.0)
Total Lymphocyte: 37.1 %
WBC: 11 10*3/uL — ABNORMAL HIGH (ref 3.8–10.8)

## 2023-03-03 LAB — LIPID PANEL
Cholesterol: 141 mg/dL (ref <200)
HDL: 38 mg/dL — ABNORMAL LOW (ref >=50)
LDL Cholesterol (Calc): 84 mg/dL
Non-HDL Cholesterol (Calc): 103 mg/dL (ref <130)
Total CHOL/HDL Ratio: 3.7 (calc) (ref <5.0)
Triglycerides: 94 mg/dL (ref <150)

## 2023-03-03 LAB — HEMOGLOBIN A1C
Hgb A1c MFr Bld: 8.5 %{Hb} — ABNORMAL HIGH (ref <5.7)
Mean Plasma Glucose: 197 mg/dL
eAG (mmol/L): 10.9 mmol/L

## 2023-03-08 ENCOUNTER — Encounter: Payer: Self-pay | Admitting: Internal Medicine

## 2023-03-08 NOTE — Patient Instructions (Signed)
It was a pleasure to see you today.  Continue treatment for diabetes as prescribed by endocrinology.  Hemoglobin A1c has improved to 8.5% down from 10.2% when checked in September.  Continue multidrug treatment for hypertension including carvedilol, amlodipine, furosemide and olmesartan.  Continue simvastatin for hyperlipidemia.  Please get COVID booster in the near future.

## 2023-04-12 ENCOUNTER — Encounter: Payer: Self-pay | Admitting: Internal Medicine

## 2023-04-12 ENCOUNTER — Ambulatory Visit: Payer: 59 | Admitting: Internal Medicine

## 2023-04-12 VITALS — BP 130/72 | HR 70 | Ht 63.5 in | Wt 269.8 lb

## 2023-04-12 DIAGNOSIS — E1165 Type 2 diabetes mellitus with hyperglycemia: Secondary | ICD-10-CM

## 2023-04-12 DIAGNOSIS — R6889 Other general symptoms and signs: Secondary | ICD-10-CM | POA: Diagnosis not present

## 2023-04-12 DIAGNOSIS — Z794 Long term (current) use of insulin: Secondary | ICD-10-CM

## 2023-04-12 LAB — POCT GLYCOSYLATED HEMOGLOBIN (HGB A1C): Hemoglobin A1C: 6.9 % — AB (ref 4.0–5.6)

## 2023-04-12 MED ORDER — LANTUS SOLOSTAR 100 UNIT/ML ~~LOC~~ SOPN: 40.0000 [IU] | PEN_INJECTOR | Freq: Every day | SUBCUTANEOUS | 2 refills | Status: DC

## 2023-04-12 MED ORDER — TIRZEPATIDE 5 MG/0.5ML ~~LOC~~ SOAJ: 5.0000 mg | SUBCUTANEOUS | 3 refills | Status: DC

## 2023-04-12 NOTE — Progress Notes (Signed)
Name: Crystal Wallace  MRN/ DOB: [id number], 09-29-1975   Age/ Sex: 47 y.o., female    PCP: Margaree Mackintosh, MD   Reason for Endocrinology Evaluation: Type 2 Diabetes Mellitus     Date of Initial Endocrinology Visit: 01/11/2023    PATIENT IDENTIFIER: Crystal Wallace is a 47 y.o. female with a past medical history of HTN, DM and dyslipidemia. The patient presented for initial endocrinology clinic visit on 01/11/2023 for consultative assistance with her diabetes management.    HPI: Ms. Sublett was    Diagnosed with DM 2014 Prior Medications tried/Intolerance: Metformin- stomach pain/diarrhea. Insulin started 06/2022  Hemoglobin A1c has ranged from 5.9% in 2016, peaking at 11.7% in 2023.   Patient on Mirena  In reviewing her chart she had a normal renin, aldosterone, and Aldo/renin ratio 11/2017   Works 12 hr shifts. Goes to bed before 10 pm   On her initial visit to our clinic she was on NPH 20 units twice daily with an A1c of 10.2% I increased her insulin and started her on Mounjaro   I also want to screen her for Cushing syndrome with saliva cortisol   SUBJECTIVE:   During the last visit (01/11/2023): A1c 10.2%  Today (04/12/23): Crystal Wallace is here for follow-up on diabetes management.  She checks her blood sugars multiple times daily. The patient has not had hypoglycemic episodes since the last clinic visit.   Patient has not returned saliva cortisol kit yet Weight stable Denies nausea or vomiting  No constipation or diarrhea    HOME DIABETES REGIMEN: Mounjaro 2.5 mg weekly Lantus 44 units daily      Statin: yes ACE-I/ARB: yes  CONTINUOUS GLUCOSE MONITORING RECORD INTERPRETATION    Dates of Recording: 11/22-12/09/2022  Sensor description:dexcom  Results statistics:   CGM use % of time 86  Average and SD 172/38  Time in range      67%  % Time Above 180 29  % Time above 250 4  % Time Below target 0   Glycemic patterns summary: BGs fluctuate throughout  the day and night  Hyperglycemic episodes postprandial  Hypoglycemic episodes occurred N/A  Overnight periods: Trends down to optimal      DIABETIC COMPLICATIONS: Microvascular complications:   Denies: CKD, neuropathy, retinopathy Last eye exam: Completed 01/2022  Macrovascular complications:   Denies: CAD, PVD, CVA   PAST HISTORY: Past Medical History:  Past Medical History:  Diagnosis Date   Allergy    Depression    Diabetes (HCC)    HTN (hypertension)    Hx of blood clots    Hyperlipidemia    Lactose intolerance    Vitamin D deficiency    Past Surgical History:  Past Surgical History:  Procedure Laterality Date    c section  1999   adenoid removal age 49     cervical 5 and cervical 6 neck surgery  12-07-2010   ROBOT ASSISTED MYOMECTOMY  10/20/2011   Procedure: ROBOTIC ASSISTED MYOMECTOMY;  Surgeon: Antionette Char, MD;  Location: [facility];  Service: Gynecology;  Laterality: N/A;    Social History:  reports that she has never smoked. She has never used smokeless tobacco. She reports that she does not drink alcohol and does not use drugs. Family History:  Family History  Problem Relation Age of Onset   Arthritis Mother    Heart disease Mother    Hyperlipidemia Mother    Hypertension Father    Diabetes Father    Obesity Father  Colon cancer Neg Hx    Esophageal cancer Neg Hx    Rectal cancer Neg Hx    Stomach cancer Neg Hx    Breast cancer Neg Hx      HOME MEDICATIONS: Allergies as of 04/12/2023   No Known Allergies      Medication List        Accurate as of April 12, 2023 11:48 AM. If you have any questions, ask your nurse or doctor.          STOP taking these medications    Accu-Chek Guide test strip Generic drug: glucose blood Stopped by: Johnney Ou Helmuth Recupero   Accu-Chek Softclix Lancets lancets Stopped by: Johnney Ou Mariacristina Aday   blood glucose meter kit and supplies Kit Stopped by: Johnney Ou Jakobie Henslee       TAKE these  medications    amLODipine 5 MG tablet Commonly known as: NORVASC Take 1 tablet (5 mg total) by mouth daily.   carvedilol 12.5 MG tablet Commonly known as: COREG Take 1 tablet (12.5 mg total) by mouth 2 (two) times daily with a meal.   Dexcom G7 Sensor Misc 1 Device by Does not apply route as directed.   furosemide 40 MG tablet Commonly known as: LASIX TAKE 1 TABLET(40 MG) BY MOUTH DAILY   Insulin Pen Needle 31G X 5 MM Misc 1 Device by Does not apply route in the morning and at bedtime.   Lantus SoloStar 100 UNIT/ML Solostar Pen Generic drug: insulin glargine Inject 44 Units into the skin daily.   levonorgestrel 20 MCG/24HR IUD Commonly known as: MIRENA 1 each by Intrauterine route once.   multivitamin with minerals Tabs tablet Take 1 tablet by mouth daily.   NON FORMULARY Diabetic Health Pack from [facility] Made   olmesartan 40 MG tablet Commonly known as: BENICAR Take 1 tablet (40 mg total) by mouth daily.   simvastatin 40 MG tablet Commonly known as: ZOCOR Take 1 tablet (40 mg total) by mouth at bedtime.   tirzepatide 2.5 MG/0.5ML Pen Commonly known as: MOUNJARO Inject 2.5 mg into the skin once a week.         ALLERGIES: No Known Allergies   REVIEW OF SYSTEMS: A comprehensive ROS was conducted with the patient and is negative except as per HPI     OBJECTIVE:   VITAL SIGNS: BP 130/72 (BP Location: Left Arm, Patient Position: Sitting, Cuff Size: Large)   Pulse 70   Ht 5' 3.5" (1.613 m)   Wt 269 lb 12.8 oz (122.4 kg)   SpO2 99%   BMI 47.04 kg/m    PHYSICAL EXAM:  General: Pt appears well and is in NAD  Neck: General: Supple without adenopathy or carotid bruits. Thyroid: Thyroid size normal.  No goiter or nodules appreciated.   Lungs: Clear with good BS bilat   Heart: RRR   Extremities:  Lower extremities - No pretibial edema.   Neuro: MS is good with appropriate affect, pt is alert and Ox3    DM foot exam: 01/11/2023  The skin of the feet is  intact without sores or ulcerations. The pedal pulses are 2+ on right and 2+ on left. The sensation is intact to a screening 5.07, 10 gram monofilament bilaterally    DATA REVIEWED:  Lab Results  Component Value Date   HGBA1C 8.5 (H) 03/02/2023   HGBA1C 10.2 (A) 01/11/2023   HGBA1C 11.3 (H) 06/16/2022    Latest Reference Range & Units 03/02/23 11:11  Sodium 135 - 146 mmol/L  141  Potassium 3.5 - 5.3 mmol/L 4.2  Chloride 98 - 110 mmol/L 106  CO2 20 - 32 mmol/L 27  Glucose 65 - 99 mg/dL 161 (H)  Mean Plasma Glucose mg/dL 096  BUN 7 - 25 mg/dL 10  Creatinine 0.45 - 4.09 mg/dL 8.11  Calcium 8.6 - 91.4 mg/dL 8.8  BUN/Creatinine Ratio 6 - 22 (calc) SEE NOTE:  eGFR > OR = 60 mL/min/1.59m2 117  AG Ratio 1.0 - 2.5 (calc) 1.8  AST 10 - 35 U/L 10  ALT 6 - 29 U/L 12  Total Protein 6.1 - 8.1 g/dL 6.4  Total Bilirubin 0.2 - 1.2 mg/dL 1.0  Total CHOL/HDL Ratio <5.0 (calc) 3.7  Cholesterol <200 mg/dL 782  HDL Cholesterol > OR = 50 mg/dL 38 (L)  LDL Cholesterol (Calc) mg/dL (calc) 84  Non-HDL Cholesterol (Calc) <130 mg/dL (calc) 956  Triglycerides <150 mg/dL 94    Old records , labs and images have been reviewed.    ASSESSMENT / PLAN / RECOMMENDATIONS:   1) Type 2 Diabetes Mellitus, Optimally controlled, Without complications - Most recent A1c of 6.9 %. Goal A1c < 7.0 %.     -A1c has trended down from 10.2% to 6.9% -Intolerant to metformin -She is tolerating Mounjaro, will increase -I will also decrease Lantus preemptively to prevent hypoglycemia -Her most recent labs were reviewed that were done at PCPs office  MEDICATIONS: Increase Mounjaro 5 mg weekly Decrease Lantus 40 units daily  EDUCATION / INSTRUCTIONS: BG monitoring instructions: Patient is instructed to check her blood sugars 3 times a day. Call [facility] if: BG persistently < 70  I reviewed the Rule of 15 for the treatment of hypoglycemia in detail with the patient. Literature supplied.   2)  Diabetic complications:  Eye: Does not have known diabetic retinopathy.  Neuro/ Feet: Does not have known diabetic peripheral neuropathy. Renal: Patient does not have known baseline CKD. She is  on an ACEI/ARB at present.  3) Cushingoid features:  -I had ordered saliva cortisol kit for her to screen for Cushing syndrome but she has not submitted this yet.  I have again encouraged the patient to submit saliva cortisol - Cortisol normal at  8.6 UG/DL, and ACTH 15 pg/mL 06/1306   Follow-up in 3 months   Signed electronically by: Lyndle Herrlich, MD  [facility]  [facility] [address] [place] [place], [place] [address] Phone: [phone] FAX: [phone]   CC: Margaree Mackintosh, MD [address] [place] [address] Phone: [phone]  Fax: [phone]    Return to Endocrinology clinic as below: Future Appointments  Date Time Provider Department Center  04/12/2023 11:50 AM Khye Hochstetler, Konrad Dolores, MD [id number] None

## 2023-04-12 NOTE — Patient Instructions (Addendum)
Start Mounjaro 5 mg once weekly Decrease Lantus 40 units daily     SALIVARY CORTISOL COLLECTION INSTRUCTIONS    Precautions:  Please collect sample at Midnight . You will need to do this on 2 nights  No food or fluids 30 minutes prior to collection.  Do not use any creams, lotions on hands, or use steroid inhalers 24- hours prior to collection.  Wash hands carefully.  Avoid any activity that could cause your gums to bleed: including flushing of brushing your teeth.  Kit must not be used in children less than 11 years of age, or a person that is at risk for choking on collection kit.  Instructions for saliva collection:   Rinse mouth thoroughly with water and discard. Do not swallow.  Hold the Salivette at the rim of the suspended insert and remove the stopper.  Remove the swab.  Place swab under tongue until well saturated, approximately 1 minute.   Return the saturated swab to the suspended insert and close the Salivette firmly with the stopper.  Do not remove the tube holding the insert. The Salivette should be sent to the lab with the swab.   Come to the lab and leave the Salivette kit for labeling with your identifying information.   Make sure you refrigerate sample if not bringing to the lab immediately. Try to use cold packs for transportation if available.    HOW TO TREAT LOW BLOOD SUGARS (Blood sugar LESS THAN 70 MG/DL) Please follow the RULE OF 15 for the treatment of hypoglycemia treatment (when your (blood sugars are less than 70 mg/dL)   STEP 1: Take 15 grams of carbohydrates when your blood sugar is low, which includes:  3-4 GLUCOSE TABS  OR 3-4 OZ OF JUICE OR REGULAR SODA OR ONE TUBE OF GLUCOSE GEL    STEP 2: RECHECK blood sugar in 15 MINUTES STEP 3: If your blood sugar is still low at the 15 minute recheck --> then, go back to STEP 1 and treat AGAIN with another 15 grams of carbohydrates.

## 2023-04-14 ENCOUNTER — Other Ambulatory Visit: Payer: Self-pay | Admitting: Internal Medicine

## 2023-05-08 ENCOUNTER — Other Ambulatory Visit: Payer: Self-pay

## 2023-05-08 MED ORDER — LANTUS SOLOSTAR 100 UNIT/ML ~~LOC~~ SOPN: PEN_INJECTOR | SUBCUTANEOUS | 3 refills | Status: DC

## 2023-05-09 ENCOUNTER — Encounter: Payer: Self-pay | Admitting: Internal Medicine

## 2023-05-14 ENCOUNTER — Ambulatory Visit: Payer: 59 | Admitting: Internal Medicine

## 2023-05-14 VITALS — BP 130/80 | HR 80 | Temp 100.2°F | Ht 63.5 in | Wt 269.0 lb

## 2023-05-14 DIAGNOSIS — E119 Type 2 diabetes mellitus without complications: Secondary | ICD-10-CM

## 2023-05-14 DIAGNOSIS — Z794 Long term (current) use of insulin: Secondary | ICD-10-CM

## 2023-05-14 DIAGNOSIS — J329 Chronic sinusitis, unspecified: Secondary | ICD-10-CM | POA: Diagnosis not present

## 2023-05-14 DIAGNOSIS — H6693 Otitis media, unspecified, bilateral: Secondary | ICD-10-CM

## 2023-05-14 DIAGNOSIS — I10 Essential (primary) hypertension: Secondary | ICD-10-CM | POA: Diagnosis not present

## 2023-05-14 LAB — POC COVID19 BINAXNOW: SARS Coronavirus 2 Ag: NEGATIVE

## 2023-05-14 MED ORDER — AZITHROMYCIN 250 MG PO TABS: ORAL_TABLET | ORAL | 0 refills | Status: AC

## 2023-05-14 MED ORDER — FLUCONAZOLE 150 MG PO TABS: 150.0000 mg | ORAL_TABLET | Freq: Once | ORAL | 0 refills | Status: AC

## 2023-05-14 NOTE — Progress Notes (Signed)
 Patient Care Team: Perri Ronal PARAS, MD as PCP - General (Internal Medicine) Lonni Slain, MD as PCP - Cardiology (Cardiology) Lonni Slain, MD as Consulting Physician (Cardiology)  Visit Date: 05/14/23  Subjective:   Chief Complaint  Patient presents with   Sinusitis    Face pain.    Patient PI:Crystal Wallace,Female DOB:1976/01/31,48 y.o.FMW:[id number]   48 y.o. Female presents today for acute sick visit with sinusitis. Patient has a past medical history of DM, type 2. She reports that she began to feel symptoms 12/27, which she endorses has been nasal congestion, greenish-yellow nasal drainage, and recently some epistaxis. UTD on Flu and Covid vaccine.   History of Type 2 Diabetes Mellitus treated with Insulin  Glargine 44 units daily and Mounjaro  2.5 mg once weekly. Dexcom blood glucose measurements have recently been under 200. HgbA1c 6.9 on 04/12/23, improved from 8.5 on 10/25.  Past Medical History:  Diagnosis Date   Allergy    Depression    Diabetes (HCC)    HTN (hypertension)    Hx of blood clots    Hyperlipidemia    Lactose intolerance    Vitamin D  deficiency     Family History  Problem Relation Age of Onset   Arthritis Mother    Heart disease Mother    Hyperlipidemia Mother    Hypertension Father    Diabetes Father    Obesity Father    Colon cancer Neg Hx    Esophageal cancer Neg Hx    Rectal cancer Neg Hx    Stomach cancer Neg Hx    Breast cancer Neg Hx    Social History   Social History Narrative   Not on file   Review of Systems  Constitutional:  Negative for fever and malaise/fatigue.  HENT:  Positive for congestion and nosebleeds.        (+) Nasal Drainage, greenish-yellow  Eyes:  Negative for blurred vision.  Respiratory:  Negative for cough and shortness of breath.   Cardiovascular:  Negative for chest pain, palpitations and leg swelling.  Gastrointestinal:  Negative for vomiting.  Musculoskeletal:  Negative for back pain.   Skin:  Negative for rash.  Neurological:  Negative for loss of consciousness and headaches.     Objective:  Vitals: BP 130/80   Pulse 80   Temp 100.2 F (37.9 C)   Ht 5' 3.5 (1.613 m)   Wt 269 lb (122 kg)   SpO2 95%   BMI 46.90 kg/m  Physical Exam Vitals and nursing note reviewed.  Constitutional:      General: She is not in acute distress.    Appearance: Normal appearance. She is not toxic-appearing.  HENT:     Head: Normocephalic and atraumatic.     Ears:     Comments: Right Ear: dull, splayed light reflex, slightly pink Left Ear (worse than right): dull, splayed light reflex, slightly pink    Mouth/Throat:     Comments: Throat: slightly injected Pulmonary:     Effort: Pulmonary effort is normal.  Skin:    General: Skin is warm and dry.  Neurological:     Mental Status: She is alert and oriented to person, place, and time. Mental status is at baseline.  Psychiatric:        Mood and Affect: Mood normal.        Behavior: Behavior normal.        Thought Content: Thought content normal.        Judgment: Judgment normal.  Results:  Studies obtained and personally reviewed by me: Labs:     Component Value Date/Time   NA 141 03/02/2023 1111   NA 139 12/06/2017 1036   K 4.2 03/02/2023 1111   CL 106 03/02/2023 1111   CO2 27 03/02/2023 1111   GLUCOSE 144 (H) 03/02/2023 1111   BUN 10 03/02/2023 1111   BUN 11 12/06/2017 1036   CREATININE 0.51 03/02/2023 1111   CALCIUM 8.8 03/02/2023 1111   PROT 6.4 03/02/2023 1111   PROT 6.8 12/06/2017 1036   ALBUMIN 4.4 12/06/2017 1036   AST 10 03/02/2023 1111   ALT 12 03/02/2023 1111   ALKPHOS 83 12/06/2017 1036   BILITOT 1.0 03/02/2023 1111   BILITOT 0.7 12/06/2017 1036   GFRNONAA 116 12/06/2017 1036   GFRNONAA 110 07/10/2017 0908   GFRAA 134 12/06/2017 1036   GFRAA 128 07/10/2017 0908    Lab Results  Component Value Date   WBC 11.0 (H) 03/02/2023   HGB 12.2 03/02/2023   HCT 36.6 03/02/2023   MCV 90.1 03/02/2023    PLT 366 03/02/2023   Lab Results  Component Value Date   CHOL 141 03/02/2023   HDL 38 (L) 03/02/2023   LDLCALC 84 03/02/2023   TRIG 94 03/02/2023   CHOLHDL 3.7 03/02/2023   Lab Results  Component Value Date   HGBA1C 6.9 (A) 04/12/2023    Lab Results  Component Value Date   TSH 1.480 12/06/2017    Assessment & Plan:   Acute Bilateral Non-Serous Otitis Media: UTD on Flu and Covid vaccine. Sending in 250 mg Azithromycin  - take 2 tablets on day 1, then 1 tablet daily on days 2 through 5, and 150 mg Diflucan  in case of yeast infection.  Likely has acute maxillary sinusitis as well  Type 2 Diabetes Mellitus: treated with Insulin  Glargine 44 units daily and Mounjaro  2.5 mg once weekly. Dexcom blood glucose measurements have recently been under 200. HgbA1c 6.9% on 04/12/23, improved from 8.5% on 10/25.Seen by Endocrinology.  I,Emily Lagle,acting as a neurosurgeon for Ronal JINNY Hailstone, MD.,have documented all relevant documentation on the behalf of Ronal JINNY Hailstone, MD,as directed by  Ronal JINNY Hailstone, MD while in the presence of Ronal JINNY Hailstone, MD.   I, Ronal JINNY Hailstone, MD, have reviewed all documentation for this visit. The documentation on 05/21/23 for the exam, diagnosis, procedures, and orders are all accurate and complete.

## 2023-05-21 NOTE — Patient Instructions (Addendum)
 We are pleased your diabetes is under much better control with Endocrinologist's help.  You have an acute bilateral otitis media and likely an acute maxillary sinusitis as well.  You have been prescribed Zithromax  Z-PAK 2 tabs day 1 followed by 1 tab days 2 through 5 which indicates generally works well for you.  Sending in Diflucan  150 mg tablet should you develop a yeast infection while on antibiotics.  Please call us  if not better in 7 to 10 days or sooner if worse.  Continue to monitor your Accu-Cheks carefully.

## 2023-06-05 ENCOUNTER — Other Ambulatory Visit: Payer: Self-pay | Admitting: Internal Medicine

## 2023-06-05 DIAGNOSIS — Z1231 Encounter for screening mammogram for malignant neoplasm of breast: Secondary | ICD-10-CM

## 2023-06-25 ENCOUNTER — Ambulatory Visit
Admission: RE | Admit: 2023-06-25 | Discharge: 2023-06-25 | Disposition: A | Discharge status: Home / Self Care | Payer: 59 | Source: Ambulatory Visit | Attending: Internal Medicine | Admitting: Internal Medicine

## 2023-06-25 DIAGNOSIS — Z1231 Encounter for screening mammogram for malignant neoplasm of breast: Secondary | ICD-10-CM

## 2023-07-11 ENCOUNTER — Ambulatory Visit: Payer: 59 | Admitting: Internal Medicine

## 2023-07-20 ENCOUNTER — Other Ambulatory Visit

## 2023-07-25 ENCOUNTER — Ambulatory Visit: Payer: 59 | Admitting: Internal Medicine

## 2023-07-25 ENCOUNTER — Encounter: Payer: Self-pay | Admitting: Internal Medicine

## 2023-07-25 VITALS — BP 128/74 | HR 85 | Ht 63.5 in | Wt 267.0 lb

## 2023-07-25 DIAGNOSIS — E1165 Type 2 diabetes mellitus with hyperglycemia: Secondary | ICD-10-CM

## 2023-07-25 DIAGNOSIS — E119 Type 2 diabetes mellitus without complications: Secondary | ICD-10-CM | POA: Insufficient documentation

## 2023-07-25 DIAGNOSIS — Z794 Long term (current) use of insulin: Secondary | ICD-10-CM

## 2023-07-25 LAB — POCT GLYCOSYLATED HEMOGLOBIN (HGB A1C): Hemoglobin A1C: 5.9 % — AB (ref 4.0–5.6)

## 2023-07-25 MED ORDER — TIRZEPATIDE 7.5 MG/0.5ML ~~LOC~~ SOAJ: 7.5000 mg | SUBCUTANEOUS | 3 refills | Status: DC

## 2023-07-25 MED ORDER — LANTUS SOLOSTAR 100 UNIT/ML ~~LOC~~ SOPN: 30.0000 [IU] | PEN_INJECTOR | Freq: Every day | SUBCUTANEOUS | 3 refills | Status: DC

## 2023-07-25 NOTE — Patient Instructions (Signed)
 Increase Mounjaro 7.5 mg once weekly Decrease Lantus 28 units daily     HOW TO TREAT LOW BLOOD SUGARS (Blood sugar LESS THAN 70 MG/DL) Please follow the RULE OF 15 for the treatment of hypoglycemia treatment (when your (blood sugars are less than 70 mg/dL)   STEP 1: Take 15 grams of carbohydrates when your blood sugar is low, which includes:  3-4 GLUCOSE TABS  OR 3-4 OZ OF JUICE OR REGULAR SODA OR ONE TUBE OF GLUCOSE GEL    STEP 2: RECHECK blood sugar in 15 MINUTES STEP 3: If your blood sugar is still low at the 15 minute recheck --> then, go back to STEP 1 and treat AGAIN with another 15 grams of carbohydrates.

## 2023-07-25 NOTE — Progress Notes (Signed)
 Name: Crystal Wallace  MRN/ DOB: [id number], [birth year]   Age/ Sex: 48 y.o., female    PCP: Margaree Mackintosh, MD   Reason for Endocrinology Evaluation: Type 2 Diabetes Mellitus     Date of Initial Endocrinology Visit: 01/11/2023    PATIENT IDENTIFIER: Crystal Wallace is a 48 y.o. female with a past medical history of HTN, DM and dyslipidemia. The patient presented for initial endocrinology clinic visit on 01/11/2023 for consultative assistance with her diabetes management.    HPI: Crystal Wallace was    Diagnosed with DM 2014 Prior Medications tried/Intolerance: Metformin- stomach pain/diarrhea. Insulin started 06/2022  Hemoglobin A1c has ranged from 5.9% in 2016, peaking at 11.7% in 2023.   Patient on Mirena  In reviewing her chart she had a normal renin, aldosterone, and Aldo/renin ratio 11/2017   Works 12 hr shifts. Goes to bed before 10 pm   On her initial visit to our clinic she was on NPH 20 units twice daily with an A1c of 10.2% I increased her insulin and started her on Mounjaro   I also want to screen her for Cushing syndrome with saliva cortisol   SUBJECTIVE:   During the last visit (04/12/2023): A1c 6.9%  Today (07/25/23): Crystal Wallace is here for follow-up on diabetes management.  She checks her blood sugars multiple times daily. The patient has  had hypoglycemic episodes since the last clinic visit.   Denies nausea or vomiting  No constipation or diarrhea    HOME DIABETES REGIMEN: Mounjaro 5 mg weekly Lantus 40 units daily      Statin: yes ACE-I/ARB: yes  CONTINUOUS GLUCOSE MONITORING RECORD INTERPRETATION    Dates of Recording: 3/6-3/19/2025  Sensor description:dexcom  Results statistics:   CGM use % of time 95  Average and SD 149/29  Time in range   88%  % Time Above 180 11  % Time above 250 1  % Time Below target 0   Glycemic patterns summary: BGs are optimal overnight and between the meals  Hyperglycemic episodes postprandial-mainly  supper  Hypoglycemic episodes occurred N/A  Overnight periods: Optimal      DIABETIC COMPLICATIONS: Microvascular complications:   Denies: CKD, neuropathy, retinopathy Last eye exam: Completed 01/2022  Macrovascular complications:   Denies: CAD, PVD, CVA   PAST HISTORY: Past Medical History:  Past Medical History:  Diagnosis Date   Allergy    Depression    Diabetes (HCC)    HTN (hypertension)    Hx of blood clots    Hyperlipidemia    Lactose intolerance    Vitamin D deficiency    Past Surgical History:  Past Surgical History:  Procedure Laterality Date    c section  1999   adenoid removal age 16     cervical 5 and cervical 6 neck surgery  12-07-2010   ROBOT ASSISTED MYOMECTOMY  10/20/2011   Procedure: ROBOTIC ASSISTED MYOMECTOMY;  Surgeon: Antionette Char, MD;  Location: [facility];  Service: Gynecology;  Laterality: N/A;    Social History:  reports that she has never smoked. She has never used smokeless tobacco. She reports that she does not drink alcohol and does not use drugs. Family History:  Family History  Problem Relation Age of Onset   Arthritis Mother    Heart disease Mother    Hyperlipidemia Mother    Hypertension Father    Diabetes Father    Obesity Father    Colon cancer Neg Hx    Esophageal cancer Neg Hx  Rectal cancer Neg Hx    Stomach cancer Neg Hx    Breast cancer Neg Hx      HOME MEDICATIONS: Allergies as of 07/25/2023   No Known Allergies      Medication List        Accurate as of July 25, 2023  1:42 PM. If you have any questions, ask your nurse or doctor.          amLODipine 5 MG tablet Commonly known as: NORVASC Take 1 tablet (5 mg total) by mouth daily.   carvedilol 12.5 MG tablet Commonly known as: COREG Take 1 tablet (12.5 mg total) by mouth 2 (two) times daily with a meal.   Dexcom G7 Sensor Misc 1 Device by Does not apply route as directed.   furosemide 40 MG tablet Commonly known as: LASIX TAKE 1 TABLET(40  MG) BY MOUTH DAILY   Insulin Pen Needle 31G X 5 MM Misc 1 Device by Does not apply route in the morning and at bedtime.   Lantus SoloStar 100 UNIT/ML Solostar Pen Generic drug: insulin glargine Inject 40 units into skin daily What changed: Another medication with the same name was removed. Continue taking this medication, and follow the directions you see here. Changed by: Johnney Ou Kamiah Fite   levonorgestrel 20 MCG/24HR IUD Commonly known as: MIRENA 1 each by Intrauterine route once.   multivitamin with minerals Tabs tablet Take 1 tablet by mouth daily.   NON FORMULARY Diabetic Health Pack from [facility] Made   olmesartan 40 MG tablet Commonly known as: BENICAR Take 1 tablet (40 mg total) by mouth daily.   simvastatin 40 MG tablet Commonly known as: ZOCOR Take 1 tablet (40 mg total) by mouth at bedtime.   tirzepatide 5 MG/0.5ML Pen Commonly known as: MOUNJARO Inject 5 mg into the skin once a week.         ALLERGIES: No Known Allergies   REVIEW OF SYSTEMS: A comprehensive ROS was conducted with the patient and is negative except as per HPI     OBJECTIVE:   VITAL SIGNS: BP 128/74 (BP Location: Left Arm, Patient Position: Sitting, Cuff Size: Normal)   Pulse 85   Ht 5' 3.5" (1.613 m)   Wt 267 lb (121.1 kg)   SpO2 96%   BMI 46.56 kg/m    PHYSICAL EXAM:  General: Pt appears well and is in NAD  Neck: Thyroid: Thyroid size normal.  No goiter or nodules appreciated.   Lungs: Clear with good BS bilat   Heart: RRR   Extremities:  Lower extremities - No pretibial edema.   Neuro: MS is good with appropriate affect, pt is alert and Ox3    DM foot exam: 01/11/2023  The skin of the feet is intact without sores or ulcerations. The pedal pulses are 2+ on right and 2+ on left. The sensation is intact to a screening 5.07, 10 gram monofilament bilaterally    DATA REVIEWED:  Lab Results  Component Value Date   HGBA1C 6.9 (A) 04/12/2023   HGBA1C 8.5 (H) 03/02/2023    HGBA1C 10.2 (A) 01/11/2023    Latest Reference Range & Units 03/02/23 11:11  Sodium 135 - 146 mmol/L 141  Potassium 3.5 - 5.3 mmol/L 4.2  Chloride 98 - 110 mmol/L 106  CO2 20 - 32 mmol/L 27  Glucose 65 - 99 mg/dL 161 (H)  Mean Plasma Glucose mg/dL 096  BUN 7 - 25 mg/dL 10  Creatinine 0.45 - 4.09 mg/dL 8.11  Calcium 8.6 -  10.2 mg/dL 8.8  BUN/Creatinine Ratio 6 - 22 (calc) SEE NOTE:  eGFR > OR = 60 mL/min/1.69m2 117  AG Ratio 1.0 - 2.5 (calc) 1.8  AST 10 - 35 U/L 10  ALT 6 - 29 U/L 12  Total Protein 6.1 - 8.1 g/dL 6.4  Total Bilirubin 0.2 - 1.2 mg/dL 1.0  Total CHOL/HDL Ratio <5.0 (calc) 3.7  Cholesterol <200 mg/dL 161  HDL Cholesterol > OR = 50 mg/dL 38 (L)  LDL Cholesterol (Calc) mg/dL (calc) 84  Non-HDL Cholesterol (Calc) <130 mg/dL (calc) 096  Triglycerides <150 mg/dL 94    Old records , labs and images have been reviewed.    ASSESSMENT / PLAN / RECOMMENDATIONS:   1) Type 2 Diabetes Mellitus, Optimally controlled, Without complications - Most recent A1c of 5.9 %. Goal A1c < 7.0 %.     -A1c has trended down from 10.2% to 6.9% -Intolerant to metformin -She is tolerating Mounjaro, will increase -I will also decrease Lantus due to hypoglycemia   MEDICATIONS: Increase Mounjaro 7.5 mg weekly Decrease Lantus 28 units daily  EDUCATION / INSTRUCTIONS: BG monitoring instructions: Patient is instructed to check her blood sugars 3 times a day. Call [place] [facility] if: BG persistently < 70  I reviewed the Rule of 15 for the treatment of hypoglycemia in detail with the patient. Literature supplied.   2) Diabetic complications:  Eye: Does not have known diabetic retinopathy.  Neuro/ Feet: Does not have known diabetic peripheral neuropathy. Renal: Patient does not have known baseline CKD. She is  on an ACEI/ARB at present.    Follow-up in 6 months   Signed electronically by: Lyndle Herrlich, MD  [facility]  [facility] [address] [place], [place] [address] Phone: [phone] FAX: [phone]   CC: Margaree Mackintosh, MD [address] [place] [address] Phone: [phone]  Fax: [phone]    Return to Endocrinology clinic as below: No future appointments.

## 2023-07-26 ENCOUNTER — Encounter: Payer: Self-pay | Admitting: Internal Medicine

## 2023-07-26 LAB — MICROALBUMIN / CREATININE URINE RATIO
Creatinine, Urine: 47 mg/dL (ref 20–275)
Microalb Creat Ratio: 317 mg/g{creat} — ABNORMAL HIGH (ref <30)
Microalb, Ur: 14.9 mg/dL

## 2023-07-29 LAB — CORTISOL, SALIVARY
Cortisol, Saliva: 0.03 ug/dL
Cortisol, Saliva: 0.04 ug/dL

## 2023-07-30 ENCOUNTER — Encounter: Payer: Self-pay | Admitting: Internal Medicine

## 2023-07-31 ENCOUNTER — Encounter: Payer: Self-pay | Admitting: Internal Medicine

## 2023-07-31 ENCOUNTER — Ambulatory Visit: Admitting: Internal Medicine

## 2023-07-31 VITALS — BP 132/82 | HR 79 | Temp 99.2°F | Resp 12 | Ht 63.5 in | Wt 267.0 lb

## 2023-07-31 DIAGNOSIS — J0100 Acute maxillary sinusitis, unspecified: Secondary | ICD-10-CM | POA: Diagnosis not present

## 2023-07-31 DIAGNOSIS — R519 Headache, unspecified: Secondary | ICD-10-CM | POA: Diagnosis not present

## 2023-07-31 DIAGNOSIS — R059 Cough, unspecified: Secondary | ICD-10-CM

## 2023-07-31 DIAGNOSIS — E119 Type 2 diabetes mellitus without complications: Secondary | ICD-10-CM

## 2023-07-31 LAB — POC COVID19/FLU A&B COMBO
Covid Antigen, POC: NEGATIVE
Influenza A Antigen, POC: NEGATIVE
Influenza B Antigen, POC: NEGATIVE

## 2023-07-31 MED ORDER — FLUCONAZOLE 150 MG PO TABS
150.0000 mg | ORAL_TABLET | Freq: Once | ORAL | 0 refills | Status: AC
Start: 2023-07-31 — End: 2023-07-31

## 2023-07-31 MED ORDER — DOXYCYCLINE HYCLATE 100 MG PO TABS: 100.0000 mg | ORAL_TABLET | Freq: Two times a day (BID) | ORAL | 0 refills | Status: DC

## 2023-07-31 NOTE — Progress Notes (Shared)
 Patient Care Team: Margaree Mackintosh, MD as PCP - General (Internal Medicine) Jodelle Red, MD as PCP - Cardiology (Cardiology) Jodelle Red, MD as Consulting Physician (Cardiology)  Visit Date: 07/31/23  Subjective:   Chief Complaint  Patient presents with   Sinusitis   Headache   sneezing   Cough  Patient UJ:WJXBJYN D Lempke,Female DOB:17-Dec-1975,48 y.o. WGN:[id number]   48 y.o. Female presents today for acute sick visit with Headache, Cough, Sneezing. Patient has a past medical history of DMII; Obesity. She says that for the past couple of days she's had nasal congestion, sneezing, and starting yesterday productive cough with green discolored, thick sputum.  History of Diabetes Mellitus, type II treated with Mounjaro 7.5 mg weekly and Lantus 30 units daily. Followed by Dr. Lonzo Cloud, who she last saw on 3/19 and her hgbA1c that day was HgbA1c 5.9 on 07/25/2023. She says that her sugars have remained stable at-home, monitored by her Dexcom, which she checks often. Also says she is UTD on her eye exam, last done 04/2023. BMI 46.56, 267 pounds today.   Past Medical History:  Diagnosis Date   Allergy    Depression    Diabetes (HCC)    HTN (hypertension)    Hx of blood clots    Hyperlipidemia    Lactose intolerance    Vitamin D deficiency   No Known Allergies  Family History  Problem Relation Age of Onset   Arthritis Mother    Heart disease Mother    Hyperlipidemia Mother    Hypertension Father    Diabetes Father    Obesity Father    Colon cancer Neg Hx    Esophageal cancer Neg Hx    Rectal cancer Neg Hx    Stomach cancer Neg Hx    Breast cancer Neg Hx    Social History   Social History Narrative   Not on file   Review of Systems  HENT:  Positive for congestion (nasal).        (+) Sneezing  Respiratory:  Positive for cough and sputum production (green, thick).   Neurological:  Positive for headaches.  All other systems reviewed and are negative.     Objective:  Vitals: BP 132/82 (BP Location: Left Arm, Patient Position: Sitting, Cuff Size: Large)   Pulse 79   Temp 99.2 F (37.3 C) (Temporal)   Resp 12   Ht 5' 3.5" (1.613 m)   Wt 267 lb (121.1 kg)   SpO2 98%   BMI 46.56 kg/m   Physical Exam Vitals and nursing note reviewed.  Constitutional:      General: She is not in acute distress.    Appearance: Normal appearance. She is not ill-appearing.  HENT:     Head: Normocephalic and atraumatic.     Right Ear: Ear canal and external ear normal. Tympanic membrane is erythematous (pink).     Left Ear: Ear canal and external ear normal. Tympanic membrane is erythematous (slightly pink).     Ears:     Comments: RIGHT TM: slightly dull, splayed light reflex, and pink LEFT TM: slightly dull, splayed light reflex, and slightly pink       Mouth/Throat:     Mouth: Mucous membranes are moist.     Pharynx: Oropharynx is clear. No oropharyngeal exudate or posterior oropharyngeal erythema.  Pulmonary:     Effort: Pulmonary effort is normal.     Breath sounds: Normal breath sounds. No wheezing, rhonchi or rales.  Lymphadenopathy:     Cervical: No  cervical adenopathy.  Skin:    General: Skin is warm and dry.  Neurological:     Mental Status: She is alert and oriented to person, place, and time. Mental status is at baseline.  Psychiatric:        Mood and Affect: Mood normal.        Behavior: Behavior normal.        Thought Content: Thought content normal.        Judgment: Judgment normal.     Results:  Studies Obtained And Personally Reviewed By Me: Labs:     Component Value Date/Time   NA 141 03/02/2023 1111   NA 139 12/06/2017 1036   K 4.2 03/02/2023 1111   CL 106 03/02/2023 1111   CO2 27 03/02/2023 1111   GLUCOSE 144 (H) 03/02/2023 1111   BUN 10 03/02/2023 1111   BUN 11 12/06/2017 1036   CREATININE 0.51 03/02/2023 1111   CALCIUM 8.8 03/02/2023 1111   PROT 6.4 03/02/2023 1111   PROT 6.8 12/06/2017 1036   ALBUMIN 4.4  12/06/2017 1036   AST 10 03/02/2023 1111   ALT 12 03/02/2023 1111   ALKPHOS 83 12/06/2017 1036   BILITOT 1.0 03/02/2023 1111   BILITOT 0.7 12/06/2017 1036   GFRNONAA 116 12/06/2017 1036   GFRNONAA 110 07/10/2017 0908   GFRAA 134 12/06/2017 1036   GFRAA 128 07/10/2017 0908    Lab Results  Component Value Date   WBC 11.0 (H) 03/02/2023   HGB 12.2 03/02/2023   HCT 36.6 03/02/2023   MCV 90.1 03/02/2023   PLT 366 03/02/2023   Lab Results  Component Value Date   CHOL 141 03/02/2023   HDL 38 (L) 03/02/2023   LDLCALC 84 03/02/2023   TRIG 94 03/02/2023   CHOLHDL 3.7 03/02/2023   Lab Results  Component Value Date   HGBA1C 5.9 (A) 07/25/2023    Lab Results  Component Value Date   TSH 1.480 12/06/2017    Results for orders placed or performed in visit on 07/31/23  POC Covid19/Flu A&B Antigen  Result Value Ref Range   Influenza A Antigen, POC Negative Negative   Influenza B Antigen, POC Negative Negative   Covid Antigen, POC Negative Negative   Assessment & Plan:  Acute Maxillary Sinusitis: w/ nasal congestion, sneezing, and productive cough with green discolored, thick sputum. Covid-19 & Flu A/B NEGATIVE in-office today. Sending in 100 mg Doxycycline - take 1 tablet (100 mg total) by mouth 2 (two) times daily and 150 mg Diflucan in case she develops Candida vaginitis. Work note provided. If symptoms persist/worsen despite treatment contact us.   Diabetes Mellitus, type II well-controlled with Mounjaro 7.5 mg weekly and Lantus 30 units daily. Followed by Dr. Lonzo Cloud, who she last saw on 3/19 and her hgbA1c that day was HgbA1c 5.9 on 07/25/2023. She says that her sugars have remained stable at-home, monitored by her Dexcom, which she checks often. UTD on her eye exam, last done 04/2023. Continue Endocrinology follow-up.   BMI 46.56, 267 pounds today.   Plan: Continue close follow up with Endocrinology and Take Doxycycline for 10 days as prescribed today for sinusitis.  I,Emily  Lagle,acting as a Neurosurgeon for Margaree Mackintosh, MD.,have documented all relevant documentation on the behalf of Margaree Mackintosh, MD,as directed by  Margaree Mackintosh, MD while in the presence of Margaree Mackintosh, MD.   I, Margaree Mackintosh, MD, have reviewed all documentation for this visit. The documentation on 08/01/23 for the exam, diagnosis,  procedures, and orders are all accurate and complete.

## 2023-08-01 NOTE — Patient Instructions (Signed)
 We are sorry you are not feeling well.  Please take the acyclovir 100 mg twice daily for 10 days.  Rest and stay well-hydrated.  Monitor glucose readings with Dexcom device.  Call if not improving within 48 hours or sooner if worse.

## 2023-08-15 ENCOUNTER — Encounter: Payer: Self-pay | Admitting: Internal Medicine

## 2023-08-16 ENCOUNTER — Other Ambulatory Visit: Payer: Self-pay

## 2023-08-16 MED ORDER — ACCU-CHEK GUIDE TEST VI STRP: ORAL_STRIP | 12 refills | Status: AC

## 2023-08-16 MED ORDER — ACCU-CHEK GUIDE W/DEVICE KIT: PACK | 0 refills | Status: AC

## 2023-08-16 MED ORDER — ACCU-CHEK SOFTCLIX LANCETS MISC: 12 refills | Status: AC

## 2023-08-16 NOTE — Telephone Encounter (Signed)
 Marland Kitchen

## 2023-08-21 ENCOUNTER — Other Ambulatory Visit: Payer: Self-pay | Admitting: Internal Medicine

## 2023-09-24 ENCOUNTER — Other Ambulatory Visit: Payer: Self-pay | Admitting: Internal Medicine

## 2023-10-12 ENCOUNTER — Encounter: Payer: Self-pay | Admitting: Internal Medicine

## 2023-10-20 ENCOUNTER — Encounter: Payer: Self-pay | Admitting: Internal Medicine

## 2023-10-29 ENCOUNTER — Encounter: Payer: Self-pay | Admitting: Internal Medicine

## 2023-11-17 ENCOUNTER — Other Ambulatory Visit: Payer: Self-pay | Admitting: Internal Medicine

## 2023-12-18 ENCOUNTER — Encounter: Payer: Self-pay | Admitting: Internal Medicine

## 2023-12-19 ENCOUNTER — Other Ambulatory Visit: Payer: Self-pay

## 2023-12-19 MED ORDER — AMLODIPINE BESYLATE 5 MG PO TABS: 5.0000 mg | ORAL_TABLET | Freq: Every day | ORAL | 1 refills | Status: DC

## 2024-01-23 ENCOUNTER — Other Ambulatory Visit: Payer: Self-pay | Admitting: Internal Medicine

## 2024-01-27 ENCOUNTER — Encounter: Payer: Self-pay | Admitting: Internal Medicine

## 2024-01-28 ENCOUNTER — Other Ambulatory Visit: Payer: Self-pay

## 2024-01-28 ENCOUNTER — Ambulatory Visit: Admitting: Internal Medicine

## 2024-01-28 MED ORDER — DEXCOM G7 SENSOR MISC: 1.0000 | 3 refills | Status: DC

## 2024-01-29 ENCOUNTER — Other Ambulatory Visit: Payer: Self-pay

## 2024-01-29 ENCOUNTER — Telehealth: Payer: Self-pay

## 2024-01-29 ENCOUNTER — Other Ambulatory Visit (HOSPITAL_COMMUNITY): Payer: Self-pay

## 2024-01-29 MED ORDER — DEXCOM G7 SENSOR MISC: 3 refills | Status: AC

## 2024-01-29 NOTE — Telephone Encounter (Signed)
 Pharmacy Patient Advocate Encounter   Received notification from Pt Calls Messages that prior authorization for Dexcom G7 sensor is required/requested.   Insurance verification completed.   The patient is insured through [employer] [facility] .   Per test claim: PA required and submitted KEY/EOC/Request #: [id number] from 01/29/24 to 01/28/25. Ran test claim, Copay is $35. This test claim was processed through [facility]- copay amounts may vary at other pharmacies due to pharmacy/plan contracts, or as the patient moves through the different stages of their insurance plan.

## 2024-01-29 NOTE — Telephone Encounter (Signed)
 Dexcom needs PA

## 2024-02-13 ENCOUNTER — Ambulatory Visit: Admitting: Internal Medicine

## 2024-02-13 ENCOUNTER — Encounter: Payer: Self-pay | Admitting: Internal Medicine

## 2024-02-13 VITALS — BP 124/76 | HR 81 | Ht 63.5 in | Wt 277.8 lb

## 2024-02-13 DIAGNOSIS — E1129 Type 2 diabetes mellitus with other diabetic kidney complication: Secondary | ICD-10-CM | POA: Insufficient documentation

## 2024-02-13 DIAGNOSIS — Z794 Long term (current) use of insulin: Secondary | ICD-10-CM

## 2024-02-13 DIAGNOSIS — E119 Type 2 diabetes mellitus without complications: Secondary | ICD-10-CM

## 2024-02-13 DIAGNOSIS — R809 Proteinuria, unspecified: Secondary | ICD-10-CM | POA: Diagnosis not present

## 2024-02-13 LAB — POCT GLYCOSYLATED HEMOGLOBIN (HGB A1C): Hemoglobin A1C: 6.4 % — AB (ref 4.0–5.6)

## 2024-02-13 MED ORDER — LANTUS SOLOSTAR 100 UNIT/ML ~~LOC~~ SOPN: 28.0000 [IU] | PEN_INJECTOR | Freq: Every day | SUBCUTANEOUS | 3 refills | Status: AC

## 2024-02-13 MED ORDER — TIRZEPATIDE 10 MG/0.5ML ~~LOC~~ SOAJ: 10.0000 mg | SUBCUTANEOUS | 3 refills | Status: AC

## 2024-02-13 MED ORDER — INSULIN PEN NEEDLE 31G X 5 MM MISC
1.0000 | Freq: Every day | 3 refills | Status: AC
Start: 2024-02-13 — End: ?

## 2024-02-13 NOTE — Patient Instructions (Signed)
 Increase Mounjaro  10 mg once weekly Continue  Lantus  28 units daily     HOW TO TREAT LOW BLOOD SUGARS (Blood sugar LESS THAN 70 MG/DL) Please follow the RULE OF 15 for the treatment of hypoglycemia treatment (when your (blood sugars are less than 70 mg/dL)   STEP 1: Take 15 grams of carbohydrates when your blood sugar is low, which includes:  3-4 GLUCOSE TABS  OR 3-4 OZ OF JUICE OR REGULAR SODA OR ONE TUBE OF GLUCOSE GEL    STEP 2: RECHECK blood sugar in 15 MINUTES STEP 3: If your blood sugar is still low at the 15 minute recheck --> then, go back to STEP 1 and treat AGAIN with another 15 grams of carbohydrates.

## 2024-02-13 NOTE — Progress Notes (Unsigned)
 Name: Crystal Wallace  MRN/ DOB: [id number], 1976-01-21   Age/ Sex: 48 y.o., female    PCP: Perri Ronal PARAS, MD   Reason for Endocrinology Evaluation: Type 2 Diabetes Mellitus     Date of Initial Endocrinology Visit: 01/11/2023    PATIENT IDENTIFIER: Crystal Wallace is a 48 y.o. female with a past medical history of HTN, DM and dyslipidemia. The patient presented for initial endocrinology clinic visit on 01/11/2023 for consultative assistance with her diabetes management.    HPI: Crystal Wallace was    Diagnosed with DM 2014 Prior Medications tried/Intolerance: Metformin - stomach pain/diarrhea. Insulin  started 06/2022  Hemoglobin A1c has ranged from 5.9% in 2016, peaking at 11.7% in 2023.   Patient on Mirena   In reviewing her chart she had a normal renin, aldosterone, and Aldo/renin ratio 11/2017   Works 12 hr shifts. Goes to bed before 10 pm   On her initial visit to our clinic she was on NPH 20 units twice daily with an A1c of 10.2% I increased her insulin  and started her on Mounjaro    Saliva very cortisol which was normal in March, 2025   SUBJECTIVE:   During the last visit (07/25/2023): A1c 5.9%     Today (02/13/24): Crystal Wallace is here for follow-up on diabetes management.  She checks her blood sugars multiple times daily. The patient has not had hypoglycemic episodes since the last clinic visit.    No nausea or vomiting  No constipation or diarrhea   HOME DIABETES REGIMEN: Mounjaro  7.5 mg weekly Lantus  28 units daily      Statin: yes ACE-I/ARB: yes  CONTINUOUS GLUCOSE MONITORING RECORD INTERPRETATION    Dates of Recording: 9/25-10/12/2023  Sensor description:dexcom  Results statistics:   CGM use % of time 89  Average and SD 171/36  Time in range 63%  % Time Above 180 35  % Time above 250 2  % Time Below target 0   Glycemic patterns summary: BGs are optimal overnight but fluctuate during the day  Hyperglycemic episodes postprandial  Hypoglycemic  episodes occurred N/A  Overnight periods: Optimal      DIABETIC COMPLICATIONS: Microvascular complications:   Denies: CKD, neuropathy, retinopathy Last eye exam: Completed 04/2023  Macrovascular complications:   Denies: CAD, PVD, CVA   PAST HISTORY: Past Medical History:  Past Medical History:  Diagnosis Date   Allergy    Depression    Diabetes (HCC)    HTN (hypertension)    Hx of blood clots    Hyperlipidemia    Lactose intolerance    Vitamin D  deficiency    Past Surgical History:  Past Surgical History:  Procedure Laterality Date    c section  1999   adenoid removal age 48     cervical 5 and cervical 6 neck surgery  12-07-2010   ROBOT ASSISTED MYOMECTOMY  10/20/2011   Procedure: ROBOTIC ASSISTED MYOMECTOMY;  Surgeon: Olam Mill, MD;  Location: [facility];  Service: Gynecology;  Laterality: N/A;    Social History:  reports that she has never smoked. She has never used smokeless tobacco. She reports that she does not drink alcohol and does not use drugs. Family History:  Family History  Problem Relation Age of Onset   Arthritis Mother    Heart disease Mother    Hyperlipidemia Mother    Hypertension Father    Diabetes Father    Obesity Father    Colon cancer Neg Hx    Esophageal cancer Neg Hx  Rectal cancer Neg Hx    Stomach cancer Neg Hx    Breast cancer Neg Hx      HOME MEDICATIONS: Allergies as of 02/13/2024   No Known Allergies      Medication List        Accurate as of February 13, 2024  8:09 AM. If you have any questions, ask your nurse or doctor.          Accu-Chek Guide Test test strip Generic drug: glucose blood Use to check blood sugar 2-3 times daily   Accu-Chek Guide w/Device Kit Use to check blood sugars 2-3 times daily   Accu-Chek Softclix Lancets lancets Check blood sugars 2-3 times daily   amLODipine  5 MG tablet Commonly known as: NORVASC  Take 1 tablet (5 mg total) by mouth daily.   carvedilol  12.5 MG  tablet Commonly known as: COREG  TAKE 1 TABLET (12.5MG  TOTAL) BY MOUTH TWICE A DAY WITH MEALS   Dexcom G7 Sensor Misc Change sensor every 10 days   doxycycline  100 MG tablet Commonly known as: VIBRA -TABS Take 1 tablet (100 mg total) by mouth 2 (two) times daily.   furosemide  40 MG tablet Commonly known as: LASIX  TAKE 1 TABLET(40 MG) BY MOUTH DAILY   Insulin  Pen Needle 31G X 5 MM Misc 1 Device by Does not apply route in the morning and at bedtime.   Lantus  SoloStar 100 UNIT/ML Solostar Pen Generic drug: insulin  glargine Inject 30 Units into the skin daily.   levonorgestrel  20 MCG/24HR IUD Commonly known as: MIRENA  1 each by Intrauterine route once.   multivitamin with minerals Tabs tablet Take 1 tablet by mouth daily.   NON FORMULARY Diabetic Health Pack from [facility]   olmesartan  40 MG tablet Commonly known as: BENICAR  TAKE 1 TABLET BY MOUTH EVERY DAY   simvastatin  40 MG tablet Commonly known as: ZOCOR  Take 1 tablet (40 mg total) by mouth at bedtime.   tirzepatide  7.5 MG/0.5ML Pen Commonly known as: MOUNJARO  Inject 7.5 mg into the skin once a week.         ALLERGIES: No Known Allergies   REVIEW OF SYSTEMS: A comprehensive ROS was conducted with the patient and is negative except as per HPI     OBJECTIVE:   VITAL SIGNS: BP 124/76 (BP Location: Left Arm, Patient Position: Sitting, Cuff Size: Large)   Pulse 81   Ht 5' 3.5 (1.613 m)   Wt 277 lb 12.8 oz (126 kg)   SpO2 97%   BMI 48.44 kg/m      PHYSICAL EXAM:  General: Pt appears well and is in NAD  Neck: Thyroid: Thyroid size normal.  No goiter or nodules appreciated.   Lungs: Clear with good BS bilat   Heart: RRR   Extremities:  Lower extremities - No pretibial edema.   Neuro: MS is good with appropriate affect, pt is alert and Ox3    DM foot exam: 02/13/2024  The skin of the feet is intact without sores or ulcerations. The pedal pulses are 2+ on right and 2+ on left. The sensation is  intact to a screening 5.07, 10 gram monofilament bilaterally    DATA REVIEWED:  Lab Results  Component Value Date   HGBA1C 5.9 (A) 07/25/2023   HGBA1C 6.9 (A) 04/12/2023   HGBA1C 8.5 (H) 03/02/2023    Latest Reference Range & Units 03/02/23 11:11  Sodium 135 - 146 mmol/L 141  Potassium 3.5 - 5.3 mmol/L 4.2  Chloride 98 - 110 mmol/L 106  CO2  20 - 32 mmol/L 27  Glucose 65 - 99 mg/dL 855 (H)  Mean Plasma Glucose mg/dL 802  BUN 7 - 25 mg/dL 10  Creatinine 9.49 - 9.00 mg/dL 9.48  Calcium 8.6 - 89.7 mg/dL 8.8  BUN/Creatinine Ratio 6 - 22 (calc) SEE NOTE:  eGFR > OR = 60 mL/min/1.73m2 117  AG Ratio 1.0 - 2.5 (calc) 1.8  AST 10 - 35 U/L 10  ALT 6 - 29 U/L 12  Total Protein 6.1 - 8.1 g/dL 6.4  Total Bilirubin 0.2 - 1.2 mg/dL 1.0  Total CHOL/HDL Ratio <5.0 (calc) 3.7  Cholesterol <200 mg/dL 858  HDL Cholesterol > OR = 50 mg/dL 38 (L)  LDL Cholesterol (Calc) mg/dL (calc) 84  Non-HDL Cholesterol (Calc) <130 mg/dL (calc) 896  Triglycerides <150 mg/dL 94    Old records , labs and images have been reviewed.    ASSESSMENT / PLAN / RECOMMENDATIONS:   1) Type 2 Diabetes Mellitus, Optimally controlled, With microalbuminuria complications - Most recent A1c of 6.4%. Goal A1c < 7.0 %.     -A1c has trended down from 10.2% to 6.9% -Intolerant to metformin  -She is tolerating Mounjaro , will increase -I will also decrease Lantus  due to hypoglycemia   MEDICATIONS: Increase Mounjaro  7.5 mg weekly Decrease Lantus  28 units daily  EDUCATION / INSTRUCTIONS: BG monitoring instructions: Patient is instructed to check her blood sugars 3 times a day. Call [facility] if: BG persistently < 70  I reviewed the Rule of 15 for the treatment of hypoglycemia in detail with the patient. Literature supplied.   2) Diabetic complications:  Eye: Does not have known diabetic retinopathy.  Neuro/ Feet: Does not have known diabetic peripheral neuropathy. Renal: Patient does not have known  baseline CKD. She is  on an ACEI/ARB at present.    Follow-up in 6 months   Signed electronically by: Stefano Redgie Butts, MD  [facility]  [facility] [address] [place], [place] [address] Phone: [phone] FAX: [phone]   CC: Perri Ronal PARAS, MD [address] [place] [address] Phone: [phone]  Fax: [phone]    Return to Endocrinology clinic as below: Future Appointments  Date Time Provider Department Center  02/13/2024  8:10 AM Faelyn Sigler, Donell Redgie, MD [id number] None

## 2024-02-14 ENCOUNTER — Ambulatory Visit: Payer: Self-pay | Admitting: Internal Medicine

## 2024-02-14 LAB — MICROALBUMIN / CREATININE URINE RATIO
Creatinine, Urine: 24 mg/dL (ref 20–275)
Microalb Creat Ratio: 58 mg/g{creat} — ABNORMAL HIGH (ref <30)
Microalb, Ur: 1.4 mg/dL

## 2024-02-15 ENCOUNTER — Other Ambulatory Visit: Payer: Self-pay | Admitting: Internal Medicine

## 2024-03-28 ENCOUNTER — Other Ambulatory Visit: Payer: Self-pay | Admitting: Internal Medicine

## 2024-04-09 ENCOUNTER — Ambulatory Visit: Payer: Self-pay

## 2024-04-09 ENCOUNTER — Ambulatory Visit: Admitting: Internal Medicine

## 2024-04-09 VITALS — BP 140/80 | HR 83 | Temp 100.7°F | Ht 63.5 in | Wt 277.0 lb

## 2024-04-09 DIAGNOSIS — Z794 Long term (current) use of insulin: Secondary | ICD-10-CM

## 2024-04-09 DIAGNOSIS — E119 Type 2 diabetes mellitus without complications: Secondary | ICD-10-CM

## 2024-04-09 DIAGNOSIS — J22 Unspecified acute lower respiratory infection: Secondary | ICD-10-CM

## 2024-04-09 DIAGNOSIS — I10 Essential (primary) hypertension: Secondary | ICD-10-CM

## 2024-04-09 DIAGNOSIS — Z6841 Body Mass Index (BMI) 40.0 and over, adult: Secondary | ICD-10-CM

## 2024-04-09 LAB — POC COVID19/FLU A&B COMBO
Covid Antigen, POC: NEGATIVE
Influenza A Antigen, POC: NEGATIVE
Influenza B Antigen, POC: NEGATIVE

## 2024-04-09 MED ORDER — AZITHROMYCIN 250 MG PO TABS: ORAL_TABLET | ORAL | 0 refills | Status: AC

## 2024-04-09 MED ORDER — BENZONATATE 100 MG PO CAPS: 100.0000 mg | ORAL_CAPSULE | Freq: Three times a day (TID) | ORAL | 0 refills | Status: AC | PRN

## 2024-04-09 MED ORDER — FLUCONAZOLE 150 MG PO TABS: 150.0000 mg | ORAL_TABLET | Freq: Once | ORAL | 0 refills | Status: AC

## 2024-04-09 NOTE — Patient Instructions (Signed)
 We are sorry you are not feeling well.  Please take Zithromax  Z-PAK 2 tabs day 1 followed by 1 tab days 2 through 5.  Take Tessalon  Perles 100 mg up to 3 times daily as needed for cough.  Also prescribed 1 dose of Diflucan  150 mg tablet to take if you develop Candida vaginitis while on antibiotics.  Rest and stay well-hydrated.  Call if not better in 5 to 7 days or sooner if worse.

## 2024-04-09 NOTE — Progress Notes (Signed)
   Subjective:    Patient ID: Crystal Wallace, female    DOB: 10-11-1975, 48 y.o.   MRN: [id number]  HPI Onset of  respiratory infection symptoms this past weekend with itchy throat and ears. Began to have  productive cough  with green sputum production on Monday December 1st.  Says she has some chest rattling. Does not have SOB or wheezing. No shaking chills. Had flu vaccine through employment. She has hx of  insulin  dependent diabetes mellitus. Her Dexcom  reading  this morning is 151. Her Hgb AIC in March was 5.9%.    Review of Systems no headache, nausea vomiting or shaking chills     Objective:   Physical Exam  VS reviewed.  Weight 277 pounds.  BMI 48.30 seen in NAD.  Blood pressure 140/80, temperature ,100.7 degrees pulse 88 and regular pulse oximetry on room air 96% looks slightly fatigued.  Skin: Warm and dry.  No cervical adenopathy.  TMs are clear.  Pharynx is slightly injected without exudate.  Neck is supple.  Chest clear to auscultation.       Assessment & Plan:   Acute lower respiratory infection-tested negative for flu and COVID-19  Type 2 diabetes mellitus monitored with Dexcom meter without complications.  Dr. Sam is her Endocrinologist.  Urine microalbumin obtained today.  Morbid obesity-BMI 48.30 weight 277 pounds  Essential hypertension-patient reports blood pressure to be stable at home.  She is a bit anxious and does not feel well today.  Plan: Patient tested negative for flu and COVID-19.  Patient will take Zithromax  Z-PAK 2 tabs day 1 followed by 1 tab days 2 through 5.  She says this usually works well for her.  She also was prescribed Tessalon  Perles 100 mg up to 3 times daily as needed for cough.  Also prescribed 1 dose of Diflucan  150 mg to take if she has Candida vaginitis while on antibiotics.

## 2024-04-09 NOTE — Telephone Encounter (Signed)
 FYI Only or Action Required?: FYI only for provider: UC advised.  Patient was last seen in primary care on 07/31/2023 by Perri Ronal PARAS, MD.  Called Nurse Triage reporting Cough.  Symptoms began several days ago.  Interventions attempted: OTC medications: Mucinex .  Symptoms are: unchanged.  Triage Disposition: See Physician Within 24 Hours  Patient/caregiver understands and will follow disposition?: Yes Reason for Disposition  Coughing up rusty-colored (reddish-brown) sputum  Answer Assessment - Initial Assessment Questions Patient reports taking Mucinex . Patient stated she works and won't be off until Monday. Advised patient due to symptoms, should be seen in next 24 hours, advising UC. Patient agreeable.  1. ONSET: When did the cough begin?      Couple days ago  2. SEVERITY: How bad is the cough today?      Kind of severe  3. SPUTUM: Describe the color of your sputum (e.g., none, dry cough; clear, white, yellow, green)     Dark colored  4. HEMOPTYSIS: Are you coughing up any blood? If Yes, ask: How much? (e.g., flecks, streaks, tablespoons, etc.)     Denies  5. DIFFICULTY BREATHING: Are you having difficulty breathing? If Yes, ask: How bad is it? (e.g., mild, moderate, severe)      Denies  6. FEVER: Do you have a fever? If Yes, ask: What is your temperature, how was it measured, and when did it start?     Denies  7. CARDIAC HISTORY: Do you have any history of heart disease? (e.g., heart attack, congestive heart failure)      Denies  8. LUNG HISTORY: Do you have any history of lung disease?  (e.g., pulmonary embolus, asthma, emphysema)     Denies  9. OTHER SYMPTOMS: Do you have any other symptoms? (e.g., runny nose, wheezing, chest pain)       Left ear itching really bad but has subsided  Protocols used: Cough - Acute Productive-A-AH  Copied from CRM #[id number]. Topic: Clinical - Red Word Triage >> Apr 09, 2024  9:33 AM Rosaria BRAVO wrote: Red  Word that prompted transfer to Nurse Triage: Dark colored phlegm, congestion, possible infection. Cough.

## 2024-04-10 LAB — MICROALBUMIN / CREATININE URINE RATIO
Creatinine, Urine: 98 mg/dL (ref 20–275)
Microalb Creat Ratio: 105 mg/g{creat} — ABNORMAL HIGH (ref <30)
Microalb, Ur: 10.3 mg/dL

## 2024-04-14 ENCOUNTER — Encounter: Payer: Self-pay | Admitting: Dietician

## 2024-04-14 ENCOUNTER — Encounter: Admitting: Dietician

## 2024-04-14 VITALS — Ht 63.5 in | Wt 275.0 lb

## 2024-04-14 DIAGNOSIS — Z794 Long term (current) use of insulin: Secondary | ICD-10-CM | POA: Insufficient documentation

## 2024-04-14 DIAGNOSIS — E119 Type 2 diabetes mellitus without complications: Secondary | ICD-10-CM | POA: Insufficient documentation

## 2024-04-14 NOTE — Progress Notes (Signed)
 Diabetes Self-Management Education  Visit Type: First/Initial  Appt. Start Time: 1000 Appt. End Time: 1115  04/14/2024  Ms. Crystal Wallace, identified by name and date of birth, is a 48 y.o. female with a diagnosis of Diabetes: Type 2.   ASSESSMENT  Patient is here today alone.  She went to the healthy weight and wellness program about 5 years ago and found this successful but regain then lost again 2.5 years ago when her father died and regained 10 lbs.  Referral:  Type 2 Diabetes  History includes:  Type 2 Diabetes (2014 and insulin  since 06/2022), HTN, dyslipidemia Medications include:  Mounjaro , Lantus  28 units q am, simvastatin , lasix , olmesartin, see list Labs:  A1C 6.4% 02/13/2024 increased from 5.9% 07/25/2023, eGFR  117 03/02/2023, Vitamin D  27 in 2019, Vitamin B-12 420 2019 Lipid Panel     Component Value Date/Time   CHOL 141 03/02/2023 1111   CHOL 179 12/06/2017 1036   TRIG 94 03/02/2023 1111   HDL 38 (L) 03/02/2023 1111   HDL 51 12/06/2017 1036   CHOLHDL 3.7 03/02/2023 1111   VLDL 28 05/19/2016 1128   LDLCALC 84 03/02/2023 1111   LABVLDL 28 12/06/2017 1036   CGM:  Dexcom G7  CGM Results from download:   % Time CGM active:   95 %   (Goal >70%)  Average glucose:   149 mg/dL for 14 days  Glucose management indicator:   6.9 %  Time in range (70-180 mg/dL):   86 %   (Goal >29%)  Time High (181-250 mg/dL):   13 %   (Goal < 74%)  Time Very High (>250 mg/dL):    1 %   (Goal < 5%)  Time Low (54-69 mg/dL):   0 %   (Goal <5%)  Time Very Low (<54 mg/dL):   0 %   (Goal <8%)  %CV (glucose variability)    20 %  (Goal <36%)     63.5 275 lbs 04/14/2024 290 lbs 2023  Patient lives with her daughter and mother.  She does the shopping and her mother cooks.  She states that her mother respects her dietary needs. Patient works for [employer] in [employer] - 12 hour shifts days - active job. Lactose intolerant Exercise currently only work related. Height 5' 3.5 (1.613 m),  weight 275 lb (124.7 kg). Body mass index is 47.95 kg/m.   Diabetes Self-Management Education - 04/14/24 1031       Visit Information   Visit Type First/Initial      Initial Visit   Diabetes Type Type 2    Date Diagnosed 2014    Are you currently following a meal plan? No    Are you taking your medications as prescribed? Yes      Health Coping   How would you rate your overall health? Good      Psychosocial Assessment   Patient Belief/Attitude about Diabetes Motivated to manage diabetes    What is the hardest part about your diabetes right now, causing you the most concern, or is the most worrisome to you about your diabetes?   Making healty food and beverage choices    Self-care barriers None    Self-management support Doctor's office    Other persons present Patient    Patient Concerns Nutrition/Meal planning;Healthy Lifestyle;Problem Solving    Special Needs None    Preferred Learning Style No preference indicated    Learning Readiness Ready    How often do you need to have someone  help you when you read instructions, pamphlets, or other written materials from your doctor or pharmacy? 1 - Never    What is the last grade level you completed in school? some college      Pre-Education Assessment   Patient understands the diabetes disease and treatment process. Needs Review    Patient understands incorporating nutritional management into lifestyle. Needs Review    Patient undertands incorporating physical activity into lifestyle. Needs Review    Patient understands using medications safely. Needs Review    Patient understands monitoring blood glucose, interpreting and using results Needs Review    Patient understands prevention, detection, and treatment of acute complications. Needs Review    Patient understands prevention, detection, and treatment of chronic complications. Needs Review    Patient understands how to develop strategies to address psychosocial issues. Needs  Review    Patient understands how to develop strategies to promote health/change behavior. Needs Review      Complications   Last HgB A1C per patient/outside source 6.4 %   02/13/2024   How often do you check your blood sugar? > 4 times/day    Fasting Blood glucose range (mg/dL) 29-870    Postprandial Blood glucose range (mg/dL) 869-820;819-799;>799    Number of hypoglycemic episodes per month 0    Number of hyperglycemic episodes ( >200mg /dL): Weekly    Can you tell when your blood sugar is high? Yes    What do you do if your blood sugar is high? nothing    Have you had a dilated eye exam in the past 12 months? No    Have you had a dental exam in the past 12 months? Yes    Are you checking your feet? No      Dietary Intake   Breakfast sausage, grits, egg    Snack (morning) none    Lunch hot dog on a bun with slaw and chili, mexican stree corn    Snack (afternoon) occasional seaweed    Dinner sweet potato, meat, vege OR salad with protein    Snack (evening) occasional seaweed, sugar free gummies, sunflower seeds    Beverage(s) water , sugar free frapoccino, sugar free flavor packets      Activity / Exercise   Activity / Exercise Type Light (walking / raking leaves)   work related     Patient Education   Previous Diabetes Education Yes    Disease Pathophysiology Definition of diabetes, type 1 and 2, and the diagnosis of diabetes    Healthy Eating Role of diet in the treatment of diabetes and the relationship between the three main macronutrients and blood glucose level;Plate Method;Information on hints to eating out and maintain blood glucose control.;Meal options for control of blood glucose level and chronic complications.    Being Active Role of exercise on diabetes management, blood pressure control and cardiac health.    Medications Reviewed patients medication for diabetes, action, purpose, timing of dose and side effects.    Monitoring Yearly dilated eye exam;Daily foot  exams;Taught/evaluated CGM (comment);Identified appropriate SMBG and/or A1C goals.    Acute complications Taught prevention, symptoms, and  treatment of hypoglycemia - the 15 rule.;Discussed and identified patients' prevention, symptoms, and treatment of hyperglycemia.    Chronic complications Relationship between chronic complications and blood glucose control;Assessed and discussed foot care and prevention of foot problems    Diabetes Stress and Support Identified and addressed patients feelings and concerns about diabetes;Worked with patient to identify barriers to care and solutions;Role of stress  on diabetes      Individualized Goals (developed by patient)   Nutrition General guidelines for healthy choices and portions discussed    Physical Activity 60 minutes per day;Exercise 3-5 times per week    Medications take my medication as prescribed    Monitoring  Consistenly use CGM    Problem Solving Eating Pattern;Addressing barriers to behavior change    Reducing Risk examine blood glucose patterns;do foot checks daily;treat hypoglycemia with 15 grams of carbs if blood glucose less than 70mg /dL      Post-Education Assessment   Patient understands the diabetes disease and treatment process. Demonstrates understanding / competency    Patient understands incorporating nutritional management into lifestyle. Comprehends key points    Patient undertands incorporating physical activity into lifestyle. Comprehends key points    Patient understands using medications safely. Demonstrates understanding / competency    Patient understands monitoring blood glucose, interpreting and using results Comprehends key points    Patient understands prevention, detection, and treatment of acute complications. Comprehends key points    Patient understands prevention, detection, and treatment of chronic complications. Comprehends key points    Patient understands how to develop strategies to address psychosocial  issues. Comprehends key points    Patient understands how to develop strategies to promote health/change behavior. Needs Review      Outcomes   Expected Outcomes Demonstrated interest in learning. Expect positive outcomes    Future DMSE PRN    Program Status Completed          Individualized Plan for Diabetes Self-Management Training:   Learning Objective:  Patient will have a greater understanding of diabetes self-management. Patient education plan is to attend individual and/or group sessions per assessed needs and concerns.   Plan:   Patient Instructions  Consider Vitamin D  supplement (I6X7) Eye exam - get an appointment  Plan:  Aim for 2-3 Carb Choices per meal (30-45 grams) +/- 1 either way  Include protein in moderation with your meals and snacks Consider reading food labels for Total Carbohydrate of foods Continue to stay active Consistently wear your Dexcom.  Check your sensor before and 2 hours after meals as well as when you wake up. Continue taking medication as directed by MD   Expected Outcomes:  Demonstrated interest in learning. Expect positive outcomes  Education material provided: ADA - How to Thrive: A Guide for Your Journey with Diabetes, Snack sheet, and Diabetes Resources  If problems or questions, patient to contact team via:  Phone  Future DSME appointment: PRN

## 2024-04-14 NOTE — Patient Instructions (Addendum)
 Consider Vitamin D  supplement (D3K2) Eye exam - get an appointment  Plan:  Aim for 2-3 Carb Choices per meal (30-45 grams) +/- 1 either way  Include protein in moderation with your meals and snacks Consider reading food labels for Total Carbohydrate of foods Continue to stay active Consistently wear your Dexcom.  Check your sensor before and 2 hours after meals as well as when you wake up. Continue taking medication as directed by MD

## 2024-04-21 ENCOUNTER — Other Ambulatory Visit: Payer: Self-pay | Admitting: Internal Medicine

## 2024-05-14 ENCOUNTER — Other Ambulatory Visit: Payer: Self-pay | Admitting: Internal Medicine

## 2024-08-12 ENCOUNTER — Ambulatory Visit: Admitting: Internal Medicine
